# Patient Record
Sex: Male | Born: 1990 | Race: White | Hispanic: No | Marital: Single | Smoking: Current every day smoker
Health system: Southern US, Community
[De-identification: ages and names within clinical notes are randomized; demographics above are authoritative.]

## PROBLEM LIST (undated history)

## (undated) ENCOUNTER — Emergency Department (HOSPITAL_COMMUNITY): Discharge status: Against Medical Advice | Payer: No Typology Code available for payment source

## (undated) DIAGNOSIS — Z789 Other specified health status: Secondary | ICD-10-CM

## (undated) DIAGNOSIS — F1120 Opioid dependence, uncomplicated: Secondary | ICD-10-CM

## (undated) HISTORY — PX: WISDOM TOOTH EXTRACTION: SHX21

---
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
## 2009-03-07 ENCOUNTER — Emergency Department (HOSPITAL_COMMUNITY): Admission: EM | Admit: 2009-03-07 | Discharge: 2009-03-07 | Payer: Self-pay | Admitting: Emergency Medicine

## 2009-07-08 ENCOUNTER — Emergency Department (HOSPITAL_COMMUNITY): Admission: EM | Admit: 2009-07-08 | Discharge: 2009-07-08 | Payer: Self-pay | Admitting: Emergency Medicine

## 2009-07-13 ENCOUNTER — Emergency Department (HOSPITAL_COMMUNITY): Admission: EM | Admit: 2009-07-13 | Discharge: 2009-07-13 | Payer: Self-pay | Admitting: Emergency Medicine

## 2009-12-24 ENCOUNTER — Emergency Department (HOSPITAL_COMMUNITY): Admission: EM | Admit: 2009-12-24 | Discharge: 2009-12-24 | Payer: Self-pay | Admitting: Emergency Medicine

## 2009-12-30 ENCOUNTER — Emergency Department (HOSPITAL_COMMUNITY): Admission: EM | Admit: 2009-12-30 | Discharge: 2009-12-30 | Payer: Self-pay | Admitting: Emergency Medicine

## 2010-01-12 ENCOUNTER — Ambulatory Visit (HOSPITAL_BASED_OUTPATIENT_CLINIC_OR_DEPARTMENT_OTHER): Admission: RE | Admit: 2010-01-12 | Discharge: 2010-01-12 | Payer: Self-pay | Admitting: Orthopaedic Surgery

## 2010-02-28 ENCOUNTER — Emergency Department (HOSPITAL_COMMUNITY): Admission: EM | Admit: 2010-02-28 | Discharge: 2010-02-28 | Payer: Self-pay | Admitting: Emergency Medicine

## 2010-03-04 ENCOUNTER — Emergency Department (HOSPITAL_COMMUNITY): Admission: EM | Admit: 2010-03-04 | Discharge: 2010-03-04 | Payer: Self-pay | Admitting: Emergency Medicine

## 2010-06-06 ENCOUNTER — Emergency Department (HOSPITAL_COMMUNITY): Admission: EM | Admit: 2010-06-06 | Discharge: 2010-06-06 | Payer: Self-pay | Admitting: Emergency Medicine

## 2010-06-11 ENCOUNTER — Emergency Department (HOSPITAL_COMMUNITY): Admission: EM | Admit: 2010-06-11 | Discharge: 2010-06-11 | Payer: Self-pay | Admitting: Family Medicine

## 2010-10-21 ENCOUNTER — Emergency Department (HOSPITAL_COMMUNITY)
Admission: EM | Admit: 2010-10-21 | Discharge: 2010-10-21 | Payer: Self-pay | Source: Home / Self Care | Admitting: Emergency Medicine

## 2010-10-28 ENCOUNTER — Emergency Department (HOSPITAL_COMMUNITY)
Admission: EM | Admit: 2010-10-28 | Discharge: 2010-10-28 | Payer: Self-pay | Source: Home / Self Care | Admitting: Emergency Medicine

## 2010-11-01 LAB — GC/CHLAMYDIA PROBE AMP, GENITAL
Chlamydia, DNA Probe: NEGATIVE
GC Probe Amp, Genital: POSITIVE — AB

## 2010-11-01 LAB — RPR: RPR Ser Ql: NONREACTIVE

## 2011-01-09 LAB — POCT HEMOGLOBIN-HEMACUE: Hemoglobin: 16.2 g/dL (ref 13.0–17.0)

## 2011-03-04 NOTE — Op Note (Signed)
NAMEBLANDON, OFFERDAHL              ACCOUNT NO.:  [id number]   MEDICAL RECORD NO.:  [id number]          PATIENT TYPE:  EMS   LOCATION:  ED                           FACILITY:  [facility]   PHYSICIAN:  Dionne Ano. Gramig III, M.D.DATE OF BIRTH:  June 20, 1991   DATE OF PROCEDURE:  DATE OF DISCHARGE:                               OPERATIVE REPORT   __________  I had the pleasure to see Gerald Oliver upon the referal  from Dr. Doug Sou, emergency room staff [facility]. This  patient is a 20 year old male who sustained a laceration to his left  hand today, October 13, 2006.  The patient sustained a significant  laceration to the dorsal aspect of his left index finger sustaining an  extensor tendon injury and periosteal injury down into the joint.  He  complained of pain and discomfort.  Following this, he was evaluated,  given a tetanus shot as well as Ancef 1 gram IV and further care.  He  has an obvious extensor tendon disruption.   PAST MEDICAL HISTORY:  None.   PAST SURGICAL HISTORY:  Facial surgery.   CURRENT MEDICATIONS:  None.   ALLERGIES:  None.   SOCIAL HISTORY:  The patient is 20 years of age, he goes to school at  [employer].   PHYSICAL EXAMINATION:  He is alert and oriented in no acute distress.  Vital signs are stable.  Chest and abdomen are nontender and normal. The patient has no  tenderness over his C-spine, T-spine or L-spine and he walks with a  nonantalgic gait. The left hand is nontender at the elbow, humerus and  shoulder. His left index finger has an open laceration down to the  joint, the joint is exposed but there is no broken bones noted.  Extensor tendon is disrupted and there is a large flap tear about the  dorsal aspect of his hand.  I viewed this at length.   He has had no obvious bony fracture I should note.   IMPRESSION:  Open MCP joint with extensor tendon disruption and large  dorsal flap tearing injury to the skin.   PLAN:  I verbally  consented him for I&D and repair of structure as  necessary.   He understands the risk of bleeding, infection, anesthesia, damage to  normal structures and failure of the surgery to accomplish its intended  goals of relieving symptoms and restoring function.  With this in mind,  we will proceed accordingly.   PREOPERATIVE DIAGNOSIS:  Extensive laceration left index finger with  dorsal jagged flap, open metacarpophalangeal joint and extensor tendon  disruption.   POSTOPERATIVE DIAGNOSIS:  Extensive laceration left index finger with  dorsal jagged flap, open metacarpophalangeal joint and extensor tendon  disruption.   PROCEDURE:  1. I&D skin, subcutaneous tissue, tendon, periosteal tissue and MCP      joint as well as tendon tissue.  This was an excisional      debridement.  2. Open arthrotomy and debridement of the MCP joint with synovectomy      as necessary.  3. Repair extensor tendon laceration at the MCP  joint with 4-0      FiberWire, left index finger.  4. Complex flap repair/mobilization with rotation flap maneuver left      index finger.   SURGEON:  Dionne Ano. Amanda Pea, M.D.   ASSISTANT:  None.   COMPLICATIONS:  None.   ANESTHESIA:  Intermetacarpal block.   TOURNIQUET TIME:  Zero.   INDICATIONS FOR PROCEDURE:  The patient has been thoroughly evaluated,  the risks and benefits of surgery discussed and he desires to proceed.  He has an open laceration with tendon disruption and an open MCP joint.  He understands the risks and benefits of surgery and desires to proceed.   OPERATIVE PROCEDURE:  The patient was seen by myself and taken to the  procedure suite.  He was consented. He underwent a sterile prep and  drape with Betadine scrub and paint x3 separate scrubs, 3 liters of  irrigation was placed in the wound.  I should note that lidocaine 1%  without epinephrine was used __________  intermetacarpal block for  anesthesia.  Once I&D and prep were performed, I then  performed  excisional debridement of skin, subcutaneous tissue, periosteal tissue,  tendon and MCP joint tissue.  This was an incisional debridement and was  irrigated copiously.   Following this, the MCP joint was opened, limited synovectomy and  arthrotomy was accomplished which revealed no injury to the  cartilaginous tissue.  He was thoroughly irrigated, debrided and wound  was cleansed about the MCP joint to prevent infection.  This was copious  I&D.   Following this, the patient underwent repair of the extensor apparatus  with 4-0 FiberWire. He had an 8-10 strand repair without difficulty.  Following the repair with 4-0 FiberWire about the MCP joint region, I  then placed him through a full range of motion and demonstrated full  competent tendon reconstruction.  There was no subluxation or problems.   Following this, I obtained hemostasis with chromic suture tying off a  very large dorsal vein.  I then performed rotation flap with advancement  of the very large dorsal flap into the defect. The skin edges were  trimmed, mobilized and the rotation flap was inset due to the very large  and significant injury to the hand.   Following repair of the flap, he was then splinted with gauze and  sterile dressing with Xeroform and gauze were placed.  He tolerated this  well.   Next the patient had a fiberglass cast placed with the MCPs at 10  degrees of flexion, the wrist in extension and the PIPs mobilized in  extension.   He will be discharged home on Keflex as well as Vicodin p.r.n. pain with  precautions.  I have asked him not to drink alcohol, drive a car or make  important decisions on pain medicine. I have discussed with him Peri-  Colace use and will see him back in the office in 10-14 days for follow-  up for suture removal.  We will plan for 4 weeks of mobilization an gradual interval range of motion and therapy through [facility]  system.  I have discussed with the  patient the do's and don't's, etc.  and proper follow-up plans etc. All questions have been encouraged and  answered.           ______________________________  Dionne Ano. Everlene Other, M.D.     Nash Mantis  D:  10/13/2006  T:  10/14/2006  Job:  [id number]

## 2011-07-28 LAB — I-STAT 8, (EC8 V) (CONVERTED LAB)
Acid-base deficit: 2
BUN: 4 — ABNORMAL LOW
Bicarbonate: 24.8 — ABNORMAL HIGH
Chloride: 106
Glucose, Bld: 90
HCT: 51 — ABNORMAL HIGH
Hemoglobin: 17.3 — ABNORMAL HIGH
Operator id: 146091
Potassium: 4.1
Sodium: 139
TCO2: 26
pCO2, Ven: 50.1 — ABNORMAL HIGH
pH, Ven: 7.302 — ABNORMAL HIGH

## 2011-07-28 LAB — RAPID URINE DRUG SCREEN, HOSP PERFORMED
Amphetamines: NOT DETECTED
Barbiturates: NOT DETECTED
Benzodiazepines: POSITIVE — AB
Cocaine: NOT DETECTED
Opiates: NOT DETECTED
Tetrahydrocannabinol: POSITIVE — AB

## 2011-07-28 LAB — DRUG SCREEN PANEL (SERUM)
Amphetamine Scrn: NEGATIVE
Barbiturate Scrn: NEGATIVE
Benzodiazepine Scrn: NEGATIVE
Cannabinoid, Blood: POSITIVE
Cocaine (Metabolite): NEGATIVE
Methadone (Dolophine), Serum: NEGATIVE
Opiates, Blood: NEGATIVE
Phencyclidine, Serum: NEGATIVE
Propoxyphene,Serum: NEGATIVE

## 2011-07-28 LAB — CBC
HCT: 47.1
Hemoglobin: 16.2 — ABNORMAL HIGH
MCHC: 34.4
MCV: 84
Platelets: 255
RBC: 5.61
RDW: 13
WBC: 11 — ABNORMAL HIGH

## 2011-07-28 LAB — DIFFERENTIAL
Basophils Absolute: 0.1
Basophils Relative: 1
Eosinophils Absolute: 0.1
Eosinophils Relative: 1
Lymphocytes Relative: 13 — ABNORMAL LOW
Lymphs Abs: 1.4
Monocytes Absolute: 0.5
Monocytes Relative: 5
Neutro Abs: 8.9 — ABNORMAL HIGH
Neutrophils Relative %: 81 — ABNORMAL HIGH

## 2011-07-28 LAB — ETHANOL: Alcohol, Ethyl (B): <5

## 2011-07-28 LAB — ACETAMINOPHEN LEVEL: Acetaminophen (Tylenol), Serum: <10 — ABNORMAL LOW

## 2011-07-28 LAB — HEPATIC FUNCTION PANEL
ALT: 25
AST: 27
Albumin: 4.6
Alkaline Phosphatase: 99
Bilirubin, Direct: 0.2
Indirect Bilirubin: 1.4 — ABNORMAL HIGH
Total Bilirubin: 1.6 — ABNORMAL HIGH
Total Protein: 8

## 2011-07-28 LAB — SALICYLATE LEVEL: Salicylate Lvl: <4

## 2011-07-28 LAB — POCT I-STAT CREATININE
Creatinine, Ser: 1
Operator id: 146091

## 2011-12-14 ENCOUNTER — Emergency Department (HOSPITAL_COMMUNITY)
Admission: EM | Admit: 2011-12-14 | Discharge: 2011-12-14 | Disposition: A | Discharge status: Home / Self Care | Payer: Self-pay | Attending: Emergency Medicine | Admitting: Emergency Medicine

## 2011-12-14 ENCOUNTER — Encounter (HOSPITAL_COMMUNITY): Payer: Self-pay | Admitting: *Deleted

## 2011-12-14 ENCOUNTER — Emergency Department (HOSPITAL_COMMUNITY): Payer: Self-pay

## 2011-12-14 DIAGNOSIS — S61409A Unspecified open wound of unspecified hand, initial encounter: Secondary | ICD-10-CM | POA: Insufficient documentation

## 2011-12-14 DIAGNOSIS — S60221A Contusion of right hand, initial encounter: Secondary | ICD-10-CM

## 2011-12-14 DIAGNOSIS — W268XXA Contact with other sharp object(s), not elsewhere classified, initial encounter: Secondary | ICD-10-CM | POA: Insufficient documentation

## 2011-12-14 DIAGNOSIS — F17200 Nicotine dependence, unspecified, uncomplicated: Secondary | ICD-10-CM | POA: Insufficient documentation

## 2011-12-14 DIAGNOSIS — S60229A Contusion of unspecified hand, initial encounter: Secondary | ICD-10-CM | POA: Insufficient documentation

## 2011-12-14 DIAGNOSIS — S61411A Laceration without foreign body of right hand, initial encounter: Secondary | ICD-10-CM

## 2011-12-14 MED ORDER — IBUPROFEN 800 MG PO TABS: 800.0000 mg | ORAL_TABLET | Freq: Three times a day (TID) | ORAL | Status: AC | PRN

## 2011-12-14 NOTE — ED Notes (Signed)
Pt's right hand placed in sterile saline to soak for further exploration.

## 2011-12-14 NOTE — ED Notes (Signed)
Pt denies any questions upon discharge. 

## 2011-12-14 NOTE — Discharge Instructions (Signed)
Return to the ER immediately if you develop redness, swelling, pus draining from the wound, or fevers greater than 100.4.  You should be seen by your doctor, the urgent care, or the ER in 10-14 days for a wound recheck and suture removal.  You may return to the ER at any time for worsening condition or any new symptoms that concern you.    Contusion (Bruise) of Hand An injury to the hand may cause bruises (contusions). Contusions are caused by bleeding from small blood vessels (capillaries) that allow blood to leak out into the muscles, tendons, and surrounding soft tissue. This is followed by swelling and pain (inflammation). Contusions of the hand are common because of the use of hands in daily and recreational activities. Signs of a hand injury include pain, swelling, and a color change. Initially the skin may turn blue to purple in color. As the bruise ages, the color turns yellow and orange. Swelling may decrease the movement of the fingers. Contusions are seen more commonly with:  Contact sports (especially in football, wrestling, and basketball).   Use of medications that thin the blood (anticoagulants).   Use of aspirin and nonsteroidal anti-inflammatory agents that decrease the ability of the blood to clot.   Vitamin deficiencies.   Aging.  DIAGNOSIS  Diagnosis of hand injuries can be made by your own observation. If problems continue, a caregiver may be required for further evaluation and treatment. X-rays may be required to make sure there are no broken bones (fractures). Continued problems may require physical therapy for treatment. RISKS AND COMPLICATIONS  Extensive bleeding and tissue inflammation. This can lead to disability and arthritis-type problems later on if the hand does not heal properly.   Infection of the hand if there are breaks in the skin. This is especially true if the hand injury came from someone's teeth, such as would occur with punching someone in the mouth.  This can lead to an infection of the tendons and the membranes surrounding the tendons (sheaths). This infection can have severe complications including a loss of function (a "frozen" hand).   Rupture of the tendons requiring a surgical repair. Failure to repair the tendons can result in loss of function of the hand or fingers.  HOME CARE INSTRUCTIONS   Apply ice to the injury for 15 to 20 minutes, 3 to 4 times per day. Put the ice in a plastic bag and place a towel between the bag of ice and your skin.   An elastic bandage may be used initially for support and to minimize swelling. Do not wrap the hand too tightly. Do not sleep with the elastic bandage on.   Gentle massage from the fingertips towards the elbow will help keep the swelling down. Gently open and close your fist while doing this to maintain range of motion. Do this only after the first few days, when there is no or minimal pain.   Keep your hand above the level of the heart when swelling and pain are present. This will allow the fluid to drain out of the hand, decreasing the amount of swelling. This will improve healing time.   Try to avoid use of the injured hand (except for gentle range of motion) while the hand is hurting. Do not resume use until instructed by your caregiver. Then begin use gradually, do not increase use to the point of pain. If pain does develop, decrease use and continue the above measures, gradually increasing activities that do not cause  discomfort until you achieve normal use.   Only take over-the-counter or prescription medicines for pain, discomfort, or fever as directed by your caregiver.   Follow up with your caregiver as directed. Follow-up care may include orthopedic referrals, physical therapy, and rehabilitation. Any delay in obtaining necessary care could result in delayed healing, or temporary or permanent disability.  REHABILITATION  Begin daily rehabilitation exercises when an elastic bandage is  no longer needed and you are either pain free or only have minimal pain.   Use ice massage for 10 minutes before and after workouts. Put ice in a plastic bag and place a towel between the bag of ice and your skin. Massage the injured area with the ice pack.  SEEK IMMEDIATE MEDICAL CARE IF:   Your pain and swelling increase, or pain is uncontrolled with medications.   You have loss of feeling in your hand, or your hand turns cold or blue.   An oral temperature above 102 F (38.9 C) develops, not controlled by medication.   Your hand becomes warm to the touch, or you have increased pain with even slight movement of your fingers.   Your hand does not begin to improve in 1 or 2 days.   The skin is broken and signs of infection occur (fluid draining from the contusion, increasing pain, fever, headache, muscle aches, dizziness, or a general ill feeling).   You develop new, unexplained problems, or an increase of the symptoms that brought you to your caregiver.  MAKE SURE YOU:   Understand these instructions.   Will watch your condition.   Will get help right away if you are not doing well or get worse.  Document Released: 03/25/2002 Document Revised: 06/15/2011 Document Reviewed: 03/12/2010 [facility] Patient Information 2012 [place], [place].Laceration Care, Adult A laceration is a cut or lesion that goes through all layers of the skin and into the tissue just beneath the skin. TREATMENT  Some lacerations may not require closure. Some lacerations may not be able to be closed due to an increased risk of infection. It is important to see your caregiver as soon as possible after an injury to minimize the risk of infection and maximize the opportunity for successful closure. If closure is appropriate, pain medicines may be given, if needed. The wound will be cleaned to help prevent infection. Your caregiver will use stitches (sutures), staples, wound glue (adhesive), or skin adhesive strips to  repair the laceration. These tools bring the skin edges together to allow for faster healing and a better cosmetic outcome. However, all wounds will heal with a scar. Once the wound has healed, scarring can be minimized by covering the wound with sunscreen during the day for 1 full year. HOME CARE INSTRUCTIONS  For sutures or staples:  Keep the wound clean and dry.   If you were given a bandage (dressing), you should change it at least once a day. Also, change the dressing if it becomes wet or dirty, or as directed by your caregiver.   Wash the wound with soap and water 2 times a day. Rinse the wound off with water to remove all soap. Pat the wound dry with a clean towel.   After cleaning, apply a thin layer of the antibiotic ointment as recommended by your caregiver. This will help prevent infection and keep the dressing from sticking.   You may shower as usual after the first 24 hours. Do not soak the wound in water until the sutures are removed.  Only take over-the-counter or prescription medicines for pain, discomfort, or fever as directed by your caregiver.   Get your sutures or staples removed as directed by your caregiver.  For skin adhesive strips:  Keep the wound clean and dry.   Do not get the skin adhesive strips wet. You may bathe carefully, using caution to keep the wound dry.   If the wound gets wet, pat it dry with a clean towel.   Skin adhesive strips will fall off on their own. You may trim the strips as the wound heals. Do not remove skin adhesive strips that are still stuck to the wound. They will fall off in time.  For wound adhesive:  You may briefly wet your wound in the shower or bath. Do not soak or scrub the wound. Do not swim. Avoid periods of heavy perspiration until the skin adhesive has fallen off on its own. After showering or bathing, gently pat the wound dry with a clean towel.   Do not apply liquid medicine, cream medicine, or ointment medicine to your  wound while the skin adhesive is in place. This may loosen the film before your wound is healed.   If a dressing is placed over the wound, be careful not to apply tape directly over the skin adhesive. This may cause the adhesive to be pulled off before the wound is healed.   Avoid prolonged exposure to sunlight or tanning lamps while the skin adhesive is in place. Exposure to ultraviolet light in the first year will darken the scar.   The skin adhesive will usually remain in place for 5 to 10 days, then naturally fall off the skin. Do not pick at the adhesive film.  You may need a tetanus shot if:  You cannot remember when you had your last tetanus shot.   You have never had a tetanus shot.  If you get a tetanus shot, your arm may swell, get red, and feel warm to the touch. This is common and not a problem. If you need a tetanus shot and you choose not to have one, there is a rare chance of getting tetanus. Sickness from tetanus can be serious. SEEK MEDICAL CARE IF:   You have redness, swelling, or increasing pain in the wound.   You see a red line that goes away from the wound.   You have yellowish-white fluid (pus) coming from the wound.   You have a fever.   You notice a bad smell coming from the wound or dressing.   Your wound breaks open before or after sutures have been removed.   You notice something coming out of the wound such as wood or glass.   Your wound is on your hand or foot and you cannot move a finger or toe.  SEEK IMMEDIATE MEDICAL CARE IF:   Your pain is not controlled with prescribed medicine.   You have severe swelling around the wound causing pain and numbness or a change in color in your arm, hand, leg, or foot.   Your wound splits open and starts bleeding.   You have worsening numbness, weakness, or loss of function of any joint around or beyond the wound.   You develop painful lumps near the wound or on the skin anywhere on your body.  MAKE SURE YOU:     Understand these instructions.   Will watch your condition.   Will get help right away if you are not doing well or get worse.  Document Released: 10/03/2005 Document Revised: 06/15/2011 Document Reviewed: 03/29/2011 [facility] Patient Information 2012 [place], [place].

## 2011-12-14 NOTE — ED Notes (Signed)
Pt reports hitting right hand on object last night and has abrasion and swelling to right middle knuckle.

## 2011-12-14 NOTE — ED Provider Notes (Signed)
History     CSN: [id number]  Arrival date & time 12/14/11  1621   First MD Initiated Contact with Patient 12/14/11 1709      Chief Complaint  Patient presents with  . Hand Injury    (Consider location/radiation/quality/duration/timing/severity/associated sxs/prior treatment) HPI Comments: Patient reports that last night he was "swinging (his) hands around" and hit a light fixture.  States the lightbulb broke and cut his hand.  Patient has since had increased pain and swelling in his right third knuckle.  Denies weakness, numbness, tingling of the fingers. Denies other injury.    The history is provided by the patient.    History reviewed. No pertinent past medical history.  History reviewed. No pertinent past surgical history.  History reviewed. No pertinent family history.  History  Substance Use Topics  . Smoking status: Current Everyday Smoker    Types: Cigarettes  . Smokeless tobacco: Not on file  . Alcohol Use: Yes      Review of Systems  Musculoskeletal: Negative for back pain.  Neurological: Negative for syncope, weakness, numbness and headaches.  All other systems reviewed and are negative.    Allergies  Review of patient's allergies indicates no known allergies.  Home Medications  No current outpatient prescriptions on file.  BP 133/80  Pulse 86  Temp(Src) 97.5 F (36.4 C) (Oral)  Resp 16  SpO2 97%  Physical Exam  Nursing note and vitals reviewed. Constitutional: He is oriented to person, place, and time. He appears well-developed and well-nourished.  HENT:  Head: Normocephalic and atraumatic.  Neck: Neck supple.  Cardiovascular: Normal rate, regular rhythm and normal heart sounds.   Pulmonary/Chest: Breath sounds normal. No respiratory distress. He has no wheezes. He has no rales. He exhibits no tenderness.  Abdominal: Soft. Bowel sounds are normal. There is no tenderness.  Musculoskeletal:       Hands:      Capilllary refill < 2 seconds,  pt with full extension and flexion of fingers, strength 5/5, sensation intact.   Neurological: He is alert and oriented to person, place, and time.    ED Course  Procedures (including critical care time)  Labs Reviewed - No data to display Dg Hand Complete Right  12/14/2011  *RADIOLOGY REPORT*  Clinical Data: Laceration of the hand with glass.  Pain and swelling.  RIGHT HAND - COMPLETE 3+ VIEW  Comparison: 02/28/2010  Findings: Healed fourth and fifth metacarpal fractures noted.  Dorsal soft tissue swelling noted at the level of the metacarpal phalangeal joints. A thin foreign body measuring 1 mm in length is present along the superficial soft tissues in the vicinity of this swelling, and could represent a tiny superficial glass shard superficial to the third MCP joint dorsally.  IMPRESSION:  1.  Faint 1 mm in length density along the superficial soft tissues dorsal to the third MCP joint may represent a tiny sliver of glass.  Original Report Authenticated By: Dellia Cloud, M.D.   Filed Vitals:   12/14/11 1750  BP: 133/80  Pulse: 86  Temp:   Resp:     LACERATION REPAIR Performed by: Rise Patience supervising Emilia Beck, PA-S Consent: Verbal consent obtained. Risks and benefits: risks, benefits and alternatives were discussed Patient identity confirmed: provided demographic data Time out performed prior to procedure Prepped and Draped in normal sterile fashion Wound explored  Laceration Location: right third MCP, dorsal aspect  Laceration Length: 1.5cm  No Foreign Bodies seen or palpated, patient's hand cleaned thoroughly with lavage followed  be extensive irrigation with normal saline  Anesthesia: local infiltration  Local anesthetic: lidocaine 2% no epinephrine  Anesthetic total: 4 ml  Irrigation method: syringe Amount of cleaning: standard  Skin closure: 5-0 prolene  Number of sutures or staples: 3  Technique: simple interrupted  Patient tolerance:  Patient tolerated the procedure well with no immediate complications.     1. Laceration of right hand   2. Contusion of right hand       MDM  Patient with injury to his right hand yesterday on glass object.  Xray showed 1mm glass piece.  Nurse states she saw it clearly prior to patient soaking hand, I did not see or palpate any foreign body on exam.  Patient advised that there may be a small retained foreign body that may need excision at some point in the future, though I believe we most likely got it out with lavage and irrigation. Small laceration repaired in ED.  Pt d/c home with precautions, reasons for immediate return.  Pt to return in 10-14 days for wound check and suture removal.  Patient verbalizes understanding and agrees with plan.            Dillard Cannon [place], [place] 12/14/11 2254

## 2011-12-15 NOTE — ED Provider Notes (Signed)
Medical screening examination/treatment/procedure(s) were performed by non-physician practitioner and as supervising physician I was immediately available for consultation/collaboration.  Arantza Darrington, MD 12/15/11 0705 

## 2012-04-11 ENCOUNTER — Emergency Department (HOSPITAL_COMMUNITY): Payer: Self-pay

## 2012-04-11 ENCOUNTER — Emergency Department (HOSPITAL_COMMUNITY)
Admission: EM | Admit: 2012-04-11 | Discharge: 2012-04-11 | Disposition: A | Discharge status: Home / Self Care | Payer: Self-pay | Attending: Emergency Medicine | Admitting: Emergency Medicine

## 2012-04-11 DIAGNOSIS — F17200 Nicotine dependence, unspecified, uncomplicated: Secondary | ICD-10-CM | POA: Insufficient documentation

## 2012-04-11 DIAGNOSIS — Y92410 Unspecified street and highway as the place of occurrence of the external cause: Secondary | ICD-10-CM | POA: Insufficient documentation

## 2012-04-11 DIAGNOSIS — IMO0002 Reserved for concepts with insufficient information to code with codable children: Secondary | ICD-10-CM | POA: Insufficient documentation

## 2012-04-11 DIAGNOSIS — F1010 Alcohol abuse, uncomplicated: Secondary | ICD-10-CM | POA: Insufficient documentation

## 2012-04-11 DIAGNOSIS — T148XXA Other injury of unspecified body region, initial encounter: Secondary | ICD-10-CM

## 2012-04-11 DIAGNOSIS — F10929 Alcohol use, unspecified with intoxication, unspecified: Secondary | ICD-10-CM

## 2012-04-11 NOTE — Discharge Instructions (Signed)
Alcohol Intoxication You have alcohol intoxication when the amount of alcohol that you have consumed has impaired your ability to mentally and physically function. There are a variety of factors that contribute to the level at which alcohol intoxication can occur, such as age, gender, weight, frequency of alcohol consumption, medication use, and the presence of other medical conditions, such as diabetes, seizures, or heart conditions. The blood alcohol level test measures the concentration of alcohol in your blood. In most states, your blood alcohol level must be lower than 80 mg/dL (2.95%) to legally drive. However, many dangerous effects of alcohol can occur at much lower levels. Alcohol directly impairs the normal chemical activity of the brain and is said to be a chemical depressant. Alcohol can cause drowsiness, stupor, respiratory failure, and coma. Other physical effects can include headache, vomiting, vomiting of blood, abdominal pain, a fast heartbeat, difficulty breathing, anxiety, and amnesia. Alcohol intoxication can also lead to dangerous and life-threatening activities, such as fighting, dangerous operation of vehicles or heavy machinery, and risky sexual behavior. Alcohol can be especially dangerous when taken with other drugs. Some of these drugs are:  Sedatives.   Painkillers.   Marijuana.   Tranquilizers.   Antihistamines.   Muscle relaxants.   Seizure medicine.  Many of the effects of acute alcohol intoxication are temporary. However, repeated alcohol intoxication can lead to severe medical illnesses. If you have alcohol intoxication, you should:  Stay hydrated. Drink enough water and fluids to keep your urine clear or pale yellow. Avoid excessive caffeine because this can further lead to dehydration.   Eat a healthy diet. You may have residual nausea, headache, and loss of appetite, but it is still important that you maintain good nutrition. You can start with clear  liquids.   Take nonsteroidal anti-inflammatory medications as needed for headaches, but make sure to do so with small meals. You should avoid acetaminophen for several days after having alcohol intoxication because the combination of alcohol and acetaminophen can be toxic to your liver.  If you have frequent alcohol intoxication, ask your friends and family if they think you have a drinking problem. For further help, contact:  Your caregiver.   Alcoholics Anonymous (AA).   A drug or alcohol rehabilitation program.  SEEK MEDICAL CARE IF:   You have persistent vomiting.   You have persistent pain in any part of your body.   You do not feel better after a few days.  SEEK IMMEDIATE MEDICAL CARE IF:   You become shaky or tremble when you try to stop drinking.   You shake uncontrollably (seizure).   You throw up (vomit) blood. This may be bright red or it may look like black coffee grounds.   You have blood in the stool. This may be bright red or appear as a black, tarry, bad smelling stool.   You become lightheaded or faint.  ANY OF THESE SYMPTOMS MAY REPRESENT A SERIOUS PROBLEM THAT IS AN EMERGENCY. Do not wait to see if the symptoms will go away. Get medical help right away. Call your local emergency services (911 in [place] DO NOT drive yourself to the hospital. MAKE SURE YOU:   Understand these instructions.   Will watch your condition.   Will get help right away if you are not doing well or get worse.  Document Released: 07/13/2005 Document Revised: 09/22/2011 Document Reviewed: 03/22/2010 [facility] Patient Information 2012 [place], [place].Abrasions An abrasion is a scraped area on the skin. Abrasions do not go through all  layers of the skin.  HOME CARE  Change any bandages (dressings) as told by your doctor. If the bandage sticks, soak it off with warm, soapy water. Change the bandage if it gets wet, dirty, or starts to smell.   Wash the area with soap and water twice a day.  Rinse off the soap. Pat the area dry with a clean towel.   Look at the injured area for signs of infection. Infection signs include redness, puffiness (swelling), tenderness, or yellowish white fluid (pus) coming from the wound.   Apply medicated cream as told by your doctor.   Only take medicine as told by your doctor.   Follow up with your doctor as told.  GET HELP RIGHT AWAY IF:   You have more pain in your wound.   You have redness, puffiness (swelling), or tenderness around your wound.   You have yellowish white fluid (pus) coming from your wound.   You have a fever.   A bad smell is coming from the wound or bandage.  MAKE SURE YOU:   Understand these instructions.   Will watch your condition.   Will get help right away if you are not doing well or get worse.  Document Released: 03/21/2008 Document Revised: 09/22/2011 Document Reviewed: 09/06/2011 [facility] Patient Information 2012 [place], [place].
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
## 2012-04-11 NOTE — ED Provider Notes (Signed)
I saw and evaluated the patient, reviewed the resident's note and I agree with the findings and plan.   Kazuma Elena, MD 04/11/12 2352 

## 2012-06-14 ENCOUNTER — Encounter (HOSPITAL_COMMUNITY): Payer: Self-pay | Admitting: *Deleted

## 2012-06-14 ENCOUNTER — Emergency Department (HOSPITAL_COMMUNITY): Payer: No Typology Code available for payment source

## 2012-06-14 ENCOUNTER — Emergency Department (HOSPITAL_COMMUNITY)
Admission: EM | Admit: 2012-06-14 | Discharge: 2012-06-15 | Disposition: A | Discharge status: Home / Self Care | Payer: No Typology Code available for payment source | Attending: Emergency Medicine | Admitting: Emergency Medicine

## 2012-06-14 DIAGNOSIS — S161XXA Strain of muscle, fascia and tendon at neck level, initial encounter: Secondary | ICD-10-CM

## 2012-06-14 DIAGNOSIS — S139XXA Sprain of joints and ligaments of unspecified parts of neck, initial encounter: Secondary | ICD-10-CM | POA: Insufficient documentation

## 2012-06-14 DIAGNOSIS — M542 Cervicalgia: Secondary | ICD-10-CM | POA: Insufficient documentation

## 2012-06-14 DIAGNOSIS — M545 Low back pain, unspecified: Secondary | ICD-10-CM | POA: Insufficient documentation

## 2012-06-14 DIAGNOSIS — Y92410 Unspecified street and highway as the place of occurrence of the external cause: Secondary | ICD-10-CM | POA: Insufficient documentation

## 2012-06-14 DIAGNOSIS — F17200 Nicotine dependence, unspecified, uncomplicated: Secondary | ICD-10-CM | POA: Insufficient documentation
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
## 2012-06-15 ENCOUNTER — Emergency Department (HOSPITAL_COMMUNITY): Payer: No Typology Code available for payment source

## 2012-06-15 MED ORDER — DIAZEPAM 5 MG PO TABS
5.0000 mg | ORAL_TABLET | Freq: Once | ORAL | Status: AC
  Administered 2012-06-15: 5 mg via ORAL
  Filled 2012-06-15: qty 1

## 2012-06-15 MED ORDER — KETOROLAC TROMETHAMINE 60 MG/2ML IM SOLN
60.0000 mg | Freq: Once | INTRAMUSCULAR | Status: AC
  Administered 2012-06-15: 60 mg via INTRAMUSCULAR
  Filled 2012-06-15: qty 2

## 2012-06-15 MED ORDER — OXYCODONE-ACETAMINOPHEN 5-325 MG PO TABS
1.0000 | ORAL_TABLET | Freq: Once | ORAL | Status: AC
  Administered 2012-06-15: 1 via ORAL
  Filled 2012-06-15: qty 1

## 2012-06-15 MED ORDER — NAPROXEN 500 MG PO TABS: 500.0000 mg | ORAL_TABLET | Freq: Two times a day (BID) | ORAL | Status: DC

## 2012-06-15 MED ORDER — CYCLOBENZAPRINE HCL 10 MG PO TABS: 10.0000 mg | ORAL_TABLET | Freq: Two times a day (BID) | ORAL | Status: AC | PRN
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
## 2012-06-15 NOTE — ED Notes (Signed)
Patient transported to X-ray 

## 2012-07-10 ENCOUNTER — Encounter (HOSPITAL_COMMUNITY): Payer: Self-pay | Admitting: Emergency Medicine

## 2012-07-10 ENCOUNTER — Emergency Department (HOSPITAL_COMMUNITY): Payer: Self-pay

## 2012-07-10 ENCOUNTER — Emergency Department (HOSPITAL_COMMUNITY)
Admission: EM | Admit: 2012-07-10 | Discharge: 2012-07-10 | Disposition: A | Discharge status: Home / Self Care | Payer: Self-pay | Attending: Emergency Medicine | Admitting: Emergency Medicine

## 2012-07-10 DIAGNOSIS — S61019A Laceration without foreign body of unspecified thumb without damage to nail, initial encounter: Secondary | ICD-10-CM

## 2012-07-10 DIAGNOSIS — Z23 Encounter for immunization: Secondary | ICD-10-CM | POA: Insufficient documentation

## 2012-07-10 DIAGNOSIS — F17200 Nicotine dependence, unspecified, uncomplicated: Secondary | ICD-10-CM | POA: Insufficient documentation

## 2012-07-10 DIAGNOSIS — S61209A Unspecified open wound of unspecified finger without damage to nail, initial encounter: Secondary | ICD-10-CM | POA: Insufficient documentation

## 2012-07-10 DIAGNOSIS — W268XXA Contact with other sharp object(s), not elsewhere classified, initial encounter: Secondary | ICD-10-CM | POA: Insufficient documentation

## 2012-07-10 MED ORDER — IBUPROFEN 200 MG PO TABS
600.0000 mg | ORAL_TABLET | Freq: Once | ORAL | Status: AC
  Administered 2012-07-10: 600 mg via ORAL
  Filled 2012-07-10: qty 1

## 2012-07-10 MED ORDER — LIDOCAINE-EPINEPHRINE-TETRACAINE (LET) SOLUTION
3.0000 mL | Freq: Once | NASAL | Status: AC
  Administered 2012-07-10: 3 mL via TOPICAL
  Filled 2012-07-10 (×2): qty 3

## 2012-07-10 MED ORDER — TETANUS-DIPHTH-ACELL PERTUSSIS 5-2.5-18.5 LF-MCG/0.5 IM SUSP
0.5000 mL | Freq: Once | INTRAMUSCULAR | Status: AC
  Administered 2012-07-10: 0.5 mL via INTRAMUSCULAR
  Filled 2012-07-10: qty 0.5

## 2012-07-10 MED ORDER — ACETAMINOPHEN-CODEINE #3 300-30 MG PO TABS
1.0000 | ORAL_TABLET | Freq: Four times a day (QID) | ORAL | Status: DC | PRN
Start: 2012-07-10 — End: 2013-02-19
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
## 2012-07-10 NOTE — ED Notes (Signed)
Patient washed hands with water and soap, cleaned right thumb with wound cleanser, and placed L.e.t with cotton ball and q-tip. Patient tolerated procedure with out incident.

## 2012-07-10 NOTE — Discharge Instructions (Signed)
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
## 2013-01-05 ENCOUNTER — Emergency Department (HOSPITAL_COMMUNITY)
Admission: EM | Admit: 2013-01-05 | Discharge: 2013-01-05 | Disposition: A | Discharge status: Home / Self Care | Payer: Self-pay | Attending: Emergency Medicine | Admitting: Emergency Medicine

## 2013-01-05 ENCOUNTER — Encounter (HOSPITAL_COMMUNITY): Payer: Self-pay | Admitting: *Deleted

## 2013-01-05 DIAGNOSIS — F1010 Alcohol abuse, uncomplicated: Secondary | ICD-10-CM | POA: Insufficient documentation

## 2013-01-05 DIAGNOSIS — IMO0002 Reserved for concepts with insufficient information to code with codable children: Secondary | ICD-10-CM | POA: Insufficient documentation

## 2013-01-05 DIAGNOSIS — X58XXXA Exposure to other specified factors, initial encounter: Secondary | ICD-10-CM | POA: Insufficient documentation

## 2013-01-05 DIAGNOSIS — R1110 Vomiting, unspecified: Secondary | ICD-10-CM | POA: Insufficient documentation

## 2013-01-05 DIAGNOSIS — Y939 Activity, unspecified: Secondary | ICD-10-CM | POA: Insufficient documentation

## 2013-01-05 DIAGNOSIS — Y929 Unspecified place or not applicable: Secondary | ICD-10-CM | POA: Insufficient documentation

## 2013-01-05 DIAGNOSIS — F10929 Alcohol use, unspecified with intoxication, unspecified: Secondary | ICD-10-CM

## 2013-01-05 DIAGNOSIS — F17200 Nicotine dependence, unspecified, uncomplicated: Secondary | ICD-10-CM | POA: Insufficient documentation

## 2013-01-05 MED ORDER — ONDANSETRON 8 MG PO TBDP: 8.0000 mg | ORAL_TABLET | Freq: Once | ORAL | Status: DC
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
## 2013-01-05 NOTE — ED Notes (Signed)
[id number]<IO> Expected date:<BR> Expected time:<BR> Means of arrival:<BR> Comments:<BR> Blood draw
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
## 2013-01-05 NOTE — ED Notes (Signed)
Pt refusing ODT

## 2013-01-06 ENCOUNTER — Telehealth (HOSPITAL_COMMUNITY): Payer: Self-pay | Admitting: Emergency Medicine

## 2013-01-07 NOTE — ED Provider Notes (Signed)
Medical screening examination/treatment/procedure(s) were performed by non-physician practitioner and as supervising physician I was immediately available for consultation/collaboration.   Yavuz Kirby L Anneth Brunell, MD 01/07/13 0553 

## 2013-02-19 ENCOUNTER — Emergency Department (HOSPITAL_COMMUNITY)
Admission: EM | Admit: 2013-02-19 | Discharge: 2013-02-19 | Disposition: A | Discharge status: Home / Self Care | Payer: Self-pay | Attending: Emergency Medicine | Admitting: Emergency Medicine

## 2013-02-19 ENCOUNTER — Encounter (HOSPITAL_COMMUNITY): Payer: Self-pay | Admitting: *Deleted

## 2013-02-19 DIAGNOSIS — R5383 Other fatigue: Secondary | ICD-10-CM | POA: Insufficient documentation

## 2013-02-19 DIAGNOSIS — W57XXXA Bitten or stung by nonvenomous insect and other nonvenomous arthropods, initial encounter: Secondary | ICD-10-CM

## 2013-02-19 DIAGNOSIS — Y939 Activity, unspecified: Secondary | ICD-10-CM | POA: Insufficient documentation

## 2013-02-19 DIAGNOSIS — S90569A Insect bite (nonvenomous), unspecified ankle, initial encounter: Secondary | ICD-10-CM | POA: Insufficient documentation

## 2013-02-19 DIAGNOSIS — F17200 Nicotine dependence, unspecified, uncomplicated: Secondary | ICD-10-CM | POA: Insufficient documentation

## 2013-02-19 DIAGNOSIS — R5381 Other malaise: Secondary | ICD-10-CM | POA: Insufficient documentation

## 2013-02-19 DIAGNOSIS — Y929 Unspecified place or not applicable: Secondary | ICD-10-CM | POA: Insufficient documentation

## 2013-02-19 DIAGNOSIS — R110 Nausea: Secondary | ICD-10-CM | POA: Insufficient documentation

## 2013-02-19 LAB — GLUCOSE, CAPILLARY

## 2013-02-19 LAB — CBC
HCT: 39.3 % (ref 39.0–52.0)
Hemoglobin: 14.4 g/dL (ref 13.0–17.0)
MCH: 29 pg (ref 26.0–34.0)
RBC: 4.97 MIL/uL (ref 4.22–5.81)

## 2013-02-19 LAB — BASIC METABOLIC PANEL
BUN: 11 mg/dL (ref 6–23)
CO2: 30 mEq/L (ref 19–32)
Glucose, Bld: 91 mg/dL (ref 70–99)
Potassium: 4.1 mEq/L (ref 3.5–5.1)
Sodium: 138 mEq/L (ref 135–145)

## 2013-02-19 MED ORDER — ONDANSETRON 4 MG PO TBDP
8.0000 mg | ORAL_TABLET | Freq: Once | ORAL | Status: AC
  Administered 2013-02-19: 8 mg via ORAL
  Filled 2013-02-19: qty 2

## 2013-02-19 MED ORDER — IBUPROFEN 800 MG PO TABS
800.0000 mg | ORAL_TABLET | Freq: Once | ORAL | Status: AC
  Administered 2013-02-19: 800 mg via ORAL
  Filled 2013-02-19: qty 1

## 2013-02-19 MED ORDER — DOXYCYCLINE HYCLATE 100 MG PO CAPS: 100.0000 mg | ORAL_CAPSULE | Freq: Two times a day (BID) | ORAL | Status: DC

## 2013-02-19 MED ORDER — IBUPROFEN 800 MG PO TABS: 800.0000 mg | ORAL_TABLET | Freq: Three times a day (TID) | ORAL | Status: DC

## 2013-02-19 MED ORDER — PROMETHAZINE HCL 25 MG PO TABS: 25.0000 mg | ORAL_TABLET | Freq: Four times a day (QID) | ORAL | Status: DC | PRN

## 2013-02-19 MED ORDER — DOXYCYCLINE HYCLATE 100 MG PO TABS
100.0000 mg | ORAL_TABLET | Freq: Once | ORAL | Status: AC
  Administered 2013-02-19: 100 mg via ORAL
  Filled 2013-02-19: qty 1

## 2013-02-19 NOTE — ED Notes (Signed)
Pt states he has 2 tick bites one week ago.  One was under his butt cheek and near ankle and thinks it was deer ticks.  Pt states weakness, hot flashes, and body aches.

## 2013-02-19 NOTE — ED Notes (Signed)
POCT CBG performed; RN notified 

## 2013-02-19 NOTE — ED Notes (Signed)
Explained to pt. That we will call if his lab work comes back positive.  Reviewed discharge instructions with pt. Pt. Verbalized understanding.  Pt. Would not sign discharge instuctions.

## 2013-02-19 NOTE — ED Provider Notes (Signed)
History     CSN: [id number]  Arrival date & time 02/19/13  1231   First MD Initiated Contact with Patient 02/19/13 1354      Chief Complaint  Patient presents with  . Weakness  . Nausea  . Tick Removal    (Consider location/radiation/quality/duration/timing/severity/associated sxs/prior treatment) HPI Comments: Patient is a 22 year old male with no significant past medical history who presents after a tick bite that occurred about 1 week ago. Patient reports have gradual onset of weakness and nausea since the tick bite. Patient reports removing the ticks. No aggravating/alleviating factors. No other associated symptoms. Patient denies rash and fever.   Patient is a 22 y.o. male presenting with weakness.  Weakness Associated symptoms include nausea and weakness.    History reviewed. No pertinent past medical history.  History reviewed. No pertinent past surgical history.  No family history on file.  History  Substance Use Topics  . Smoking status: Current Every Day Smoker    Types: Cigarettes  . Smokeless tobacco: Not on file  . Alcohol Use: No      Review of Systems  Gastrointestinal: Positive for nausea.  Skin: Positive for wound.  Neurological: Positive for weakness.  All other systems reviewed and are negative.    Allergies  Review of patient's allergies indicates no known allergies.  Home Medications  No current outpatient prescriptions on file.  BP 126/81  Pulse 109  Temp(Src) 98.6 F (37 C) (Oral)  Resp 18  SpO2 99%  Physical Exam  Nursing note and vitals reviewed. Constitutional: He is oriented to person, place, and time. He appears well-developed and well-nourished. No distress.  HENT:  Head: Normocephalic and atraumatic.  Eyes: Conjunctivae are normal.  Neck: Normal range of motion.  Cardiovascular: Normal rate and regular rhythm.  Exam reveals no gallop and no friction rub.   No murmur heard. Pulmonary/Chest: Effort normal and breath  sounds normal. He has no wheezes. He has no rales. He exhibits no tenderness.  Abdominal: Soft. He exhibits no distension. There is no tenderness. There is no rebound.  Musculoskeletal: Normal range of motion.  Neurological: He is alert and oriented to person, place, and time. Coordination normal.  Speech is goal-oriented. Moves limbs without ataxia.   Skin: Skin is warm and dry.  Tick bite over posterior right ankle and right buttock. Mild surrounding erythema. No petechial or maculopapular rash.   Psychiatric: He has a normal mood and affect. His behavior is normal.    ED Course  Procedures (including critical care time)  Labs Reviewed  CBC - Abnormal; Notable for the following:    MCHC 36.6 (*)    All other components within normal limits  BASIC METABOLIC PANEL - Abnormal; Notable for the following:    GFR calc non Af Amer 82 (*)    All other components within normal limits  GLUCOSE, CAPILLARY  ROCKY MTN SPOTTED FVR AB, IGG-BLOOD  ROCKY MTN SPOTTED FVR AB, IGM-BLOOD   No results found.   1. Tick bite       MDM  2:09 PM Labs unremarkable. Patient will have ibuprofen and zofran here. Patient will be started on doxycycline for possible RMSF. Vitals stable and patient afebrile. Patient instructed to follow up with a medical provider from the resource guide.        Emilia Beck, PA-C 02/19/13 1503

## 2013-02-19 NOTE — ED Provider Notes (Signed)
Medical screening examination/treatment/procedure(s) were performed by non-physician practitioner and as supervising physician I was immediately available for consultation/collaboration.   Bernadette Gores, MD 02/19/13 1519 

## 2013-02-19 NOTE — ED Notes (Signed)
Pt reports nausea and no appetite

## 2013-02-22 ENCOUNTER — Encounter (HOSPITAL_COMMUNITY): Payer: Self-pay

## 2013-02-22 ENCOUNTER — Emergency Department (HOSPITAL_COMMUNITY)
Admission: EM | Admit: 2013-02-22 | Discharge: 2013-02-22 | Disposition: A | Discharge status: Home / Self Care | Payer: Self-pay | Attending: Emergency Medicine | Admitting: Emergency Medicine

## 2013-02-22 DIAGNOSIS — S61219A Laceration without foreign body of unspecified finger without damage to nail, initial encounter: Secondary | ICD-10-CM

## 2013-02-22 DIAGNOSIS — W260XXA Contact with knife, initial encounter: Secondary | ICD-10-CM | POA: Insufficient documentation

## 2013-02-22 DIAGNOSIS — Y990 Civilian activity done for income or pay: Secondary | ICD-10-CM | POA: Insufficient documentation

## 2013-02-22 DIAGNOSIS — Z791 Long term (current) use of non-steroidal anti-inflammatories (NSAID): Secondary | ICD-10-CM | POA: Insufficient documentation

## 2013-02-22 DIAGNOSIS — Y9389 Activity, other specified: Secondary | ICD-10-CM | POA: Insufficient documentation

## 2013-02-22 DIAGNOSIS — Y9289 Other specified places as the place of occurrence of the external cause: Secondary | ICD-10-CM | POA: Insufficient documentation

## 2013-02-22 DIAGNOSIS — S61209A Unspecified open wound of unspecified finger without damage to nail, initial encounter: Secondary | ICD-10-CM | POA: Insufficient documentation

## 2013-02-22 DIAGNOSIS — F17200 Nicotine dependence, unspecified, uncomplicated: Secondary | ICD-10-CM | POA: Insufficient documentation

## 2013-02-22 MED ORDER — TRAMADOL HCL 50 MG PO TABS: 50.0000 mg | ORAL_TABLET | Freq: Four times a day (QID) | ORAL | Status: DC | PRN

## 2013-02-22 NOTE — ED Notes (Addendum)
Pt presents with 2 lacerations, one on the tip of his right index finger, one on the tip of his middle finger. Pt was using a tree knife and the knife slipped and cut his fingers. Pt was also seen recently at [facility] for multiple tic bites. Worried about rocky mountain spotted fever. Says he is feeling weak

## 2013-02-22 NOTE — ED Notes (Signed)
Pt verbalizes understanding.  Pt denies numbness or tingling to right fingers

## 2013-02-22 NOTE — ED Provider Notes (Signed)
History    This chart was scribed for Magnus Sinning, PA working with Gerhard Munch, MD by ED Scribe, Burman Nieves. This patient was seen in room WTR6/WTR6 and the patient's care was started at 3:52 PM.   CSN: [id number]  Arrival date & time 02/22/13  1307   First MD Initiated Contact with Patient 02/22/13 1552      Chief Complaint  Patient presents with  . Extremity Laceration    (Consider location/radiation/quality/duration/timing/severity/associated sxs/prior treatment) The history is provided by the patient. No language interpreter was used.   HPI Comments: Gerald Oliver is a 22 y.o. male who presents to the Emergency Department complaining of 2 lacerations, one on the tip of his right index finger and one on the tip of his right middle finger, which occurred yesterday approximately 26 hours ago.  Bleeding is currently under control in the ED. Pt states that he was working with a tree knife when the blade slipped resulting in cutting his fingers. Pt has been seen previously for tic bites at [facility] and is worried he may have rocky mountain spotted fever due to feeling weak. Pt denies headache, rash, fever, chills, cough, nausea, vomiting, diarrhea, SOB, weakness, and any other associated symptoms.  Denies rash at this time.  Pt is a current everyday smoker   Pt is not sure when his last tetanus shot was.  History reviewed. No pertinent past medical history.  History reviewed. No pertinent past surgical history.  No family history on file.  History  Substance Use Topics  . Smoking status: Current Every Day Smoker    Types: Cigarettes  . Smokeless tobacco: Not on file  . Alcohol Use: No      Review of Systems  Skin: Positive for wound.  Neurological: Negative for numbness.    Allergies  Review of patient's allergies indicates no known allergies.  Home Medications   Current Outpatient Rx  Name  Route  Sig  Dispense  Refill  . doxycycline (VIBRAMYCIN) 100 MG  capsule   Oral   Take 1 capsule (100 mg total) by mouth 2 (two) times daily.   20 capsule   0   . ibuprofen (ADVIL,MOTRIN) 800 MG tablet   Oral   Take 1 tablet (800 mg total) by mouth 3 (three) times daily.   21 tablet   0   . promethazine (PHENERGAN) 25 MG tablet   Oral   Take 1 tablet (25 mg total) by mouth every 6 (six) hours as needed for nausea.   12 tablet   0     BP 140/84  Pulse 92  Temp(Src) 99 F (37.2 C) (Oral)  Resp 16  SpO2 98%  Physical Exam  Nursing note and vitals reviewed. Constitutional: He appears well-developed and well-nourished.  HENT:  Head: Normocephalic and atraumatic.  Mouth/Throat: Oropharynx is clear and moist.  Eyes: Conjunctivae and EOM are normal. Pupils are equal, round, and reactive to light.  Neck: Normal range of motion. Neck supple.  Cardiovascular: Normal rate, regular rhythm and normal heart sounds.   Pulmonary/Chest: Effort normal and breath sounds normal. He has no wheezes.  Abdominal: Soft. There is no tenderness.  Musculoskeletal: Normal range of motion. He exhibits tenderness.  Full ROM of the DIP and PIP of the second or third digit.  Neurological: He is alert.  Distal sensation of the 2nd and 3rd digit intact.   Skin: Skin is warm and dry. No rash noted.  Pt has a 2 cm linear laceration  on the finger pad of the 3rd digit. 2.5 cm linear laceration of the finger pad of the 2nd digit, no involvement of the finger nail. Both lacerations not gaping.   Psychiatric: He has a normal mood and affect. His behavior is normal.    ED Course  Procedures (including critical care time) DIAGNOSTIC STUDIES: Oxygen Saturation is 98% on room air, normal by my interpretation.    COORDINATION OF CARE:  4:18 PM Discussed with pt of cleaning lacerations and advised to get prescriptions filled for sx's. 4:20 PM Cleaned pt's lacerations.    Labs Reviewed - No data to display No results found.   No diagnosis found.    MDM  Patient  presenting with lacerations to his 2nd and 3rd digit.  Lacerations occurred approximately 26 hours prior to arrival in the ED.  Therefore, patient beyond the time frame for suture repair.  Bleeding controlled at this time.  Lacerations irrigated well.  No signs of infection.  Patient recently prescribed Doxycycline after he had a tic bite.  Therefore, no additional antibiotic prescribed.  No apparent tendon damage.  Neurovascularly intact.  Patient stable for discharge.    I personally performed the services described in this documentation, which was scribed in my presence. The recorded information has been reviewed and is accurate.    Pascal Lux [place], PA-C 02/23/13 1933

## 2013-02-23 NOTE — ED Provider Notes (Signed)
  Medical screening examination/treatment/procedure(s) were performed by non-physician practitioner and as supervising physician I was immediately available for consultation/collaboration.    Shelena Castelluccio, MD 02/23/13 2354 

## 2013-10-23 ENCOUNTER — Encounter (HOSPITAL_COMMUNITY): Payer: Self-pay | Admitting: Emergency Medicine

## 2013-10-23 ENCOUNTER — Emergency Department (INDEPENDENT_AMBULATORY_CARE_PROVIDER_SITE_OTHER): Admission: EM | Admit: 2013-10-23 | Payer: Self-pay | Source: Home / Self Care

## 2013-10-23 DIAGNOSIS — Z711 Person with feared health complaint in whom no diagnosis is made: Secondary | ICD-10-CM

## 2013-10-23 NOTE — Discharge Instructions (Signed)
Call health dept for appt for std screening.

## 2013-10-23 NOTE — ED Provider Notes (Signed)
CSN: [id number]     Arrival date & time 10/23/13  1300 History   First MD Initiated Contact with Patient 10/23/13 1430     Chief Complaint  Patient presents with  . Exposure to STD   (Consider location/radiation/quality/duration/timing/severity/associated sxs/prior Treatment) Patient is a 23 y.o. male presenting with STD exposure. The history is provided by the patient.  Exposure to STD This is a new problem. Episode onset: requesting complete std check, no sx, advised health dept option.Marland Kitchen.    History reviewed. No pertinent past medical history. History reviewed. No pertinent past surgical history. History reviewed. No pertinent family history. History  Substance Use Topics  . Smoking status: Current Every Day Smoker    Types: Cigarettes  . Smokeless tobacco: Not on file  . Alcohol Use: No    Review of Systems  Allergies  Review of patient's allergies indicates no known allergies.  Home Medications   Current Outpatient Rx  Name  Route  Sig  Dispense  Refill  . doxycycline (VIBRAMYCIN) 100 MG capsule   Oral   Take 1 capsule (100 mg total) by mouth 2 (two) times daily.   20 capsule   0   . ibuprofen (ADVIL,MOTRIN) 800 MG tablet   Oral   Take 1 tablet (800 mg total) by mouth 3 (three) times daily.   21 tablet   0   . promethazine (PHENERGAN) 25 MG tablet   Oral   Take 1 tablet (25 mg total) by mouth every 6 (six) hours as needed for nausea.   12 tablet   0   . traMADol (ULTRAM) 50 MG tablet   Oral   Take 1 tablet (50 mg total) by mouth every 6 (six) hours as needed for pain.   15 tablet   0    BP 125/61  Pulse 75  Temp(Src) 98 F (36.7 C) (Oral)  Resp 16  SpO2 100% Physical Exam  ED Course  Procedures (including critical care time) Labs Review Labs Reviewed - No data to display Imaging Review No results found.  EKG Interpretation    Date/Time:    Ventricular Rate:    PR Interval:    QRS Duration:   QT Interval:    QTC Calculation:   R  Axis:     Text Interpretation:              MDM      Linna HoffJames D Kindl, MD 10/23/13 1451

## 2013-10-23 NOTE — ED Notes (Signed)
Asking for STD testing; denies syx

## 2013-11-22 ENCOUNTER — Emergency Department (HOSPITAL_COMMUNITY)
Admission: EM | Admit: 2013-11-22 | Discharge: 2013-11-22 | Disposition: A | Discharge status: Home / Self Care | Payer: Self-pay | Attending: Emergency Medicine | Admitting: Emergency Medicine

## 2013-11-22 ENCOUNTER — Encounter (HOSPITAL_COMMUNITY): Payer: Self-pay | Admitting: Emergency Medicine

## 2013-11-22 DIAGNOSIS — K625 Hemorrhage of anus and rectum: Secondary | ICD-10-CM | POA: Insufficient documentation

## 2013-11-22 DIAGNOSIS — Z87891 Personal history of nicotine dependence: Secondary | ICD-10-CM | POA: Insufficient documentation

## 2013-11-22 NOTE — ED Notes (Signed)
Pt states that since last year he has had intermittent blood on the tp when he wipes.  Yesterday he had some pain when he had a bm and noticed that there was blood in the toilet. Denies abdominal pain, constipation or changes in bowel or bladder habits.

## 2013-11-22 NOTE — Discharge Instructions (Signed)

## 2013-11-22 NOTE — ED Provider Notes (Signed)
CSN: [id number]     Arrival date & time 11/22/13  1537 History   First MD Initiated Contact with Patient 11/22/13 1546     Chief Complaint  Patient presents with  . Rectal Bleeding   (Consider location/radiation/quality/duration/timing/severity/associated sxs/prior Treatment) HPI  23 year old male presents for evaluation of rectal bleeding. Patient states for the past 2-3 years he has had several episodes of rectal bleeding when he noticed on toilet papers. States he has about 12 separate occasions of minimal bleeding, only on toilet paper. Last night however he noticed bright red blood in the toilet bowl after having a bowel movement. States he did strain a little bit denies any significant rectal pain, severe constipation, melena, or abdominal pain. Denies any lightheadedness dizziness chest pain or shortness of breath. Denies any rectal itching. He does not take any blood thinner medication, denies any change in bowel habits. No recent trauma. No penile pain or scrotal swelling. Denies any abnormal weight changes, night sweats, fevers, myalgias or any other B. symptoms.  History reviewed. No pertinent past medical history. History reviewed. No pertinent past surgical history. No family history on file. History  Substance Use Topics  . Smoking status: Former Smoker    Types: Cigarettes  . Smokeless tobacco: Not on file  . Alcohol Use: No    Review of Systems  All other systems reviewed and are negative.    Allergies  Review of patient's allergies indicates no known allergies.  Home Medications  No current outpatient prescriptions on file. BP 143/80  Pulse 63  Temp(Src) 97.4 F (36.3 C) (Oral)  Resp 16  Wt 154 lb 7 oz (70.052 kg)  SpO2 99% Physical Exam  Constitutional: He appears well-developed and well-nourished. No distress.  HENT:  Head: Atraumatic.  Eyes: Conjunctivae are normal.  Neck: Normal range of motion. Neck supple.  Cardiovascular: Normal rate and regular  rhythm.   Pulmonary/Chest: Effort normal and breath sounds normal.  Abdominal: Soft. There is no tenderness. There is no rebound and no guarding.  Genitourinary: Rectum normal and prostate normal. No penile tenderness.  Normal rectal tone, no external hemorrhoids, no anal fissure, no rectal mass, normal stool color.  Neurological: He is alert.  Skin: No rash noted.  Psychiatric: He has a normal mood and affect.    ED Course  Procedures (including critical care time)  4:54 PM Pt here with c/o rectal bleeding last night.  Unable to quantify amount but sts it filled up the toilet bowl.  No active bleeding currently, no abd pain. No NSAIDs use concerning for PUD.  Plan to refer pt to GI for further care.  Pt agrees.  Care discussed with Dr. Jodi MourningZavitz   Labs Review Labs Reviewed - No data to display Imaging Review No results found.  EKG Interpretation   None       MDM   1. Bright red rectal bleeding    BP 143/80  Pulse 63  Temp(Src) 97.4 F (36.3 C) (Oral)  Resp 16  Wt 154 lb 7 oz (70.052 kg)  SpO2 99%     Fayrene HelperBowie Kejon Feild, PA-C 11/22/13 1703

## 2013-11-25 LAB — OCCULT BLOOD, POC DEVICE: FECAL OCCULT BLD: NEGATIVE

## 2013-11-26 NOTE — ED Provider Notes (Signed)
Medical screening examination/treatment/procedure(s) were conducted as a shared visit with non-physician practitioner(s) or resident and myself. I personally evaluated the patient during the encounter and agree with the findings and plan unless otherwise indicated.  I have personally reviewed any xrays and/ or EKG's with the provider and I agree with interpretation.  One episode of mild blood in stools. No hx of GI bleeding, no known ulcers/ polyps/ hemorrhoids. Normal vitals. Abdo soft/ NT, mmm. No blood thinners. No nsaids or etoh. Low risk pt. Close outpt fup discussed.  Rectal bleeding     Enid SkeensJoshua M Roshaun Pound, MD 11/26/13 [phone]

## 2014-05-14 ENCOUNTER — Emergency Department (HOSPITAL_COMMUNITY): Payer: Self-pay

## 2014-05-14 ENCOUNTER — Inpatient Hospital Stay (HOSPITAL_COMMUNITY)
Admission: EM | Admit: 2014-05-14 | Discharge: 2014-05-16 | DRG: 917 | Disposition: A | Discharge status: Home / Self Care | Payer: Self-pay | Attending: Internal Medicine | Admitting: Internal Medicine

## 2014-05-14 ENCOUNTER — Encounter (HOSPITAL_COMMUNITY): Payer: Self-pay | Admitting: Emergency Medicine

## 2014-05-14 DIAGNOSIS — T401X4A Poisoning by heroin, undetermined, initial encounter: Principal | ICD-10-CM | POA: Diagnosis present

## 2014-05-14 DIAGNOSIS — R404 Transient alteration of awareness: Secondary | ICD-10-CM

## 2014-05-14 DIAGNOSIS — T50904A Poisoning by unspecified drugs, medicaments and biological substances, undetermined, initial encounter: Secondary | ICD-10-CM

## 2014-05-14 DIAGNOSIS — J690 Pneumonitis due to inhalation of food and vomit: Secondary | ICD-10-CM | POA: Diagnosis present

## 2014-05-14 DIAGNOSIS — F1994 Other psychoactive substance use, unspecified with psychoactive substance-induced mood disorder: Secondary | ICD-10-CM | POA: Diagnosis present

## 2014-05-14 DIAGNOSIS — E860 Dehydration: Secondary | ICD-10-CM | POA: Diagnosis present

## 2014-05-14 DIAGNOSIS — N390 Urinary tract infection, site not specified: Secondary | ICD-10-CM | POA: Diagnosis present

## 2014-05-14 DIAGNOSIS — N179 Acute kidney failure, unspecified: Secondary | ICD-10-CM | POA: Diagnosis present

## 2014-05-14 DIAGNOSIS — Z87891 Personal history of nicotine dependence: Secondary | ICD-10-CM

## 2014-05-14 DIAGNOSIS — E872 Acidosis, unspecified: Secondary | ICD-10-CM | POA: Diagnosis present

## 2014-05-14 DIAGNOSIS — R0902 Hypoxemia: Secondary | ICD-10-CM | POA: Diagnosis present

## 2014-05-14 DIAGNOSIS — R4189 Other symptoms and signs involving cognitive functions and awareness: Secondary | ICD-10-CM | POA: Diagnosis present

## 2014-05-14 DIAGNOSIS — T50901A Poisoning by unspecified drugs, medicaments and biological substances, accidental (unintentional), initial encounter: Secondary | ICD-10-CM

## 2014-05-14 DIAGNOSIS — T6591XS Toxic effect of unspecified substance, accidental (unintentional), sequela: Secondary | ICD-10-CM

## 2014-05-14 DIAGNOSIS — R230 Cyanosis: Secondary | ICD-10-CM | POA: Diagnosis present

## 2014-05-14 DIAGNOSIS — R82998 Other abnormal findings in urine: Secondary | ICD-10-CM | POA: Diagnosis present

## 2014-05-14 DIAGNOSIS — T50901S Poisoning by unspecified drugs, medicaments and biological substances, accidental (unintentional), sequela: Secondary | ICD-10-CM

## 2014-05-14 DIAGNOSIS — R7309 Other abnormal glucose: Secondary | ICD-10-CM | POA: Diagnosis present

## 2014-05-14 DIAGNOSIS — F1110 Opioid abuse, uncomplicated: Secondary | ICD-10-CM | POA: Diagnosis present

## 2014-05-14 HISTORY — DX: Other specified health status: Z78.9

## 2014-05-14 LAB — BLOOD GAS, VENOUS
ACID-BASE DEFICIT: 2.8 mmol/L — AB (ref 0.0–2.0)
BICARBONATE: 24.4 meq/L — AB (ref 20.0–24.0)
FIO2: 1 %
O2 Saturation: 72.6 %
PCO2 VEN: 52.5 mmHg — AB (ref 45.0–50.0)
PH VEN: 7.288 (ref 7.250–7.300)
PO2 VEN: 41.6 mmHg (ref 30.0–45.0)
Patient temperature: 98.6
TCO2: 21.5 mmol/L (ref 0–100)

## 2014-05-14 LAB — COMPREHENSIVE METABOLIC PANEL
ALBUMIN: 4.3 g/dL (ref 3.5–5.2)
ALK PHOS: 80 U/L (ref 39–117)
ALT: 26 U/L (ref 0–53)
ANION GAP: 22 — AB (ref 5–15)
AST: 39 U/L — ABNORMAL HIGH (ref 0–37)
BILIRUBIN TOTAL: 1.6 mg/dL — AB (ref 0.3–1.2)
BUN: 16 mg/dL (ref 6–23)
CHLORIDE: 100 meq/L (ref 96–112)
CO2: 20 meq/L (ref 19–32)
CREATININE: 1.53 mg/dL — AB (ref 0.50–1.35)
Calcium: 8.9 mg/dL (ref 8.4–10.5)
GFR calc Af Amer: 72 mL/min — ABNORMAL LOW (ref >90)
GFR, EST NON AFRICAN AMERICAN: 62 mL/min — AB (ref >90)
Glucose, Bld: 186 mg/dL — ABNORMAL HIGH (ref 70–99)
POTASSIUM: 4.4 meq/L (ref 3.7–5.3)
Sodium: 142 mEq/L (ref 137–147)
Total Protein: 7.4 g/dL (ref 6.0–8.3)

## 2014-05-14 LAB — CBC
HEMATOCRIT: 48.1 % (ref 39.0–52.0)
HEMOGLOBIN: 16.9 g/dL (ref 13.0–17.0)
MCH: 29.6 pg (ref 26.0–34.0)
MCHC: 35.1 g/dL (ref 30.0–36.0)
MCV: 84.2 fL (ref 78.0–100.0)
Platelets: 212 10*3/uL (ref 150–400)
RBC: 5.71 MIL/uL (ref 4.22–5.81)
RDW: 13.2 % (ref 11.5–15.5)
WBC: 12.7 10*3/uL — AB (ref 4.0–10.5)

## 2014-05-14 LAB — RAPID URINE DRUG SCREEN, HOSP PERFORMED
AMPHETAMINES: NOT DETECTED
BARBITURATES: NOT DETECTED
BENZODIAZEPINES: NOT DETECTED
Cocaine: NOT DETECTED
Opiates: POSITIVE — AB
Tetrahydrocannabinol: NOT DETECTED

## 2014-05-14 LAB — URINALYSIS, ROUTINE W REFLEX MICROSCOPIC
Bilirubin Urine: NEGATIVE
Glucose, UA: NEGATIVE mg/dL
KETONES UR: NEGATIVE mg/dL
LEUKOCYTES UA: NEGATIVE
NITRITE: NEGATIVE
PH: 5 (ref 5.0–8.0)
PROTEIN: 100 mg/dL — AB
Specific Gravity, Urine: 1.022 (ref 1.005–1.030)
Urobilinogen, UA: 0.2 mg/dL (ref 0.0–1.0)

## 2014-05-14 LAB — ETHANOL: Alcohol, Ethyl (B): <11 mg/dL (ref 0–11)

## 2014-05-14 LAB — URINE MICROSCOPIC-ADD ON

## 2014-05-14 LAB — SALICYLATE LEVEL: Salicylate Lvl: <2 mg/dL — ABNORMAL LOW (ref 2.8–20.0)

## 2014-05-14 LAB — ACETAMINOPHEN LEVEL

## 2014-05-14 MED ORDER — ONDANSETRON HCL 4 MG/2ML IJ SOLN
4.0000 mg | Freq: Four times a day (QID) | INTRAMUSCULAR | Status: DC | PRN
Start: 2014-05-14 — End: 2014-05-16
  Administered 2014-05-15: 4 mg via INTRAVENOUS
  Filled 2014-05-14: qty 2

## 2014-05-14 MED ORDER — ENOXAPARIN SODIUM 40 MG/0.4ML ~~LOC~~ SOLN
40.0000 mg | SUBCUTANEOUS | Status: DC
  Administered 2014-05-14 – 2014-05-15 (×2): 40 mg via SUBCUTANEOUS
  Filled 2014-05-14 (×2): qty 0.4

## 2014-05-14 MED ORDER — NALOXONE HCL 1 MG/ML IJ SOLN
1.0000 mg | Freq: Once | INTRAMUSCULAR | Status: AC
  Administered 2014-05-14: 1 mg via INTRAVENOUS

## 2014-05-14 MED ORDER — DEXTROSE 5 % IV SOLN
500.0000 mg | INTRAVENOUS | Status: DC
  Administered 2014-05-14: 500 mg via INTRAVENOUS
  Filled 2014-05-14: qty 500

## 2014-05-14 MED ORDER — NALOXONE HCL 1 MG/ML IJ SOLN
INTRAMUSCULAR | Status: AC
  Filled 2014-05-14: qty 2

## 2014-05-14 MED ORDER — DEXTROSE 5 % IV SOLN
1.0000 g | INTRAVENOUS | Status: DC
  Administered 2014-05-15: 1 g via INTRAVENOUS
  Filled 2014-05-14 (×2): qty 10

## 2014-05-14 MED ORDER — FAMOTIDINE IN NACL 20-0.9 MG/50ML-% IV SOLN
20.0000 mg | Freq: Two times a day (BID) | INTRAVENOUS | Status: AC
  Administered 2014-05-14 – 2014-05-15 (×3): 20 mg via INTRAVENOUS
  Filled 2014-05-14 (×3): qty 50

## 2014-05-14 MED ORDER — SODIUM CHLORIDE 0.9 % IJ SOLN
3.0000 mL | Freq: Two times a day (BID) | INTRAMUSCULAR | Status: DC
  Administered 2014-05-15 (×2): 3 mL via INTRAVENOUS

## 2014-05-14 MED ORDER — ONDANSETRON HCL 4 MG PO TABS: 4.0000 mg | ORAL_TABLET | Freq: Four times a day (QID) | ORAL | Status: DC | PRN

## 2014-05-14 MED ORDER — SODIUM CHLORIDE 0.9 % IV SOLN
INTRAVENOUS | Status: DC
  Administered 2014-05-14: 1000 mL via INTRAVENOUS

## 2014-05-14 MED ORDER — PIPERACILLIN SOD-TAZOBACTAM SO 2.25 (2-0.25) G IV SOLR
3.3750 g | Freq: Once | INTRAVENOUS | Status: AC
  Administered 2014-05-14: 3.375 g via INTRAVENOUS
  Filled 2014-05-14: qty 3.38

## 2014-05-14 MED ORDER — NALOXONE HCL 1 MG/ML IJ SOLN
INTRAMUSCULAR | Status: AC
  Administered 2014-05-14: 1 mg via INTRAVENOUS
  Filled 2014-05-14: qty 2

## 2014-05-14 MED ORDER — NALOXONE HCL 0.4 MG/ML IJ SOLN: 0.4000 mg | INTRAMUSCULAR | Status: DC | PRN

## 2014-05-14 NOTE — ED Notes (Signed)
MD at bedside. 

## 2014-05-14 NOTE — ED Provider Notes (Signed)
CSN: [id number]     Arrival date & time 05/14/14  1853 History   First MD Initiated Contact with Patient 05/14/14 1854     Chief Complaint  Patient presents with  . Ingestion     (Consider location/radiation/quality/duration/timing/severity/associated sxs/prior Treatment) HPI Comments: 23 year old male presents after being found apneic and purple in the yard near the homeless shelter, the patient was given intranasal Narcan and an IV was placed, he had improvement after the Narcan however the patient had remained hypoxic and required a non-rebreather to maintain an oxygen saturation of 80%. The symptoms are persistent, severe and the patient was difficult to arouse and to get a history because of his altered mental status and hypoxia that persisted despite waking up. There was no witnessed aspiration however the patient did have complete apnea when the paramedics arrived. He had good pulses at that time  Patient is a 23 y.o. male presenting with Ingested Medication. The history is provided by the EMS personnel.  Ingestion    Past Medical History  Diagnosis Date  . Medical history non-contributory    Past Surgical History  Procedure Laterality Date  . Wisdom tooth extraction     History reviewed. No pertinent family history. History  Substance Use Topics  . Smoking status: Former Smoker    Types: Cigarettes    Quit date: 05/14/2008  . Smokeless tobacco: Never Used  . Alcohol Use: Yes     Comment: rarely    Review of Systems  Unable to perform ROS: Mental status change      Allergies  Review of patient's allergies indicates no known allergies.  Home Medications   Prior to Admission medications   Not on File   BP 122/66  Pulse 107  Resp 25  SpO2 100% Physical Exam  Nursing note and vitals reviewed. Constitutional: He appears well-developed and well-nourished. No distress.  HENT:  Head: Normocephalic and atraumatic.  Mouth/Throat: Oropharynx is clear and moist.  No oropharyngeal exudate.  Eyes: Conjunctivae and EOM are normal. Pupils are equal, round, and reactive to light. Right eye exhibits no discharge. Left eye exhibits no discharge. No scleral icterus.  Neck: Normal range of motion. Neck supple. No JVD present. No thyromegaly present.  Cardiovascular: Normal rate, regular rhythm, normal heart sounds and intact distal pulses.  Exam reveals no gallop and no friction rub.   No murmur heard. Pulmonary/Chest: He has no wheezes. He has no rales.  Increased work of breathing, shallow breathing, no wheezes or rales  Abdominal: Soft. Bowel sounds are normal. He exhibits no distension and no mass. There is no tenderness.  Musculoskeletal: Normal range of motion. He exhibits no edema and no tenderness.  Lymphadenopathy:    He has no cervical adenopathy.  Neurological: He is alert. Coordination normal.  Moves all 4 extremities, is able to speak but becomes confused quite easily  Skin: Skin is warm and dry. No rash noted. No erythema.  Psychiatric: He has a normal mood and affect. His behavior is normal.    ED Course  Procedures (including critical care time) Labs Review Labs Reviewed  CBC - Abnormal; Notable for the following:    WBC 12.7 (*)    All other components within normal limits  COMPREHENSIVE METABOLIC PANEL - Abnormal; Notable for the following:    Glucose, Bld 186 (*)    Creatinine, Ser 1.53 (*)    AST 39 (*)    Total Bilirubin 1.6 (*)    GFR calc non Af Amer 62 (*)  GFR calc Af Amer 72 (*)    Anion gap 22 (*)    All other components within normal limits  URINE RAPID DRUG SCREEN (HOSP PERFORMED) - Abnormal; Notable for the following:    Opiates POSITIVE (*)    All other components within normal limits  SALICYLATE LEVEL - Abnormal; Notable for the following:    Salicylate Lvl <2.0 (*)    All other components within normal limits  ETHANOL  ACETAMINOPHEN LEVEL  URINALYSIS, ROUTINE W REFLEX MICROSCOPIC  BLOOD GAS, VENOUS     Imaging Review Dg Chest Port 1 View  05/14/2014   CLINICAL DATA:  Overdose with shortness of breath. Question aspiration.  EXAM: PORTABLE CHEST - 1 VIEW  COMPARISON:  Radiographs 04/11/2012 and 06/06/2010.  FINDINGS: 1919 hr. There is new suprahilar airspace disease on the left with mild volume loss. No other focal airspace opacities are identified. There is no pleural effusion or pneumothorax. The heart size and mediastinal contours are otherwise stable.  IMPRESSION: New left upper lobe suprahilar infiltrate worrisome for aspiration and possible developing pneumonia.   Electronically Signed   By: Roxy Horseman M.D.   On: 05/14/2014 19:31     EKG Interpretation   Date/Time:  Wednesday May 14 2014 18:56:22 EDT Ventricular Rate:  106 PR Interval:  173 QRS Duration: 86 QT Interval:  325 QTC Calculation: 431 R Axis:   76 Text Interpretation:  Sinus tachycardia Probable left atrial enlargement  No old tracing to compare Confirmed by Prout  MD, Chananya Canizalez ([id number]) on  05/14/2014 7:06:33 PM      MDM   Final diagnoses:  Aspiration pneumonia, unspecified aspiration pneumonia type  Overdose, undetermined intent, initial encounter    The patient has a slight cyanosis around his lips and a persistent hypoxia. He is tachycardic, I suspect that he had an aspiration event with a prolonged respiratory arrest and at this time the patient will need oxygen supplementation during his workup and admission to the hospital for likely aspiration, ABG pending, may need further airway management if he continues to be hypoxic or altered.  Laboratory workup shows the patient is positive for opiates, has a leukocytosis, renal insufficiencybut no signs of acetaminophen or salicylate toxicity. His x-ray is consistent with a aspiration pneumonia which fits the clinical picture as the patient has had persistent coughing of bloody purulent sputum. He has had persistent decreased mental status which increases for 20 or  30 minutes after Narcan administration, his hypoxia has temporarily resolves when he received Narcan and high flow oxygen.  The patient required multiple re\re evaluations and close monitoring including cardiac monitoring, continuous pulse ox monitoring and discussion with the hospitalist service will be to admit the patient to the step down unit for close monitoring. The patient is critically ill, critical care provided.  CRITICAL CARE Performed by: Vida Roller Total critical care time: 35 Critical care time was exclusive of separately billable procedures and treating other patients. Critical care was necessary to treat or prevent imminent or life-threatening deterioration. Critical care was time spent personally by me on the following activities: development of treatment plan with patient and/or surrogate as well as nursing, discussions with consultants, evaluation of patient's response to treatment, examination of patient, obtaining history from patient or surrogate, ordering and performing treatments and interventions, ordering and review of laboratory studies, ordering and review of radiographic studies, pulse oximetry and re-evaluation of patient's condition.   Vida Roller, MD 05/14/14 2108

## 2014-05-14 NOTE — ED Notes (Signed)
Pt is made aware that urine is needed for sample, pt sts that he cannot urinate at this time due to he feels weak and does not want an In and Out cath, RN is notified and writer informed pt that we can give 30 minutes to try to urinate on his own, after 30 mins an In and Out cath will be done.

## 2014-05-14 NOTE — ED Notes (Signed)
Bed: RESA Expected date:  Expected time:  Means of arrival:  Comments: EMS- unresponsive, respiratory arrest

## 2014-05-14 NOTE — ED Notes (Signed)
Pt was incontinent of urine on arrival

## 2014-05-14 NOTE — ED Notes (Signed)
Patient placed on [place] 6L per admitting Dr. Request. Patient 02 sats 99% on 6L.

## 2014-05-14 NOTE — Progress Notes (Signed)
ANTIBIOTIC CONSULT NOTE - INITIAL  Pharmacy Consult for ceftriaxone  Indication: pneumonia  No Known Allergies  Patient Measurements:   Adjusted Body Weight:   Vital Signs: BP: 122/66 mmHg (07/29 2000) Pulse Rate: 107 (07/29 2000) Intake/Output from previous day:   Intake/Output from this shift:    Labs:  Recent Labs  05/14/14 1902  WBC 12.7*  HGB 16.9  PLT 212  CREATININE 1.53*   The CrCl is unknown because both a height and weight (above a minimum accepted value) are required for this calculation. No results found for this basename: VANCOTROUGH, VANCOPEAK, VANCORANDOM, GENTTROUGH, GENTPEAK, GENTRANDOM, TOBRATROUGH, TOBRAPEAK, TOBRARND, AMIKACINPEAK, AMIKACINTROU, AMIKACIN,  in the last 72 hours   Microbiology: No results found for this or any previous visit (from the past 720 hour(s)).  Medical History: Past Medical History  Diagnosis Date  . Medical history non-contributory     Assessment: [age] YOM brought via EMS after being found apeic and purple in yard near homeless shelter. CXR c/w aspiration pneumonia. EDP suspects prolonged respiratory arrest but currently not intubated. Zosyn x 1 to ED, orders to start ceftriaxone/aztihromycin  7/29 >> ceftriaxone  >> 7/29 >>azithromycin (MD)  >>    Tmax: WBCs: slightly elevated Renal: SCr slightly elevated  No cultures  Goal of Therapy:  Dose for indication  Plan:   Ceftriaxone 1gm IV q24h for pneumonia  No further dose adjust needed, pharmacy to sign off from note writing  Juliette Alcideustin Denasia Venn, PharmD, BCPS.   Pager: [phone]  05/14/2014,9:55 PM

## 2014-05-14 NOTE — ED Notes (Signed)
Per EMS pt was was found on side of road with his friend purple, in respiratory distress, pin point pupils and Glascow scale of 3. Pt was given Narcan 2mg  nasal in route per EMS. Pt now admitting to using IV heroin today. Pt has 18g in Left AC.

## 2014-05-14 NOTE — ED Notes (Addendum)
Pt has in belonging bag:  [employer] MaterialsBrown cargo shorts, blue belt, blue basketball shorts, white Art gallery managercharger and ear phones, money pocket holder -3 (2 dollar bills), one twenty dollar bill) , black key rings, condom, gray silver pocket knife, brown wallet ([employer] debit card, [place] ID card) 2 black cell phone, red cell phone holder at the nurses station by Res A

## 2014-05-14 NOTE — ED Notes (Signed)
RT at bedside to obtain ABG.

## 2014-05-14 NOTE — H&P (Signed)
[facility] History and Physical  Patient: Gerald Oliver  ZOX:[id number]  DOB: Jan 03, 1991  DOS: the patient was seen and examined on 05/14/2014 PCP: Default, Provider, MD  Chief Complaint: unresponsiveness  HPI: Gerald Oliver is a 23 y.o. male with no significant Past medical history The patient presented continuance as he was unresponsive. He was found near homeless shelter apneic and cyanotic by the EMS. He had pinpoint pupils and therefore the EMS gave him Narcan with that she was awake. He was significantly hypoxic and therefore was placed on nonrebreather initially was having 80% saturation on 100% nonrebreather. There was gradual improvement in his mental status with her present dose of Narcan and the patient was recommended for admission. At the time of my evaluation the patient was awake and alert denies abusing any other drugs. Denies any prior medical history of coronary prescription medication use. Denies any alcohol abuse. He also denies any suicidal or homicidal ideation. He complaints of nausea, some headache, dizziness without vertigo, fatigue.  The patient is coming from home. And at his baseline independent for most of his ADL.  Review of Systems: as mentioned in the history of present illness.  A Comprehensive review of the other systems is negative.  Past Medical History  Diagnosis Date  . Medical history non-contributory    Past Surgical History  Procedure Laterality Date  . Wisdom tooth extraction     Social History:  reports that he quit smoking about 6 years ago. His smoking use included Cigarettes. He smoked 0.00 packs per day. He has never used smokeless tobacco. He reports that he drinks alcohol. He reports that he uses illicit drugs.  No Known Allergies  History reviewed. No pertinent family history.  Prior to Admission medications   Not on File    Physical Exam: Filed Vitals:   05/14/14 1915 05/14/14 1930 05/14/14 1945 05/14/14 2000   BP: 135/69 125/65 119/63 122/66  Pulse: 108 110 102 107  Resp: 37 22 35 25  SpO2: 96% 100% 100% 100%    General: Alert, Awake and Oriented to Time, Place and Person. Appear in mild distress Eyes: PERRL ENT: Oral Mucosa clear dry. Neck: no JVD Cardiovascular: S1 and S2 Present, no Murmur, Peripheral Pulses Present Respiratory: Bilateral Air entry equal and Decreased, Clear to Auscultation, noCrackles, no wheezes Abdomen: Bowel Sound Present, Soft and Non tender Skin: no Rash Extremities: no Pedal edema, no calf tenderness Neurologic: Grossly no focal neuro deficit.  Labs on Admission:  CBC:  Recent Labs Lab 05/14/14 1902  WBC 12.7*  HGB 16.9  HCT 48.1  MCV 84.2  PLT 212    CMP     Component Value Date/Time   NA 142 05/14/2014 1902   K 4.4 05/14/2014 1902   CL 100 05/14/2014 1902   CO2 20 05/14/2014 1902   GLUCOSE 186* 05/14/2014 1902   BUN 16 05/14/2014 1902   CREATININE 1.53* 05/14/2014 1902   CALCIUM 8.9 05/14/2014 1902   PROT 7.4 05/14/2014 1902   ALBUMIN 4.3 05/14/2014 1902   AST 39* 05/14/2014 1902   ALT 26 05/14/2014 1902   ALKPHOS 80 05/14/2014 1902   BILITOT 1.6* 05/14/2014 1902   GFRNONAA 62* 05/14/2014 1902   GFRAA 72* 05/14/2014 1902    No results found for this basename: LIPASE, AMYLASE,  in the last 168 hours No results found for this basename: AMMONIA,  in the last 168 hours  No results found for this basename: CKTOTAL, CKMB, CKMBINDEX, TROPONINI,  in the  last 168 hours BNP (last 3 results) No results found for this basename: PROBNP,  in the last 8760 hours  Radiological Exams on Admission: Dg Chest Port 1 View  05/14/2014   CLINICAL DATA:  Overdose with shortness of breath. Question aspiration.  EXAM: PORTABLE CHEST - 1 VIEW  COMPARISON:  Radiographs 04/11/2012 and 06/06/2010.  FINDINGS: 1919 hr. There is new suprahilar airspace disease on the left with mild volume loss. No other focal airspace opacities are identified. There is no pleural effusion or  pneumothorax. The heart size and mediastinal contours are otherwise stable.  IMPRESSION: New left upper lobe suprahilar infiltrate worrisome for aspiration and possible developing pneumonia.   Electronically Signed   By: Roxy Horseman M.D.   On: 05/14/2014 19:31    EKG: Independently reviewed. normal sinus rhythm, sinus tachycardia. Assessment/Plan Principal Problem:   Drug overdose Active Problems:   Heroin abuse   Aspiration pneumonia   AKI (acute kidney injury)   Unresponsive   1. Drug overdose Patient presenting with unresponsiveness with cyanosis and hypoxia. His found to have respiratory acidosis with elevated PCO2. At the time of my evaluation he is significantly much better than his early presentation. He denies any chronic use of opioids and mentions this was his first attempt. His chest x-ray is positive for possible aspiration pneumonia. With this the patient will be admitted in the hospital. Monitoring step down unit. BiPAP as needed. Wean his oxygen from nonrebreather. IV hydration. Aspiration precaution. Monitor for withdrawal.  2. Mild UTI. Likely due to dehydration. Next I would continue IV hydration.  3. Possible aspiration pneumonia. At present continue with ceftriaxone and azithromycin.  DVT Prophylaxis: subcutaneous Heparin Nutrition:  Regular diet  Code Status: full  Disposition: Admitted to inpatient in step-down unit.  Author: Lynden Oxford, MD [facility] Pager: [phone] 05/14/2014, 10:01 PM    If 7PM-7AM, please contact night-coverage [email or web address] Password [email or web address]  **Disclaimer: This note may have been dictated with voice recognition software. Similar sounding words can inadvertently be transcribed and this note may contain transcription errors which may not have been corrected upon publication of note.**

## 2014-05-15 LAB — CBC
HCT: 42.7 % (ref 39.0–52.0)
Hemoglobin: 15.1 g/dL (ref 13.0–17.0)
MCH: 29.5 pg (ref 26.0–34.0)
MCHC: 35.4 g/dL (ref 30.0–36.0)
MCV: 83.4 fL (ref 78.0–100.0)
PLATELETS: 170 10*3/uL (ref 150–400)
RBC: 5.12 MIL/uL (ref 4.22–5.81)
RDW: 12.9 % (ref 11.5–15.5)
WBC: 17.1 10*3/uL — AB (ref 4.0–10.5)

## 2014-05-15 LAB — COMPREHENSIVE METABOLIC PANEL
ALT: 26 U/L (ref 0–53)
AST: 24 U/L (ref 0–37)
Albumin: 4 g/dL (ref 3.5–5.2)
Alkaline Phosphatase: 61 U/L (ref 39–117)
Anion gap: 12 (ref 5–15)
BUN: 18 mg/dL (ref 6–23)
CALCIUM: 9 mg/dL (ref 8.4–10.5)
CO2: 25 meq/L (ref 19–32)
Chloride: 102 mEq/L (ref 96–112)
Creatinine, Ser: 1.25 mg/dL (ref 0.50–1.35)
GFR, EST NON AFRICAN AMERICAN: 79 mL/min — AB (ref >90)
Glucose, Bld: 126 mg/dL — ABNORMAL HIGH (ref 70–99)
Potassium: 4.7 mEq/L (ref 3.7–5.3)
SODIUM: 139 meq/L (ref 137–147)
Total Bilirubin: 1.8 mg/dL — ABNORMAL HIGH (ref 0.3–1.2)
Total Protein: 6.8 g/dL (ref 6.0–8.3)

## 2014-05-15 LAB — HIV ANTIBODY (ROUTINE TESTING W REFLEX): HIV 1&2 Ab, 4th Generation: NONREACTIVE

## 2014-05-15 LAB — PROTIME-INR
INR: 1.15 (ref 0.00–1.49)
PROTHROMBIN TIME: 14.7 s (ref 11.6–15.2)

## 2014-05-15 LAB — MRSA PCR SCREENING: MRSA by PCR: INVALID — AB

## 2014-05-15 MED ORDER — ACETAMINOPHEN 325 MG PO TABS
650.0000 mg | ORAL_TABLET | Freq: Four times a day (QID) | ORAL | Status: DC | PRN
  Administered 2014-05-15 – 2014-05-16 (×2): 650 mg via ORAL
  Filled 2014-05-15 (×2): qty 2

## 2014-05-15 MED ORDER — PIPERACILLIN-TAZOBACTAM 3.375 G IVPB
3.3750 g | Freq: Three times a day (TID) | INTRAVENOUS | Status: DC
  Administered 2014-05-15 – 2014-05-16 (×4): 3.375 g via INTRAVENOUS
  Filled 2014-05-15 (×4): qty 50

## 2014-05-15 NOTE — Progress Notes (Signed)
CARE MANAGEMENT NOTE 05/15/2014  Patient:  Gerald Oliver,Gerald Oliver   Account Number:  [id number]  Date Initiated:  05/15/2014  Documentation initiated by:  DAVIS,RHONDA  Subjective/Objective Assessment:   found near homeless shelter apneic and cyanotic by the EMS.  pinpoint pupils and therefore the EMS gave him Narcan with that he was awake. He was significantly hypoxic and therefore was placed on nonrebreather initially was having 8     Action/Plan:   home when stable   Anticipated DC Date:  05/18/2014   Anticipated DC Plan:  HOME/SELF CARE  In-house referral  Clinical Social Worker  Artistinancial Counselor      DC Planning Services  CM consult      [facility]   Choice offered to / List presented to:  NA   DME arranged  NA      DME agency  NA     HH arranged  NA      HH agency  NA   Status of service:  In process, will continue to follow Medicare Important Message given?  NA - LOS <3 / Initial given by admissions (If response is "NO", the following Medicare IM given date fields will be blank) Date Medicare IM given:   Medicare IM given by:   Date Additional Medicare IM given:   Additional Medicare IM given by:    Discharge Disposition:    Per UR Regulation:  Reviewed for med. necessity/level of care/duration of stay  If discussed at Long Length of Stay Meetings, dates discussed:    Comments:  [id number]/Rhonda Stark JockDavis, RN, BSN, [place] [phone] Chart Reviewed for discharge and hospital needs. Discharge needs at time of review: None present will follow for needs. Review of patient progress due on [id number]

## 2014-05-15 NOTE — Consult Note (Signed)
[employer] Psychiatry Consult   Reason for Consult:  Heroin overdose Referring Physician: Sheila Oats, MD   Gerald Oliver is an 23 y.o. male. Total Time spent with patient: 45 minutes  Assessment: AXIS I:  Substance Abuse and Substance Induced Mood Disorder AXIS II:  Deferred AXIS III:   Past Medical History  Diagnosis Date  . Medical history non-contributory    AXIS IV:  other psychosocial or environmental problems, problems related to social environment and problems with primary support group AXIS V:  51-60 moderate symptoms  Plan: Case discussed with Sheila Oats, MD No evidence of imminent risk to self or others at present.   Patient does not meet criteria for psychiatric inpatient admission. Supportive therapy provided about ongoing stressors. Discussed crisis plan, support from social network, calling 911, coming to the Emergency Department, and calling Suicide Hotline. Appreciate psychiatric consultation and will sign off at this time Please contact [phone] if needs further assistance  Subjective:   Gerald Oliver is a 22 y.o. male patient admitted with heroin overdose.  HPI:  Gerald Oliver is a 23 y.o. Male seen for psychiatric consultation for substance abuse and possible treatment needs. Patient has successfully completed [facility] - substance abuse rehabilitation treatment and than relapsed yesterday by drinking some alcohol and IV heroin about 20 grams along with another friend. He feels regrets for his slip and says he is gratful for bing alive and feels he is in control and wants to stay away from drugs and look for temp job. He has mother, her  BF and brother but not supportive to him. Reportedly family history fo alcohol and substance abuse is present. Patient does not consent to contact his probation officer. He was charged for assault and has 30 days jail time which will be activated on 05/19/14. Patient denied current symptoms of depression, anxiety  and psychosis. He has denied current suicidal or homicidal ideation, intention or plans. He has no evidence of psychosis. He has contract for safety and willing to follow out patient program for substance abuse. He complaints of nausea, headache, dizziness without vertigo, fatigue but no vomitings, UDS is positive for opioids.   Review of Systems: as mentioned in the history of present illness.  A Comprehensive review of the other systems is negative.  HPI Elements:   Location:  substance abuse. Quality:  poor. Severity:  acute. Timing:  unknown.  Past Psychiatric History: Past Medical History  Diagnosis Date  . Medical history non-contributory     reports that he quit smoking about 6 years ago. His smoking use included Cigarettes. He smoked 0.00 packs per day. He has never used smokeless tobacco. He reports that he drinks alcohol. He reports that he uses illicit drugs. History reviewed. No pertinent family history.   Living Arrangements: Parent (mother)   Abuse/Neglect [id number]) Physical Abuse: Denies Verbal Abuse: Denies Sexual Abuse: Denies Allergies:  No Known Allergies  ACT Assessment Complete:  NO Objective: Blood pressure 116/53, pulse 87, temperature 99 F (37.2 C), temperature source Oral, resp. rate 27, height '5\' 9"'  (1.753 m), weight 69.2 kg (152 lb 8.9 oz), SpO2 95.00%.Body mass index is 22.52 kg/(m^2). Results for orders placed during the hospital encounter of 05/14/14 (from the past 72 hour(s))  CBC     Status: Abnormal   Collection Time    05/14/14  7:02 PM      Result Value Ref Range   WBC 12.7 (*) 4.0 - 10.5 K/uL   RBC  5.71  4.22 - 5.81 MIL/uL   Hemoglobin 16.9  13.0 - 17.0 g/dL   HCT 48.1  39.0 - 52.0 %   MCV 84.2  78.0 - 100.0 fL   MCH 29.6  26.0 - 34.0 pg   MCHC 35.1  30.0 - 36.0 g/dL   RDW 13.2  11.5 - 15.5 %   Platelets 212  150 - 400 K/uL  COMPREHENSIVE METABOLIC PANEL     Status: Abnormal   Collection Time    05/14/14  7:02 PM      Result Value Ref  Range   Sodium 142  137 - 147 mEq/L   Potassium 4.4  3.7 - 5.3 mEq/L   Chloride 100  96 - 112 mEq/L   CO2 20  19 - 32 mEq/L   Glucose, Bld 186 (*) 70 - 99 mg/dL   BUN 16  6 - 23 mg/dL   Creatinine, Ser 1.53 (*) 0.50 - 1.35 mg/dL   Calcium 8.9  8.4 - 10.5 mg/dL   Total Protein 7.4  6.0 - 8.3 g/dL   Albumin 4.3  3.5 - 5.2 g/dL   AST 39 (*) 0 - 37 U/L   ALT 26  0 - 53 U/L   Alkaline Phosphatase 80  39 - 117 U/L   Total Bilirubin 1.6 (*) 0.3 - 1.2 mg/dL   GFR calc non Af Amer 62 (*) >90 mL/min   GFR calc Af Amer 72 (*) >90 mL/min   Comment: (NOTE)     The eGFR has been calculated using the CKD EPI equation.     This calculation has not been validated in all clinical situations.     eGFR's persistently <90 mL/min signify possible Chronic Kidney     Disease.   Anion gap 22 (*) 5 - 15  ETHANOL     Status: None   Collection Time    05/14/14  7:02 PM      Result Value Ref Range   Alcohol, Ethyl (B) <11  0 - 11 mg/dL   Comment:            LOWEST DETECTABLE LIMIT FOR     SERUM ALCOHOL IS 11 mg/dL     FOR MEDICAL PURPOSES ONLY  ACETAMINOPHEN LEVEL     Status: None   Collection Time    05/14/14  7:02 PM      Result Value Ref Range   Acetaminophen (Tylenol), Serum <15.0  10 - 30 ug/mL   Comment:            THERAPEUTIC CONCENTRATIONS VARY     SIGNIFICANTLY. A RANGE OF 10-30     ug/mL MAY BE AN EFFECTIVE     CONCENTRATION FOR MANY PATIENTS.     HOWEVER, SOME ARE BEST TREATED     AT CONCENTRATIONS OUTSIDE THIS     RANGE.     ACETAMINOPHEN CONCENTRATIONS     >150 ug/mL AT 4 HOURS AFTER     INGESTION AND >50 ug/mL AT 12     HOURS AFTER INGESTION ARE     OFTEN ASSOCIATED WITH TOXIC     REACTIONS.  SALICYLATE LEVEL     Status: Abnormal   Collection Time    05/14/14  7:02 PM      Result Value Ref Range   Salicylate Lvl <6.9 (*) 2.8 - 20.0 mg/dL  URINE RAPID DRUG SCREEN (HOSP PERFORMED)     Status: Abnormal   Collection Time    05/14/14  8:35 PM  Result Value Ref Range    Opiates POSITIVE (*) NONE DETECTED   Cocaine NONE DETECTED  NONE DETECTED   Benzodiazepines NONE DETECTED  NONE DETECTED   Amphetamines NONE DETECTED  NONE DETECTED   Tetrahydrocannabinol NONE DETECTED  NONE DETECTED   Barbiturates NONE DETECTED  NONE DETECTED   Comment:            DRUG SCREEN FOR MEDICAL PURPOSES     ONLY.  IF CONFIRMATION IS NEEDED     FOR ANY PURPOSE, NOTIFY LAB     WITHIN 5 DAYS.                LOWEST DETECTABLE LIMITS     FOR URINE DRUG SCREEN     Drug Class       Cutoff (ng/mL)     Amphetamine      1000     Barbiturate      200     Benzodiazepine   606     Tricyclics       301     Opiates          300     Cocaine          300     THC              50  URINALYSIS, ROUTINE W REFLEX MICROSCOPIC     Status: Abnormal   Collection Time    05/14/14  8:35 PM      Result Value Ref Range   Color, Urine YELLOW  YELLOW   APPearance CLOUDY (*) CLEAR   Specific Gravity, Urine 1.022  1.005 - 1.030   pH 5.0  5.0 - 8.0   Glucose, UA NEGATIVE  NEGATIVE mg/dL   Hgb urine dipstick TRACE (*) NEGATIVE   Bilirubin Urine NEGATIVE  NEGATIVE   Ketones, ur NEGATIVE  NEGATIVE mg/dL   Protein, ur 100 (*) NEGATIVE mg/dL   Urobilinogen, UA 0.2  0.0 - 1.0 mg/dL   Nitrite NEGATIVE  NEGATIVE   Leukocytes, UA NEGATIVE  NEGATIVE  URINE MICROSCOPIC-ADD ON     Status: Abnormal   Collection Time    05/14/14  8:35 PM      Result Value Ref Range   Squamous Epithelial / LPF RARE  RARE   WBC, UA 3-6  <3 WBC/hpf   RBC / HPF 0-2  <3 RBC/hpf   Bacteria, UA FEW (*) RARE   Urine-Other SPERM PRESENT    BLOOD GAS, VENOUS     Status: Abnormal   Collection Time    05/14/14  9:33 PM      Result Value Ref Range   FIO2 1.00     Delivery systems NON-REBREATHER OXYGEN MASK     pH, Ven 7.288  7.250 - 7.300   pCO2, Ven 52.5 (*) 45.0 - 50.0 mmHg   pO2, Ven 41.6  30.0 - 45.0 mmHg   Bicarbonate 24.4 (*) 20.0 - 24.0 mEq/L   TCO2 21.5  0 - 100 mmol/L   Acid-base deficit 2.8 (*) 0.0 - 2.0 mmol/L   O2  Saturation 72.6     Patient temperature 98.6     Collection site VEIN     Drawn by COLLECTED BY LABORATORY     Sample type VENOUS    MRSA PCR SCREENING     Status: Abnormal   Collection Time    05/14/14 11:09 PM      Result Value Ref Range   MRSA by PCR INVALID RESULTS, SPECIMEN  SENT FOR CULTURE (*) NEGATIVE   Comment: SPOKE WITH COBB,D RN [phone] COVINGTON,N                The GeneXpert MRSA Assay (FDA     approved for NASAL specimens     only), is one component of a     comprehensive MRSA colonization     surveillance program. It is not     intended to diagnose MRSA     infection nor to guide or     monitor treatment for     MRSA infections.  COMPREHENSIVE METABOLIC PANEL     Status: Abnormal   Collection Time    05/15/14  3:45 AM      Result Value Ref Range   Sodium 139  137 - 147 mEq/L   Potassium 4.7  3.7 - 5.3 mEq/L   Chloride 102  96 - 112 mEq/L   CO2 25  19 - 32 mEq/L   Glucose, Bld 126 (*) 70 - 99 mg/dL   BUN 18  6 - 23 mg/dL   Creatinine, Ser 1.25  0.50 - 1.35 mg/dL   Calcium 9.0  8.4 - 10.5 mg/dL   Total Protein 6.8  6.0 - 8.3 g/dL   Albumin 4.0  3.5 - 5.2 g/dL   AST 24  0 - 37 U/L   ALT 26  0 - 53 U/L   Alkaline Phosphatase 61  39 - 117 U/L   Total Bilirubin 1.8 (*) 0.3 - 1.2 mg/dL   GFR calc non Af Amer 79 (*) >90 mL/min   GFR calc Af Amer >90  >90 mL/min   Comment: (NOTE)     The eGFR has been calculated using the CKD EPI equation.     This calculation has not been validated in all clinical situations.     eGFR's persistently <90 mL/min signify possible Chronic Kidney     Disease.   Anion gap 12  5 - 15  CBC     Status: Abnormal   Collection Time    05/15/14  3:45 AM      Result Value Ref Range   WBC 17.1 (*) 4.0 - 10.5 K/uL   RBC 5.12  4.22 - 5.81 MIL/uL   Hemoglobin 15.1  13.0 - 17.0 g/dL   HCT 42.7  39.0 - 52.0 %   MCV 83.4  78.0 - 100.0 fL   MCH 29.5  26.0 - 34.0 pg   MCHC 35.4  30.0 - 36.0 g/dL   RDW 12.9  11.5 - 15.5 %   Platelets 170   150 - 400 K/uL  PROTIME-INR     Status: None   Collection Time    05/15/14  3:45 AM      Result Value Ref Range   Prothrombin Time 14.7  11.6 - 15.2 seconds   INR 1.15  0.00 - 1.49   Labs are reviewed and are pertinent for UDS is positive for opioids.  Current Facility-Administered Medications  Medication Dose Route Frequency Provider Last Rate Last Dose  . 0.9 %  sodium chloride infusion   Intravenous Continuous Berle Mull, MD 100 mL/hr at 05/15/14 0800    . acetaminophen (TYLENOL) tablet 650 mg  650 mg Oral Q6H PRN Adeline C Viyuoh, MD      . enoxaparin (LOVENOX) injection 40 mg  40 mg Subcutaneous Q24H Berle Mull, MD   40 mg at 05/14/14 2233  . famotidine (PEPCID) IVPB 20 mg  20 mg Intravenous Q12H  Berle Mull, MD 100 mL/hr at 05/15/14 0935 20 mg at 05/15/14 0935  . naloxone [facility]) injection 0.4 mg  0.4 mg Intravenous Q4H PRN Berle Mull, MD      . ondansetron (ZOFRAN) tablet 4 mg  4 mg Oral Q6H PRN Berle Mull, MD       Or  . ondansetron (ZOFRAN) injection 4 mg  4 mg Intravenous Q6H PRN Berle Mull, MD   4 mg at 05/15/14 0057  . piperacillin-tazobactam (ZOSYN) IVPB 3.375 g  3.375 g Intravenous Q8H Donald Prose Runyon, RPH   3.375 g at 05/15/14 1050  . sodium chloride 0.9 % injection 3 mL  3 mL Intravenous Q12H Berle Mull, MD   3 mL at 05/15/14 7209    Psychiatric Specialty Exam: Physical Exam  ROS  Blood pressure 116/53, pulse 87, temperature 99 F (37.2 C), temperature source Oral, resp. rate 27, height '5\' 9"'  (1.753 m), weight 69.2 kg (152 lb 8.9 oz), SpO2 95.00%.Body mass index is 22.52 kg/(m^2).  General Appearance: Casual  Eye Contact::  Good  Speech:  Clear and Coherent  Volume:  Normal  Mood:  Anxious  Affect:  Appropriate and Congruent  Thought Process:  Coherent and Goal Directed  Orientation:  Full (Time, Place, and Person)  Thought Content:  WDL  Suicidal Thoughts:  No  Homicidal Thoughts:  No  Memory:  Immediate;   Fair Recent;   Fair  Judgement:   Impaired  Insight:  Lacking  Psychomotor Activity:  Decreased  Concentration:  Fair  Recall:  [employer] of Knowledge:Good  Language: Good  Akathisia:  NA  Handed:  Right  AIMS (if indicated):     Assets:  Communication Skills Desire for Improvement Financial Resources/Insurance Housing Intimacy Leisure Time Physical Health Resilience Social Support Talents/Skills  Sleep:      Musculoskeletal: Strength & Muscle Tone: within normal limits Gait & Station: normal Patient leans: N/A  Treatment Plan Summary: Daily contact with patient to assess and evaluate symptoms and progress in treatment Medication management May discharge to out patient substance abuse treatment program when medically stable.  Tiarah Shisler,JANARDHAHA R. 05/15/2014 2:07 PM

## 2014-05-15 NOTE — Progress Notes (Signed)
ANTIBIOTIC CONSULT NOTE - INITIAL  Pharmacy Consult for Zosyn Indication: pneumonia  No Known Allergies  Patient Measurements: Height: 5\' 9"  (175.3 cm) Weight: 152 lb 8.9 oz (69.2 kg) IBW/kg (Calculated) : 70.7   Vital Signs: Temp: 99.1 F (37.3 C) (07/30 0800) Temp src: Oral (07/30 0800) BP: 102/33 mmHg (07/30 0800) Pulse Rate: 97 (07/30 0800) Intake/Output from previous day: 07/29 0701 - 07/30 0700 In: 1040 [I.V.:690; IV Piggyback:350] Out: -  Intake/Output from this shift: Total I/O In: 200 [I.V.:200] Out: -   Labs:  Recent Labs  05/14/14 1902 05/15/14 0345  WBC 12.7* 17.1*  HGB 16.9 15.1  PLT 212 170  CREATININE 1.53* 1.25   Estimated Creatinine Clearance: 89.2 ml/min (by C-G formula based on Cr of 1.25). No results found for this basename: VANCOTROUGH, Leodis BinetVANCOPEAK, VANCORANDOM, GENTTROUGH, GENTPEAK, GENTRANDOM, TOBRATROUGH, TOBRAPEAK, TOBRARND, AMIKACINPEAK, AMIKACINTROU, AMIKACIN,  in the last 72 hours   Microbiology: Recent Results (from the past 720 hour(s))  MRSA PCR SCREENING     Status: Abnormal   Collection Time    05/14/14 11:09 PM      Result Value Ref Range Status   MRSA by PCR INVALID RESULTS, SPECIMEN SENT FOR CULTURE (*) NEGATIVE Final   Comment: SPOKE WITH COBB,D RN [phone] COVINGTON,N                The GeneXpert MRSA Assay (FDA     approved for NASAL specimens     only), is one component of a     comprehensive MRSA colonization     surveillance program. It is not     intended to diagnose MRSA     infection nor to guide or     monitor treatment for     MRSA infections.    Medical History: Past Medical History  Diagnosis Date  . Medical history non-contributory     Assessment: [age] YOM brought via EMS after being found apeic and purple in yard near homeless shelter. Had pinpoint pupils and pt improved following narcan, possibly heroin OD. CXR c/w aspiration pneumonia. EDP suspects prolonged respiratory arrest but currently not  intubated. Zosyn x 1 to ED, then started ceftriaxone/aztihromycin.  MD switching antibiotics to Zosyn for better anaerobic coverage for aspiration pneumonia.  7/30 >> ceftriaxone >> 7/30 7/29 >> azithromycin >> 7/30 7/30 >> Zosyn >>  Tmax: AF WBCs: elevated Renal: SCr slightly elevated, improving, CrCl~88 ml/min No culture data.  Goal of Therapy:  Eradication of infection, doses adjusted per renal clearance  Plan: 1.  Zosyn 3.375g IV q8h (4 hour infusion time). 2.  F/u SCr, clinical course.  Clance Bollunyon, Emmanuel Gruenhagen 05/15/2014,8:46 AM

## 2014-05-15 NOTE — Progress Notes (Signed)
[facility] PROGRESS NOTE  Gerald Oliver ZOX:[id number] DOB: 11-09-1990 DOA: 05/14/2014 PCP: Default, Provider, MD  Assessment/Plan: 1. Drug overdose/heroine overdose  Patient presenting with unresponsiveness with cyanosis and hypoxia. His found to have respiratory acidosis with elevated PCO2. At the time of my evaluation he is significantly much better than his early presentation.  He denies any chronic use of opioids and mentions this was his first heroine use.  His chest x-ray is positive for possible aspiration pneumonia.  -Will consult psych given that he also has history of alcohol abuse and states he just recently completed the [facility] program  -Continue Monitor for withdrawal today for withdrawal in step down unit.  -He did not require BiPAP and was weaned off the nonrebreather. 2.? UTI.  -Await urine cultures on antibiotics for #3 3. Possible aspiration pneumonia.  -WBC trending up will change antibiotics to Zosyn and follow DVT Prophylaxis: subcutaneous Heparin  Nutrition: Regular diet   Code Status: full Family Communication: none Disposition Plan: Monitor in step down   Consultants:  None  Procedures:  None  Antibiotics:  Rocephin and Zithromax 7/29>> 7/30  Zosyn 7/30>>  HPI/Subjective: Complaining of some upper chest pain, denies shortness of breath.  Objective: Filed Vitals:   05/15/14 0800  BP: 102/33  Pulse: 97  Temp: 99.1 F (37.3 Oliver)  Resp: 22    Intake/Output Summary (Last 24 hours) at 05/15/14 0917 Last data filed at 05/15/14 0800  Gross per 24 hour  Intake   1240 ml  Output      0 ml  Net   1240 ml   Filed Weights   05/14/14 2300  Weight: 69.2 kg (152 lb 8.9 oz)    Exam:  General: alert & oriented x 3In NAD Cardiovascular: RRR, nl S1 s2 Respiratory: CTAB Abdomen: soft +BS NT/ND, no masses palpable Extremities: No cyanosis and no edema he    Data Reviewed: Basic Metabolic Panel:  Recent Labs Lab 05/14/14 1902  05/15/14 0345  NA 142 139  K 4.4 4.7  CL 100 102  CO2 20 25  GLUCOSE 186* 126*  BUN 16 18  CREATININE 1.53* 1.25  CALCIUM 8.9 9.0   Liver Function Tests:  Recent Labs Lab 05/14/14 1902 05/15/14 0345  AST 39* 24  ALT 26 26  ALKPHOS 80 61  BILITOT 1.6* 1.8*  PROT 7.4 6.8  ALBUMIN 4.3 4.0   No results found for this basename: LIPASE, AMYLASE,  in the last 168 hours No results found for this basename: AMMONIA,  in the last 168 hours CBC:  Recent Labs Lab 05/14/14 1902 05/15/14 0345  WBC 12.7* 17.1*  HGB 16.9 15.1  HCT 48.1 42.7  MCV 84.2 83.4  PLT 212 170   Cardiac Enzymes: No results found for this basename: CKTOTAL, CKMB, CKMBINDEX, TROPONINI,  in the last 168 hours BNP (last 3 results) No results found for this basename: PROBNP,  in the last 8760 hours CBG: No results found for this basename: GLUCAP,  in the last 168 hours  Recent Results (from the past 240 hour(s))  MRSA PCR SCREENING     Status: Abnormal   Collection Time    05/14/14 11:09 PM      Result Value Ref Range Status   MRSA by PCR INVALID RESULTS, SPECIMEN SENT FOR CULTURE (*) NEGATIVE Final   Comment: SPOKE WITH COBB,D RN [phone] COVINGTON,N                The GeneXpert MRSA Assay (FDA  approved for NASAL specimens     only), is one component of a     comprehensive MRSA colonization     surveillance program. It is not     intended to diagnose MRSA     infection nor to guide or     monitor treatment for     MRSA infections.     Studies: Dg Chest Port 1 View  05/14/2014   CLINICAL DATA:  Overdose with shortness of breath. Question aspiration.  EXAM: PORTABLE CHEST - 1 VIEW  COMPARISON:  Radiographs 04/11/2012 and 06/06/2010.  FINDINGS: 1919 hr. There is new suprahilar airspace disease on the left with mild volume loss. No other focal airspace opacities are identified. There is no pleural effusion or pneumothorax. The heart size and mediastinal contours are otherwise stable.   IMPRESSION: New left upper lobe suprahilar infiltrate worrisome for aspiration and possible developing pneumonia.   Electronically Signed   By: Roxy HorsemanBill  Veazey M.D.   On: 05/14/2014 19:31    Scheduled Meds: . enoxaparin (LOVENOX) injection  40 mg Subcutaneous Q24H  . famotidine (PEPCID) IV  20 mg Intravenous Q12H  . piperacillin-tazobactam (ZOSYN)  IV  3.375 g Intravenous Q8H  . sodium chloride  3 mL Intravenous Q12H   Continuous Infusions: . sodium chloride 100 mL/hr at 05/15/14 0800    Principal Problem:   Drug overdose Active Problems:   Heroin abuse   Aspiration pneumonia   AKI (acute kidney injury)   Unresponsive    Time spent: 35    Memorial HospitalVIYUOH,Gerald Oliver  [facility] Pager [phone]. If 7PM-7AM, please contact night-coverage at [email or web address], password [email or web address] 05/15/2014, 9:17 AM  LOS: 1 day

## 2014-05-15 NOTE — Progress Notes (Signed)
Pt states "Last site of injection here, (pointing to left anterior forearm)". Stated uses "clean needles". No track marks noted on arms or between toes during assessment.   Pt has 6 tattoos some appear professional others do not.   Sclera is clear and white.

## 2014-05-15 NOTE — Plan of Care (Signed)
Problem: Phase I Progression Outcomes Goal: Pain controlled with appropriate interventions Outcome: Adequate for Discharge Pt has made no complaints regarding pain. Vomiting has been his only complaint and it has been resolved.  Goal: Hemodynamically stable Outcome: Adequate for Discharge Pt has had no issues regarding B/P, HR or RR. O2 on [place] at assessment was 6 liters now on 2, anticipating room air shortly.

## 2014-05-16 DIAGNOSIS — F1910 Other psychoactive substance abuse, uncomplicated: Secondary | ICD-10-CM

## 2014-05-16 DIAGNOSIS — F1994 Other psychoactive substance use, unspecified with psychoactive substance-induced mood disorder: Secondary | ICD-10-CM

## 2014-05-16 LAB — CBC
HEMATOCRIT: 39.9 % (ref 39.0–52.0)
Hemoglobin: 13.9 g/dL (ref 13.0–17.0)
MCH: 29.1 pg (ref 26.0–34.0)
MCHC: 34.8 g/dL (ref 30.0–36.0)
MCV: 83.6 fL (ref 78.0–100.0)
Platelets: 144 10*3/uL — ABNORMAL LOW (ref 150–400)
RBC: 4.77 MIL/uL (ref 4.22–5.81)
RDW: 12.9 % (ref 11.5–15.5)
WBC: 9 10*3/uL (ref 4.0–10.5)

## 2014-05-16 LAB — BASIC METABOLIC PANEL
ANION GAP: 11 (ref 5–15)
BUN: 11 mg/dL (ref 6–23)
CHLORIDE: 105 meq/L (ref 96–112)
CO2: 25 meq/L (ref 19–32)
CREATININE: 1.18 mg/dL (ref 0.50–1.35)
Calcium: 9 mg/dL (ref 8.4–10.5)
GFR calc Af Amer: >90 mL/min (ref >90)
GFR calc non Af Amer: 85 mL/min — ABNORMAL LOW (ref >90)
Glucose, Bld: 105 mg/dL — ABNORMAL HIGH (ref 70–99)
POTASSIUM: 4.2 meq/L (ref 3.7–5.3)
SODIUM: 141 meq/L (ref 137–147)

## 2014-05-16 LAB — HEMOGLOBIN A1C
Hgb A1c MFr Bld: 5.2 % (ref <5.7)
MEAN PLASMA GLUCOSE: 103 mg/dL (ref <117)

## 2014-05-16 MED ORDER — AMOXICILLIN-POT CLAVULANATE 875-125 MG PO TABS: 1.0000 | ORAL_TABLET | Freq: Two times a day (BID) | ORAL | Status: DC

## 2014-05-16 NOTE — Consult Note (Signed)
[facility] Consult   Reason for Consult:  Heroin overdose Referring Physician: Sheila Oats, MD   Gerald Oliver is an 23 y.o. male. Total Time spent with patient: 45 minutes  Assessment: AXIS I:  Substance Abuse and Substance Induced Mood Disorder AXIS II:  Deferred AXIS III:   Past Medical History  Diagnosis Date  . Medical history non-contributory    AXIS IV:  other psychosocial or environmental problems, problems related to social environment and problems with primary support group AXIS V:  51-60 moderate symptoms  Plan: Case discussed with Sheila Oats, MD No evidence of imminent risk to self or others at present.   Patient does not meet criteria for psychiatric inpatient admission. Supportive therapy provided about ongoing stressors. Discussed crisis plan, support from social network, calling 911, coming to the Emergency Department, and calling Suicide Hotline. Appreciate psychiatric consultation and will sign off at this time Please contact [phone] if needs further assistance  Subjective:   Gerald Oliver is a 23 y.o. male patient admitted with heroin overdose.  HPI:  Gerald Oliver is a 23 y.o. Male seen for psychiatric consultation for substance abuse and possible treatment needs. Patient has successfully completed [facility] - substance abuse rehabilitation treatment and than relapsed yesterday by drinking some alcohol and IV heroin about 20 grams along with another friend. He feels regrets for his slip and says he is gratful for bing alive and feels he is in control and wants to stay away from drugs and look for temp job. He has mother, her  BF and brother but not supportive to him. Reportedly family history fo alcohol and substance abuse is present. Patient does not consent to contact his probation officer. He was charged for assault and has 30 days jail time which will be activated on 05/19/14. Patient denied current symptoms of depression, anxiety  and psychosis. He has denied current suicidal or homicidal ideation, intention or plans. He has no evidence of psychosis. He has contract for safety and willing to follow out patient program for substance abuse. He complaints of nausea, headache, dizziness without vertigo, fatigue but no vomitings, UDS is positive for opioids.   Review of Systems: as mentioned in the history of present illness.  A Comprehensive review of the other systems is negative.  Interval history: Patient is seen today for psychiatric consultation followup regarding appropriate disposition. Patient continued to endorse no cravings for drugs and no known withdrawal symptoms. Patient has stated he is feeling better and wanted to go home and relax this weekend before going to jail for serving 30 days granted for him for the assault charges in the past. Patient refuses intensive outpatient services and also inpatient rehabilitation services. Patient does not meet criteria for detox treatment as he has relapsed only one day.  HPI Elements:   Location:  substance abuse. Quality:  poor. Severity:  acute. Timing:  unknown.  Past Psychiatric History: Past Medical History  Diagnosis Date  . Medical history non-contributory     reports that he quit smoking about 6 years ago. His smoking use included Cigarettes. He smoked 0.00 packs per day. He has never used smokeless tobacco. He reports that he drinks alcohol. He reports that he uses illicit drugs. History reviewed. No pertinent family history.   Living Arrangements: Parent (mother)   Abuse/Neglect [facility]) Physical Abuse: Denies Verbal Abuse: Denies Sexual Abuse: Denies Allergies:  No Known Allergies  ACT Assessment Complete:  NO Objective: Blood pressure 109/47, pulse  54, temperature 97.9 F (36.6 C), temperature source Oral, resp. rate 20, height '5\' 9"'  (1.753 m), weight 69.2 kg (152 lb 8.9 oz), SpO2 100.00%.Body mass index is 22.52 kg/(m^2). Results for orders placed  during the hospital encounter of 05/14/14 (from the past 72 hour(s))  CBC     Status: Abnormal   Collection Time    05/14/14  7:02 PM      Result Value Ref Range   WBC 12.7 (*) 4.0 - 10.5 K/uL   RBC 5.71  4.22 - 5.81 MIL/uL   Hemoglobin 16.9  13.0 - 17.0 g/dL   HCT 48.1  39.0 - 52.0 %   MCV 84.2  78.0 - 100.0 fL   MCH 29.6  26.0 - 34.0 pg   MCHC 35.1  30.0 - 36.0 g/dL   RDW 13.2  11.5 - 15.5 %   Platelets 212  150 - 400 K/uL  COMPREHENSIVE METABOLIC PANEL     Status: Abnormal   Collection Time    05/14/14  7:02 PM      Result Value Ref Range   Sodium 142  137 - 147 mEq/L   Potassium 4.4  3.7 - 5.3 mEq/L   Chloride 100  96 - 112 mEq/L   CO2 20  19 - 32 mEq/L   Glucose, Bld 186 (*) 70 - 99 mg/dL   BUN 16  6 - 23 mg/dL   Creatinine, Ser 1.53 (*) 0.50 - 1.35 mg/dL   Calcium 8.9  8.4 - 10.5 mg/dL   Total Protein 7.4  6.0 - 8.3 g/dL   Albumin 4.3  3.5 - 5.2 g/dL   AST 39 (*) 0 - 37 U/L   ALT 26  0 - 53 U/L   Alkaline Phosphatase 80  39 - 117 U/L   Total Bilirubin 1.6 (*) 0.3 - 1.2 mg/dL   GFR calc non Af Amer 62 (*) >90 mL/min   GFR calc Af Amer 72 (*) >90 mL/min   Comment: (NOTE)     The eGFR has been calculated using the CKD EPI equation.     This calculation has not been validated in all clinical situations.     eGFR's persistently <90 mL/min signify possible Chronic Kidney     Disease.   Anion gap 22 (*) 5 - 15  ETHANOL     Status: None   Collection Time    05/14/14  7:02 PM      Result Value Ref Range   Alcohol, Ethyl (B) <11  0 - 11 mg/dL   Comment:            LOWEST DETECTABLE LIMIT FOR     SERUM ALCOHOL IS 11 mg/dL     FOR MEDICAL PURPOSES ONLY  ACETAMINOPHEN LEVEL     Status: None   Collection Time    05/14/14  7:02 PM      Result Value Ref Range   Acetaminophen (Tylenol), Serum <15.0  10 - 30 ug/mL   Comment:            THERAPEUTIC CONCENTRATIONS VARY     SIGNIFICANTLY. A RANGE OF 10-30     ug/mL MAY BE AN EFFECTIVE     CONCENTRATION FOR MANY PATIENTS.      HOWEVER, SOME ARE BEST TREATED     AT CONCENTRATIONS OUTSIDE THIS     RANGE.     ACETAMINOPHEN CONCENTRATIONS     >150 ug/mL AT 4 HOURS AFTER     INGESTION AND >50 ug/mL  AT 12     HOURS AFTER INGESTION ARE     OFTEN ASSOCIATED WITH TOXIC     REACTIONS.  SALICYLATE LEVEL     Status: Abnormal   Collection Time    05/14/14  7:02 PM      Result Value Ref Range   Salicylate Lvl <1.6 (*) 2.8 - 20.0 mg/dL  URINE RAPID DRUG SCREEN (HOSP PERFORMED)     Status: Abnormal   Collection Time    05/14/14  8:35 PM      Result Value Ref Range   Opiates POSITIVE (*) NONE DETECTED   Cocaine NONE DETECTED  NONE DETECTED   Benzodiazepines NONE DETECTED  NONE DETECTED   Amphetamines NONE DETECTED  NONE DETECTED   Tetrahydrocannabinol NONE DETECTED  NONE DETECTED   Barbiturates NONE DETECTED  NONE DETECTED   Comment:            DRUG SCREEN FOR MEDICAL PURPOSES     ONLY.  IF CONFIRMATION IS NEEDED     FOR ANY PURPOSE, NOTIFY LAB     WITHIN 5 DAYS.                LOWEST DETECTABLE LIMITS     FOR URINE DRUG SCREEN     Drug Class       Cutoff (ng/mL)     Amphetamine      1000     Barbiturate      200     Benzodiazepine   109     Tricyclics       604     Opiates          300     Cocaine          300     THC              50  URINALYSIS, ROUTINE W REFLEX MICROSCOPIC     Status: Abnormal   Collection Time    05/14/14  8:35 PM      Result Value Ref Range   Color, Urine YELLOW  YELLOW   APPearance CLOUDY (*) CLEAR   Specific Gravity, Urine 1.022  1.005 - 1.030   pH 5.0  5.0 - 8.0   Glucose, UA NEGATIVE  NEGATIVE mg/dL   Hgb urine dipstick TRACE (*) NEGATIVE   Bilirubin Urine NEGATIVE  NEGATIVE   Ketones, ur NEGATIVE  NEGATIVE mg/dL   Protein, ur 100 (*) NEGATIVE mg/dL   Urobilinogen, UA 0.2  0.0 - 1.0 mg/dL   Nitrite NEGATIVE  NEGATIVE   Leukocytes, UA NEGATIVE  NEGATIVE  URINE MICROSCOPIC-ADD ON     Status: Abnormal   Collection Time    05/14/14  8:35 PM      Result Value Ref Range    Squamous Epithelial / LPF RARE  RARE   WBC, UA 3-6  <3 WBC/hpf   RBC / HPF 0-2  <3 RBC/hpf   Bacteria, UA FEW (*) RARE   Urine-Other SPERM PRESENT    BLOOD GAS, VENOUS     Status: Abnormal   Collection Time    05/14/14  9:33 PM      Result Value Ref Range   FIO2 1.00     Delivery systems NON-REBREATHER OXYGEN MASK     pH, Ven 7.288  7.250 - 7.300   pCO2, Ven 52.5 (*) 45.0 - 50.0 mmHg   pO2, Ven 41.6  30.0 - 45.0 mmHg   Bicarbonate 24.4 (*) 20.0 - 24.0 mEq/L   TCO2  21.5  0 - 100 mmol/L   Acid-base deficit 2.8 (*) 0.0 - 2.0 mmol/L   O2 Saturation 72.6     Patient temperature 98.6     Collection site VEIN     Drawn by COLLECTED BY LABORATORY     Sample type VENOUS    MRSA PCR SCREENING     Status: Abnormal   Collection Time    05/14/14 11:09 PM      Result Value Ref Range   MRSA by PCR INVALID RESULTS, SPECIMEN SENT FOR CULTURE (*) NEGATIVE   Comment: SPOKE WITH COBB,D RN [phone] [id number] COVINGTON,N                The GeneXpert MRSA Assay (FDA     approved for NASAL specimens     only), is one component of a     comprehensive MRSA colonization     surveillance program. It is not     intended to diagnose MRSA     infection nor to guide or     monitor treatment for     MRSA infections.  MRSA CULTURE     Status: None   Collection Time    05/14/14 11:09 PM      Result Value Ref Range   Specimen Description NASOPHARYNGEAL     Special Requests NONE     Culture       Value: NO SUSPICIOUS COLONIES, CONTINUING TO HOLD     Performed at [facility]   Report Status PENDING    COMPREHENSIVE METABOLIC PANEL     Status: Abnormal   Collection Time    05/15/14  3:45 AM      Result Value Ref Range   Sodium 139  137 - 147 mEq/L   Potassium 4.7  3.7 - 5.3 mEq/L   Chloride 102  96 - 112 mEq/L   CO2 25  19 - 32 mEq/L   Glucose, Bld 126 (*) 70 - 99 mg/dL   BUN 18  6 - 23 mg/dL   Creatinine, Ser 1.25  0.50 - 1.35 mg/dL   Calcium 9.0  8.4 - 10.5 mg/dL   Total Protein 6.8  6.0 -  8.3 g/dL   Albumin 4.0  3.5 - 5.2 g/dL   AST 24  0 - 37 U/L   ALT 26  0 - 53 U/L   Alkaline Phosphatase 61  39 - 117 U/L   Total Bilirubin 1.8 (*) 0.3 - 1.2 mg/dL   GFR calc non Af Amer 79 (*) >90 mL/min   GFR calc Af Amer >90  >90 mL/min   Comment: (NOTE)     The eGFR has been calculated using the CKD EPI equation.     This calculation has not been validated in all clinical situations.     eGFR's persistently <90 mL/min signify possible Chronic Kidney     Disease.   Anion gap 12  5 - 15  CBC     Status: Abnormal   Collection Time    05/15/14  3:45 AM      Result Value Ref Range   WBC 17.1 (*) 4.0 - 10.5 K/uL   RBC 5.12  4.22 - 5.81 MIL/uL   Hemoglobin 15.1  13.0 - 17.0 g/dL   HCT 42.7  39.0 - 52.0 %   MCV 83.4  78.0 - 100.0 fL   MCH 29.5  26.0 - 34.0 pg   MCHC 35.4  30.0 - 36.0 g/dL   RDW 12.9  11.5 - 15.5 %   Platelets 170  150 - 400 K/uL  PROTIME-INR     Status: None   Collection Time    05/15/14  3:45 AM      Result Value Ref Range   Prothrombin Time 14.7  11.6 - 15.2 seconds   INR 1.15  0.00 - 1.49  HIV ANTIBODY (ROUTINE TESTING)     Status: None   Collection Time    05/15/14  3:45 AM      Result Value Ref Range   HIV 1&2 Ab, 4th Generation NONREACTIVE  NONREACTIVE   Comment: (NOTE)     A NONREACTIVE HIV Ag/Ab result does not exclude HIV infection since     the time frame for seroconversion is variable. If acute HIV infection     is suspected, a HIV-1 RNA Qualitative TMA test is recommended.     HIV-1/2 Antibody Diff         Not indicated.     HIV-1 RNA, Qual TMA           Not indicated.     PLEASE NOTE: This information has been disclosed to you from records     whose confidentiality may be protected by state law. If your state     requires such protection, then the state law prohibits you from making     any further disclosure of the information without the specific written     consent of the person to whom it pertains, or as otherwise permitted     by law. A  general authorization for the release of medical or other     information is NOT sufficient for this purpose.     The performance of this assay has not been clinically validated in     patients less than 69 years old.     Performed at [facility]  CBC     Status: Abnormal   Collection Time    05/16/14  8:40 AM      Result Value Ref Range   WBC 9.0  4.0 - 10.5 K/uL   RBC 4.77  4.22 - 5.81 MIL/uL   Hemoglobin 13.9  13.0 - 17.0 g/dL   HCT 39.9  39.0 - 52.0 %   MCV 83.6  78.0 - 100.0 fL   MCH 29.1  26.0 - 34.0 pg   MCHC 34.8  30.0 - 36.0 g/dL   RDW 12.9  11.5 - 15.5 %   Platelets 144 (*) 150 - 400 K/uL  BASIC METABOLIC PANEL     Status: Abnormal   Collection Time    05/16/14  8:40 AM      Result Value Ref Range   Sodium 141  137 - 147 mEq/L   Potassium 4.2  3.7 - 5.3 mEq/L   Chloride 105  96 - 112 mEq/L   CO2 25  19 - 32 mEq/L   Glucose, Bld 105 (*) 70 - 99 mg/dL   BUN 11  6 - 23 mg/dL   Creatinine, Ser 1.18  0.50 - 1.35 mg/dL   Calcium 9.0  8.4 - 10.5 mg/dL   GFR calc non Af Amer 85 (*) >90 mL/min   GFR calc Af Amer >90  >90 mL/min   Comment: (NOTE)     The eGFR has been calculated using the CKD EPI equation.     This calculation has not been validated in all clinical situations.     eGFR's persistently <90 mL/min signify possible Chronic Kidney  Disease.   Anion gap 11  5 - 15   Labs are reviewed and are pertinent for UDS is positive for opioids.  Current Facility-Administered Medications  Medication Dose Route Frequency Provider Last Rate Last Dose  . 0.9 %  sodium chloride infusion   Intravenous Continuous Berle Mull, MD 100 mL/hr at 05/15/14 1700    . acetaminophen (TYLENOL) tablet 650 mg  650 mg Oral Q6H PRN Sheila Oats, MD   650 mg at 05/16/14 0558  . enoxaparin (LOVENOX) injection 40 mg  40 mg Subcutaneous Q24H Berle Mull, MD   40 mg at 05/15/14 2127  . naloxone [facility]) injection 0.4 mg  0.4 mg Intravenous Q4H PRN Berle Mull, MD      .  ondansetron (ZOFRAN) tablet 4 mg  4 mg Oral Q6H PRN Berle Mull, MD       Or  . ondansetron (ZOFRAN) injection 4 mg  4 mg Intravenous Q6H PRN Berle Mull, MD   4 mg at 05/15/14 0057  . piperacillin-tazobactam (ZOSYN) IVPB 3.375 g  3.375 g Intravenous Q8H Donald Prose Runyon, RPH   3.375 g at 05/16/14 1006  . sodium chloride 0.9 % injection 3 mL  3 mL Intravenous Q12H Berle Mull, MD   3 mL at 05/15/14 2131    Psychiatric Specialty Exam: Physical Exam  ROS  Blood pressure 109/47, pulse 54, temperature 97.9 F (36.6 C), temperature source Oral, resp. rate 20, height '5\' 9"'  (1.753 m), weight 69.2 kg (152 lb 8.9 oz), SpO2 100.00%.Body mass index is 22.52 kg/(m^2).  General Appearance: Casual  Eye Contact::  Good  Speech:  Clear and Coherent  Volume:  Normal  Mood:  Anxious  Affect:  Appropriate and Congruent  Thought Process:  Coherent and Goal Directed  Orientation:  Full (Time, Place, and Person)  Thought Content:  WDL  Suicidal Thoughts:  No  Homicidal Thoughts:  No  Memory:  Immediate;   Fair Recent;   Fair  Judgement:  Impaired  Insight:  Lacking  Psychomotor Activity:  Decreased  Concentration:  Fair  Recall:  [employer] of Knowledge:Good  Language: Good  Akathisia:  NA  Handed:  Right  AIMS (if indicated):     Assets:  Communication Skills Desire for Improvement Financial Resources/Insurance Housing Intimacy Leisure Time Physical Health Resilience Social Support Talents/Skills  Sleep:      Musculoskeletal: Strength & Muscle Tone: within normal limits Gait & Station: normal Patient leans: N/A  Treatment Plan Summary: Daily contact with patient to assess and evaluate symptoms and progress in treatment Medication management Patient was not interested in patient substance abuse rehabilitation as he was recently graduated Patient is referred to out patient substance abuse treatment program when medically stable.  Cliffie Gingras,JANARDHAHA R. 05/16/2014 12:46 PM

## 2014-05-16 NOTE — Discharge Summary (Addendum)
Physician Discharge Summary  Gerald Oliver:[id number] DOB: 04/04/1991 DOA: 05/14/2014  PCP: Default, Provider, MD  Admit date: 05/14/2014 Discharge date: 05/16/2014  Time spent: <30 minutes  Recommendations for Outpatient Follow-up:  Follow-up Information   Please follow up. (Outpatient treatment program, for Heroine as directed per psychiatry-social work to give info)       Please follow up. (PCP in1-2weeks, call for appt upon discharge -case manager to assist)      pending labs -Followup on A1c results with PCP   Discharge Diagnoses:  Principal Problem:   Drug overdose Active Problems:   Heroin abuse   Aspiration pneumonia   AKI (acute kidney injury)   Unresponsive   Discharge Condition: Improved/stable  Diet recommendation: Regular  Filed Weights   05/14/14 2300  Weight: 69.2 kg (152 lb 8.9 oz)    History of present illness:  Patient is a 23 y.o. male with no significant Past medical history who presented with unresponsiveness. He was found near homeless shelter apneic and cyanotic by the EMS. He had pinpoint pupils and therefore the EMS gave him Narcan with that she was awake. He was significantly hypoxic and therefore was placed on nonrebreather initially was having 80% saturation on 100% nonrebreather. There was gradual improvement in his mental status with her present dose of Narcan and the patient was recommended for admission.  At the time of my evaluation the patient was awake and alert denies abusing any other drugs. Denies any prior medical history of coronary prescription medication use. He denied alcohol use on admission but subsequently stated that it had a history of alcohol abuse and had just completed the [facility] program for alcohol abuse He denied  any suicidal or homicidal ideation. He was admitted for further evaluation and management.    Hospital Course:  1. Drug overdose/heroine overdose  As discussed above Patient presented with  unresponsiveness with cyanosis and hypoxia. His found to have respiratory acidosis with elevated PCO2. He was initially placed on nonrebreather. At the time of evaluation per admitting M.D. he was significantly much better than his earlier presentation.  He denies any chronic use of opioids and stated this was his first heroine use.  His chest x-ray is positive for possible aspiration pneumonia.  -He was admitted to the ICU and started on empiric antibiotics for aspiration pneumonia -He was weaned off the nonrebreather following admission -Psychiatry was consulted given this heroine overdose and with a history of alcohol abuse and stating he just recently completed the [facility] program  -Patient was seen by psychiatry and he did not voluntarily want to go for an inpatient rehab program and the psychiatrist stated that he is safe at discharge to followup an outpatient treatment program>> social work to assist with giving patient in the or to discharge. 2.? UTI.  - Urinalysis showed mild pyuria, patient was empirically treated with antibiotics  3. Possible aspiration pneumonia.  - As discussed above chest x-ray on admission was consistent with aspiration pneumonia and he had a leukocytosis.leukocytosis was worsening initially on Rocephin and Zithromax, so he was changed to Zosyn .following the change in antibiotics his leukocytosis resolved on followup today, he is oxygenating well on room air, and he remains afebrile. He is medically ready for discharge at this time on Augmentin and is to followup with PCP -case manager to assist patient with setting up PCP  4. Elevated blood glucose -patient had elevated blood sugars of186 on 126(this was discussed with patient) while in the hospital and  an A1c was done the results are pending at this time -He is to followup with PCP for further monitoring of his blood glucose and A1c result, and further management as clinically  appropriate   Procedures:  none  Consultations:   psychiatrist  Discharge Exam: Filed Vitals:   05/16/14 1200  BP: 109/47  Pulse: 54  Temp:   Resp: 20   Exam:  General: alert & oriented x 3In NAD  Cardiovascular: RRR, nl S1 s2  Respiratory: CTAB, no crackles and no wheezes Abdomen: soft +BS NT/ND, no masses palpable  Extremities: No cyanosis and no edema     Discharge Instructions You were cared for by a hospitalist during your hospital stay. If you have any questions about your discharge medications or the care you received while you were in the hospital after you are discharged, you can call the unit and asked to speak with the hospitalist on call if the hospitalist that took care of you is not available. Once you are discharged, your primary care physician will handle any further medical issues. Please note that NO REFILLS for any discharge medications will be authorized once you are discharged, as it is imperative that you return to your primary care physician (or establish a relationship with a primary care physician if you do not have one) for your aftercare needs so that they can reassess your need for medications and monitor your lab values.  Discharge Instructions   Diet general    Complete by:  As directed      Increase activity slowly    Complete by:  As directed             Medication List         amoxicillin-clavulanate 875-125 MG per tablet  Commonly known as:  AUGMENTIN  Take 1 tablet by mouth 2 (two) times daily.       No Known Allergies     Follow-up Information   Please follow up. (Outpatient treatment program, for Heroine as directed per psychiatry-social work to give info)       Please follow up. (PCP in1-2weeks, call for appt upon discharge -case manager to assist)        The results of significant diagnostics from this hospitalization (including imaging, microbiology, ancillary and laboratory) are listed below for reference.     Significant Diagnostic Studies: Dg Chest Port 1 View  05/14/2014   CLINICAL DATA:  Overdose with shortness of breath. Question aspiration.  EXAM: PORTABLE CHEST - 1 VIEW  COMPARISON:  Radiographs 04/11/2012 and 06/06/2010.  FINDINGS: 1919 hr. There is new suprahilar airspace disease on the left with mild volume loss. No other focal airspace opacities are identified. There is no pleural effusion or pneumothorax. The heart size and mediastinal contours are otherwise stable.  IMPRESSION: New left upper lobe suprahilar infiltrate worrisome for aspiration and possible developing pneumonia.   Electronically Signed   By: Roxy Horseman M.D.   On: 05/14/2014 19:31    Microbiology: Recent Results (from the past 240 hour(s))  MRSA PCR SCREENING     Status: Abnormal   Collection Time    05/14/14 11:09 PM      Result Value Ref Range Status   MRSA by PCR INVALID RESULTS, SPECIMEN SENT FOR CULTURE (*) NEGATIVE Final   Comment: SPOKE WITH COBB,D RN [phone] [id number] C[place],N                The GeneXpert MRSA Assay (FDA     approved for  NASAL specimens     only), is one component of a     comprehensive MRSA colonization     surveillance program. It is not     intended to diagnose MRSA     infection nor to guide or     monitor treatment for     MRSA infections.  MRSA CULTURE     Status: None   Collection Time    05/14/14 11:09 PM      Result Value Ref Range Status   Specimen Description NASOPHARYNGEAL   Final   Special Requests NONE   Final   Culture     Final   Value: NO SUSPICIOUS COLONIES, CONTINUING TO HOLD     Performed at [facility]   Report Status PENDING   Incomplete     Labs: Basic Metabolic Panel:  Recent Labs Lab 05/14/14 1902 05/15/14 0345 05/16/14 0840  NA 142 139 141  K 4.4 4.7 4.2  CL 100 102 105  CO2 20 25 25   GLUCOSE 186* 126* 105*  BUN 16 18 11   CREATININE 1.53* 1.25 1.18  CALCIUM 8.9 9.0 9.0   Liver Function Tests:  Recent Labs Lab 05/14/14 1902  05/15/14 0345  AST 39* 24  ALT 26 26  ALKPHOS 80 61  BILITOT 1.6* 1.8*  PROT 7.4 6.8  ALBUMIN 4.3 4.0   No results found for this basename: LIPASE, AMYLASE,  in the last 168 hours No results found for this basename: AMMONIA,  in the last 168 hours CBC:  Recent Labs Lab 05/14/14 1902 05/15/14 0345 05/16/14 0840  WBC 12.7* 17.1* 9.0  HGB 16.9 15.1 13.9  HCT 48.1 42.7 39.9  MCV 84.2 83.4 83.6  PLT 212 170 144*   Cardiac Enzymes: No results found for this basename: CKTOTAL, CKMB, CKMBINDEX, TROPONINI,  in the last 168 hours BNP: BNP (last 3 results) No results found for this basename: PROBNP,  in the last 8760 hours CBG: No results found for this basename: GLUCAP,  in the last 168 hours     Signed:  Yasuo Phimmasone C  [facility] 05/16/2014, 1:18 PM

## 2014-05-16 NOTE — Progress Notes (Signed)
Clinical Social Work Department CLINICAL SOCIAL WORK PSYCHIATRY SERVICE LINE ASSESSMENT 05/16/2014  Patient:  Gerald Oliver  Account:  [id number]  [place] Date:  05/14/2014  Clinical Social Worker:  Sindy Messing, LCSW  Date/Time:  05/16/2014 02:15 PM Referred by:  Physician  Date referred:  05/16/2014 Reason for Referral  Substance Abuse   Presenting Symptoms/Problems (In the person's/family's own words):   Psych consulted due to overdose on substances.   Abuse/Neglect/Trauma History (check all that apply)  Denies history   Abuse/Neglect/Trauma Comments:   Psychiatric History (check all that apply)  Denies history   Psychiatric medications:  None   Current Mental Health Hospitalizations/Previous Mental Health History:   Patient denies any previous or current mental health concerns.   Current provider:   None   Place and Date:   N/A   Current Medications:   Scheduled Meds:      . enoxaparin (LOVENOX) injection  40 mg Subcutaneous Q24H  . piperacillin-tazobactam (ZOSYN)  IV  3.375 g Intravenous Q8H  . sodium chloride  3 mL Intravenous Q12H        Continuous Infusions:      . sodium chloride 100 mL/hr at 05/16/14 1400          PRN Meds:.acetaminophen, naLOXone (NARCAN)  injection, ondansetron (ZOFRAN) IV, ondansetron       Previous Impatient Admission/Date/Reason:   None reported   Emotional Health / Current Symptoms    Suicide/Self Harm  None reported   Suicide attempt in the past:   Patient denies that admission was a suicide attempt. Patient denies any current SI or HI.   Other harmful behavior:   None reported   Psychotic/Dissociative Symptoms  None reported   Other Psychotic/Dissociative Symptoms:    Attention/Behavioral Symptoms  Withdrawn   Other Attention / Behavioral Symptoms:   Patient minimally engaged during assessment.    Cognitive Impairment  Within Normal Limits   Other Cognitive Impairment:   Patient alert and oriented.    Mood  and Adjustment  Flat    Stress, Anxiety, Trauma, Any Recent Loss/Stressor  Current Legal Problems/Pending Court Date   Anxiety (frequency):   N/A   Phobia (specify):   N/A   Compulsive behavior (specify):   N/A   Obsessive behavior (specify):   N/A   Other:   Patient reports he was recently released from jail and has to turn himself in on Monday due to probation violation.   Substance Abuse/Use  Current substance use   SBIRT completed (please refer for detailed history):  N  Self-reported substance use:   Patient refused to complete SBIRT. Patient reports he is not an addict and does not need any resources. Patient reports he completed [facility] and does not need any further resources. 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   Urinary Drug Screen Completed:  Y Alcohol level:   <11    Environmental/Housing/Living Arrangement  Stable housing   Who is in the home:   Mom and brother   Emergency contact:  [place]   Patient's Strengths and Goals (patient's own words):   Patient reports that he has supportive friends and family.   Clinical Social Worker's Interpretive Summary:   CSW received referral in order to complete psychosocial assessment. CSW reviewed chart and staffed case with psych MD. CSW met with patient at bedside. CSW introduced myself and explained role.    Patient reports that he  lives at home with mother and brother. Patient states that he was recently in jail and has to turn himself back in on Monday to serve 30 days due to probation violation. Patient reported he did not want to talk about his charges.    Patient reports on day of admission he was hanging out with friends and decided to use heroin. Patient adamant that he is not an addict and does not need treatment. Patient reports he was forced to complete program at [facility] and does not need any further resources. Patient  reports he was using socially but does not have any emotional connections to substance use. Patient denies any current use and reports he will remain sober until Monday.    CSW and patient spoke about long term plans and goals. Patient reports he has worked in [employer] in the past and is working on his resume. Patient is hopeful that after he serves his time that he can find a job in September. Patient reports that family is supportive until that time and will assist him as needed.    Patient anxious to DC home and reports he does not need to talk with CSW. CSW is signing off but available if further needs arise.   Disposition:  Psych Clinical Social Worker signing off   [place], [place] [phone]

## 2014-05-16 NOTE — Clinical Documentation Improvement (Signed)
Possible Clinical Conditions?  Acute Respiratory Failure Acute on Chronic Respiratory Failure Chronic Respiratory Failure Acute Respiratory Insufficiency Acute Respiratory Insufficiency following surgery or trauma Other Condition Cannot Clinically Determine   Supporting Information: Signs & Symptoms:"found apneic and purple in the yard near the homeless shelter, the patient was given intranasal Narcan and an IV was placed, he had improvement after the Narcan however the patient had remained hypoxic and required a non-rebreather to maintain an oxygen saturation of 80%."   Thank You, Nevin BloodgoodJoan B Casy Brunetto, RN, BSN, CCDS,Clinical Documentation Specialist:  [phone]  [phone]=[facility] [place]- Health Information Management

## 2014-05-17 LAB — MRSA CULTURE

## 2014-05-19 LAB — HEPATITIS B E ANTIGEN: Hep B E Ag: NONREACTIVE

## 2015-05-18 ENCOUNTER — Emergency Department (HOSPITAL_COMMUNITY)
Admission: EM | Admit: 2015-05-18 | Discharge: 2015-05-18 | Disposition: A | Discharge status: Home / Self Care | Payer: Self-pay | Attending: Emergency Medicine | Admitting: Emergency Medicine

## 2015-05-18 ENCOUNTER — Encounter (HOSPITAL_COMMUNITY): Payer: Self-pay | Admitting: Nurse Practitioner

## 2015-05-18 ENCOUNTER — Emergency Department (HOSPITAL_COMMUNITY): Payer: Self-pay

## 2015-05-18 DIAGNOSIS — Z87891 Personal history of nicotine dependence: Secondary | ICD-10-CM | POA: Insufficient documentation

## 2015-05-18 DIAGNOSIS — L02512 Cutaneous abscess of left hand: Secondary | ICD-10-CM | POA: Insufficient documentation

## 2015-05-18 DIAGNOSIS — Z792 Long term (current) use of antibiotics: Secondary | ICD-10-CM | POA: Insufficient documentation

## 2015-05-18 MED ORDER — LIDOCAINE-EPINEPHRINE 1 %-1:100000 IJ SOLN
10.0000 mL | Freq: Once | INTRAMUSCULAR | Status: AC
  Administered 2015-05-18: 10 mL
  Filled 2015-05-18: qty 1

## 2015-05-18 MED ORDER — SULFAMETHOXAZOLE-TRIMETHOPRIM 800-160 MG PO TABS: 1.0000 | ORAL_TABLET | Freq: Two times a day (BID) | ORAL | Status: AC

## 2015-05-18 MED ORDER — NAPROXEN 500 MG PO TABS: 500.0000 mg | ORAL_TABLET | Freq: Two times a day (BID) | ORAL | Status: DC

## 2015-05-18 NOTE — ED Notes (Signed)
Dr Trevor at bedside. 

## 2015-05-18 NOTE — ED Provider Notes (Signed)
CSN: [id number]     Arrival date & time 05/18/15  4098 History  This chart was scribed for Eber Hong, MD by Budd Palmer, ED Scribe. This patient was seen in room TR08C/TR08C and the patient's care was started at 6:51 PM.    Chief Complaint  Patient presents with  . Wound Infection   The history is provided by the patient. No language interpreter was used.   HPI Comments: Gerald Oliver is a 24 y.o. male who presents to the Emergency Department complaining of infection of a wound sustained when he cut his left middle finger on a piece of sheet metal 4 days ago. He notes associated increase in throbbing pain and swelling. Pt denies fever and vomiting.  Past Medical History  Diagnosis Date  . Medical history non-contributory    Past Surgical History  Procedure Laterality Date  . Wisdom tooth extraction     History reviewed. No pertinent family history. History  Substance Use Topics  . Smoking status: Former Smoker    Types: Cigarettes    Quit date: 05/14/2008  . Smokeless tobacco: Never Used  . Alcohol Use: No     Comment: rarely    Review of Systems  Constitutional: Negative for fever.  Gastrointestinal: Negative for vomiting.  Skin: Positive for wound.    Allergies  Review of patient's allergies indicates no known allergies.  Home Medications   Prior to Admission medications   Medication Sig Start Date End Date Taking? Authorizing Provider  amoxicillin-clavulanate (AUGMENTIN) 875-125 MG per tablet Take 1 tablet by mouth 2 (two) times daily. 05/16/14   Kela Millin, MD  naproxen (NAPROSYN) 500 MG tablet Take 1 tablet (500 mg total) by mouth 2 (two) times daily with a meal. 05/18/15   Eber Hong, MD  sulfamethoxazole-trimethoprim (BACTRIM DS,SEPTRA DS) 800-160 MG per tablet Take 1 tablet by mouth 2 (two) times daily. 05/18/15 05/25/15  Eber Hong, MD   BP 143/85 mmHg  Pulse 68  Temp(Src) 98.1 F (36.7 C) (Oral)  Resp 18  Wt 149 lb 11.2 oz (67.903 kg)  SpO2  98% Physical Exam  Constitutional: He appears well-developed and well-nourished.  HENT:  Head: Normocephalic and atraumatic.  Eyes: Conjunctivae are normal. Right eye exhibits no discharge. Left eye exhibits no discharge.  Pulmonary/Chest: Effort normal. No respiratory distress.  Musculoskeletal: He exhibits edema.  Left middle finger is mildly swollen around the PIP with associated swelling over the dorsal joint and what appears to be a 7mm in diameter pocket of fluid with decreased ROM of the finger.  Neurological: He is alert. Coordination normal.  Skin: Skin is warm and dry. No rash noted. He is not diaphoretic. No erythema.  Psychiatric: He has a normal mood and affect.  Nursing note and vitals reviewed.   ED Course  Procedures  DIAGNOSTIC STUDIES: Oxygen Saturation is 98% on RA, normal by my interpretation.    COORDINATION OF CARE: 6:53 PM - Discussed plans to order an XR and numbing medication for an I&D. Pt advised of plan for treatment and pt agrees.  7:32 PM - Performed digital nerve block for future I&D.  INCISION AND DRAINAGE PROCEDURE NOTE: Patient identification was confirmed and verbal consent was obtained. This procedure was performed by Eber Hong, MD at 7:46 PM. Site: Left Middle Finger Sterile procedures observed Needle size: 25   Anesthetic used (type and amt): 10 mL Xylocaine w/Epi injection Blade size: 11 Drainage: bloody, purulent drainage Complexity: Simple Packing used None  Site anesthetized, incision  made over site, wound drained and explored loculations, rinsed with copious amounts of normal saline, wound packed with sterile gauze, covered with dry, sterile dressing.  Pt tolerated procedure well without complications.  Instructions for care discussed verbally and pt provided with additional written instructions for homecare and f/u.  Labs Review Labs Reviewed - No data to display  Imaging Review Dg Finger Middle Left  05/18/2015   CLINICAL  DATA:  Remote laceration to the PIP joint of the left middle finger. Persistent Pain and swelling over the past several days.  EXAM: LEFT MIDDLE FINGER 2+V  COMPARISON:  None.  FINDINGS: There is moderate dorsal soft tissue swelling over the PIP joint of the left middle finger. The joint spaces are maintained. No findings for septic arthritis, osteomyelitis or radiopaque foreign body.  IMPRESSION: Soft tissue swelling but no significant bony findings.   Electronically Signed   By: Rudie Meyer M.D.   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    Meds given in ED:  Medications  lidocaine-EPINEPHrine (XYLOCAINE W/EPI) 1 %-1:100000 (with pres) injection 10 mL (10 mLs Other Given by Other 05/18/15 1933)    New Prescriptions   NAPROXEN (NAPROSYN) 500 MG TABLET    Take 1 tablet (500 mg total) by mouth 2 (two) times daily with a meal.   SULFAMETHOXAZOLE-TRIMETHOPRIM (BACTRIM DS,SEPTRA DS) 800-160 MG PER TABLET    Take 1 tablet by mouth 2 (two) times daily.      I personally performed the services described in this documentation, which was scribed in my presence. The recorded information has been reviewed and considered.   Eber Hong, MD 05/18/15 [phone]

## 2015-05-18 NOTE — ED Notes (Signed)
He cut himself on a piece of metal about 4 days ago and hes had increasing pain, swelling and redness to site since. Wound with blood and pus noted to R posterior middle finger with redness surrounding it.

## 2015-05-18 NOTE — Discharge Instructions (Signed)
Return to the ER in the next 2-3 days for a wound check.  Please obtain all of your results from medical records or have your doctors office obtain the results - share them with your doctor - you should be seen at your doctors office in the next 2 days. Call today to arrange your follow up. Take the medications as prescribed. Please review all of the medicines and only take them if you do not have an allergy to them. Please be aware that if you are taking birth control pills, taking other prescriptions, ESPECIALLY ANTIBIOTICS may make the birth control ineffective - if this is the case, either do not engage in sexual activity or use alternative methods of birth control such as condoms until you have finished the medicine and your family doctor says it is OK to restart them. If you are on a blood thinner such as COUMADIN, be aware that any other medicine that you take may cause the coumadin to either work too much, or not enough - you should have your coumadin level rechecked in next 7 days if this is the case.  ?  It is also a possibility that you have an allergic reaction to any of the medicines that you have been prescribed - Everybody reacts differently to medications and while MOST people have no trouble with most medicines, you may have a reaction such as nausea, vomiting, rash, swelling, shortness of breath. If this is the case, please stop taking the medicine immediately and contact your physician.  ?  You should return to the ER if you develop severe or worsening symptoms.

## 2015-08-04 ENCOUNTER — Emergency Department (HOSPITAL_COMMUNITY)
Admission: EM | Admit: 2015-08-04 | Discharge: 2015-08-04 | Disposition: A | Discharge status: Home / Self Care | Payer: Self-pay | Attending: Emergency Medicine | Admitting: Emergency Medicine

## 2015-08-04 ENCOUNTER — Encounter (HOSPITAL_COMMUNITY): Payer: Self-pay | Admitting: Adult Health

## 2015-08-04 DIAGNOSIS — J029 Acute pharyngitis, unspecified: Secondary | ICD-10-CM | POA: Insufficient documentation

## 2015-08-04 DIAGNOSIS — Z87891 Personal history of nicotine dependence: Secondary | ICD-10-CM | POA: Insufficient documentation

## 2015-08-04 DIAGNOSIS — Z792 Long term (current) use of antibiotics: Secondary | ICD-10-CM | POA: Insufficient documentation

## 2015-08-04 DIAGNOSIS — Z791 Long term (current) use of non-steroidal anti-inflammatories (NSAID): Secondary | ICD-10-CM | POA: Insufficient documentation

## 2015-08-04 LAB — RAPID STREP SCREEN (MED CTR MEBANE ONLY): Streptococcus, Group A Screen (Direct): NEGATIVE

## 2015-08-04 MED ORDER — IBUPROFEN 200 MG PO TABS
600.0000 mg | ORAL_TABLET | Freq: Once | ORAL | Status: AC
  Administered 2015-08-04: 600 mg via ORAL
  Filled 2015-08-04: qty 3

## 2015-08-04 NOTE — ED Notes (Signed)
Presents with sore throat began 2 days ago pain is a 10 with swallowing, no exudase noted, throat red.

## 2015-08-04 NOTE — ED Provider Notes (Signed)
CSN: [id number]     Arrival date & time 08/04/15  2151 History  By signing my name below, I, Gerald Oliver, attest that this documentation has been prepared under the direction and in the presence of Elpidio AnisShari Jaquay Posthumus, PA-C. Electronically Signed: Angelene GiovanniEmmanuella Oliver, ED Scribe. 08/04/2015. 11:00 PM.    Chief Complaint  Patient presents with  . Sore Throat   Patient is a 24 y.o. male presenting with pharyngitis. The history is provided by the patient. No language interpreter was used.  Sore Throat This is a new problem. The current episode started 2 days ago. The problem occurs rarely. The problem has been gradually worsening. Nothing aggravates the symptoms. Nothing relieves the symptoms. He has tried nothing for the symptoms.   HPI Comments: Gerald ShockMichael J Cerone is a 24 y.o. male who presents to the Emergency Department complaining of gradually worsening sore throat onset 2 days ago. He reports associated trouble swallowing and adds that he feels like he is producing more saliva causing him to have to spit it out. He denies any fever or rhinorrhea. No alleviating factors noted. He denies smoking.   Past Medical History  Diagnosis Date  . Medical history non-contributory    Past Surgical History  Procedure Laterality Date  . Wisdom tooth extraction     History reviewed. No pertinent family history. Social History  Substance Use Topics  . Smoking status: Former Smoker    Types: Cigarettes    Quit date: 05/14/2008  . Smokeless tobacco: Never Used  . Alcohol Use: No     Comment: rarely    Review of Systems  Constitutional: Negative for fever.  HENT: Positive for sore throat. Negative for congestion and rhinorrhea.       Allergies  Review of patient's allergies indicates no known allergies.  Home Medications   Prior to Admission medications   Medication Sig Start Date End Date Taking? Authorizing Provider  amoxicillin-clavulanate (AUGMENTIN) 875-125 MG per tablet Take 1 tablet  by mouth 2 (two) times daily. 05/16/14   Kela MillinAdeline C Viyuoh, MD  naproxen (NAPROSYN) 500 MG tablet Take 1 tablet (500 mg total) by mouth 2 (two) times daily with a meal. 05/18/15   Eber HongBrian Simonian, MD   BP 92/73 mmHg  Pulse 70  Temp(Src) 98.8 F (37.1 C) (Oral)  Resp 18  SpO2 96% Physical Exam  Constitutional: He is oriented to person, place, and time. He appears well-developed and well-nourished.  HENT:  Head: Normocephalic and atraumatic.  Oropharynx slightly erythematous, no swelling, no exudates Uvula midline.  No adenopathy   Cardiovascular: Normal rate.   Pulmonary/Chest: Effort normal and breath sounds normal.  Lungs are clear  Abdominal: He exhibits no distension.  Lymphadenopathy:    He has no cervical adenopathy.  Neurological: He is alert and oriented to person, place, and time.  Skin: Skin is warm and dry.  Psychiatric: He has a normal mood and affect.  Nursing note and vitals reviewed.   ED Course  Procedures (including critical care time) DIAGNOSTIC STUDIES: Oxygen Saturation is 96% on RA, adequate by my interpretation.    COORDINATION OF CARE: 10:51 PM- Pt advised of plan for treatment and pt agrees. Pt told his rapid Strep test was negative and advised to gargle with salt or use OTC throat spray.    Labs Review Labs Reviewed  RAPID STREP SCREEN (NOT [facility])  CULTURE, GROUP A STREP   Results for orders placed or performed during the hospital encounter of 08/04/15  Rapid strep screen (not  at [facility])  Result Value Ref Range   Streptococcus, Group A Screen (Direct) NEGATIVE NEGATIVE    Elpidio Anis, PA-C personally reviewed and evaluated these lab results as part of my medical decision-making.   EKG Interpretation None      MDM   Final diagnoses:  None    1. Pharyngitis  Well appearing patient with uncomplicated viral sore throat.   I personally performed the services described in this documentation, which was scribed in my presence. The recorded  information has been reviewed and is accurate.     Elpidio Anis, PA-C 08/05/15 [id number]  Pricilla Loveless, MD 08/05/15 [phone]

## 2015-08-04 NOTE — Discharge Instructions (Signed)

## 2015-08-07 LAB — CULTURE, GROUP A STREP: Strep A Culture: NEGATIVE

## 2015-12-24 ENCOUNTER — Ambulatory Visit (HOSPITAL_COMMUNITY)
Admission: AD | Admit: 2015-12-24 | Discharge: 2015-12-24 | Disposition: A | Discharge status: Home / Self Care | Payer: Self-pay | Attending: Psychiatry | Admitting: Psychiatry

## 2015-12-24 DIAGNOSIS — Z87891 Personal history of nicotine dependence: Secondary | ICD-10-CM | POA: Insufficient documentation

## 2015-12-24 DIAGNOSIS — F329 Major depressive disorder, single episode, unspecified: Secondary | ICD-10-CM | POA: Insufficient documentation

## 2015-12-24 NOTE — BH Assessment (Signed)
Tele Assessment Note   Gerald Oliver is an 25 y.o. male. According to the Pt, [facility]. The Pt states "she thinks I need to talk with someone." The Pt reports passive SI and HI. Pt denies a plan. Pt states he is homeless and "fed-up with life." Pt states he is upset about being homeless, not having a job, "beign broke," and not having a proper diet. The Pt is a vegetarian. According to the Pt, he wants resources to assist him in improving his life. Pt denies SA. Pt reports previous alcohol use. Pt denies abuse. Pt is currently on probation for cocaine possession. Pt states "I have 1 more year on probation." [facility]. Pt admits to not taking his prescribed depression medication. Pt states "I don't like the way it makes me feel."  Writer consulted with Renata Capriceonrad, DNP. Per Renata Capriceonrad, DNP Pt meets Obs criteria. Pt declined Obs. Per Renata Capriceonrad [facility] with [facility].   Diagnosis:  F32.9  Depressive Disorder  Past Medical History:  Past Medical History  Diagnosis Date  . Medical history non-contributory     Past Surgical History  Procedure Laterality Date  . Wisdom tooth extraction      Family History: No family history on file.  Social History:  reports that he quit smoking about 7 years ago. His smoking use included Cigarettes. He has never used smokeless tobacco. He reports that he does not drink alcohol or use illicit drugs.  Additional Social History:  Alcohol / Drug Use Pain Medications: Pt denies  Prescriptions: Seroquel Over the Counter: Pt denies History of alcohol / drug use?: Yes Longest period of sobriety (when/how long): NA Substance #1 Name of Substance 1: alcohol 1 - Age of First Use: unknown 1 - Amount (size/oz): unknown 1 - Frequency: unknown 1 - Duration: unknown 1 - Last Use / Amount: unknown  CIWA:   COWS:    PATIENT STRENGTHS: (choose at least two) Average or above  average intelligence Communication skills  Allergies: No Known Allergies  Home Medications:  (Not in a hospital admission)  OB/GYN Status:  No LMP for male patient.  General Assessment Data [facility] Assessment: In system Is this a Tele or Face-to-Face Assessment?: Face-to-Face Is this an Initial Assessment or a Re-assessment for this encounter?: Initial Assessment Marital status: Single Maiden name: NA Is patient pregnant?: No Pregnancy Status: No Living Arrangements: Other (Comment) (homeless) Can pt return to current living arrangement?: Yes Admission Status: Voluntary Is patient capable of signing voluntary admission?: Yes Referral Source: Self/Family/Friend Insurance type: SP     Crisis Care Plan Living Arrangements: Other (Comment) (homeless) Legal Guardian: Other: (self) [facility] Name of Therapist: NA  Education Status Is patient currently in school?: No Current Grade: NA Highest grade of school patient has completed: GED Name of school: NA Contact person: NA  Risk to self with the past 6 months Suicidal Ideation: No-Not Currently/Within Last 6 Months Has patient been a risk to self within the past 6 months prior to admission? : No Suicidal Intent: No Has patient had any suicidal intent within the past 6 months prior to admission? : No Is patient at risk for suicide?: No Suicidal Plan?: No Has patient had any suicidal plan within the past 6 months prior to admission? : No Access to Means: No What has been your use of drugs/alcohol  within the last 12 months?: past alcohol use Previous Attempts/Gestures: No How many times?: 0 Other Self Harm Risks: cutting Triggers for Past Attempts: None known Intentional Self Injurious Behavior: Cutting Comment - Self Injurious Behavior: cutting Family Suicide History: No Recent stressful life event(s): Job Loss, Other (Comment) (homelessness) Persecutory  voices/beliefs?: No Depression: Yes Depression Symptoms: Feeling angry/irritable, Feeling worthless/self pity Substance abuse history and/or treatment for substance abuse?: No Suicide prevention information given to non-admitted patients: Not applicable  Risk to Others within the past 6 months Homicidal Ideation: No Does patient have any lifetime risk of violence toward others beyond the six months prior to admission? : No Thoughts of Harm to Others: No Current Homicidal Intent: No Current Homicidal Plan: No Access to Homicidal Means: No Identified Victim: NA History of harm to others?: No Assessment of Violence: None Noted Violent Behavior Description: NA Does patient have access to weapons?: No Criminal Charges Pending?: No Does patient have a court date: No Is patient on probation?: Yes  Psychosis Hallucinations: None noted Delusions: None noted  Mental Status Report Appearance/Hygiene: Disheveled Eye Contact: Fair Motor Activity: Freedom of movement Speech: Logical/coherent Level of Consciousness: Alert Mood: Euthymic Affect: Appropriate to circumstance Anxiety Level: Minimal Thought Processes: Coherent, Relevant Judgement: Unimpaired Orientation: Person, Place, Time, Situation, Appropriate for developmental age Obsessive Compulsive Thoughts/Behaviors: None  Cognitive Functioning Concentration: Normal Memory: Recent Intact, Remote Intact IQ: Average Insight: Fair Impulse Control: Fair Appetite: Poor Weight Loss: 10 Weight Gain: 0 Sleep: Decreased Total Hours of Sleep: 5 Vegetative Symptoms: None  ADLScreening [facility] Assessment Services) Patient's cognitive ability adequate to safely complete daily activities?: Yes Patient able to express need for assistance with ADLs?: Yes Independently performs ADLs?: Yes (appropriate for developmental age)  Prior Inpatient Therapy Prior Inpatient Therapy: No Prior Therapy Dates: NA Prior Therapy Facilty/Provider(s):  NA Reason for Treatment: NA  Prior Outpatient Therapy Prior Outpatient Therapy: Yes Prior Therapy Dates: 2017 Prior Therapy Facilty/Provider(s): [facility] Reason for Treatment: depression Does patient have an ACCT team?: No Does patient have Intensive In-House Services?  : No Does patient have [facility] services? : No Does patient have P4CC services?: No  ADL Screening (condition at time of admission) Patient's cognitive ability adequate to safely complete daily activities?: Yes Is the patient deaf or have difficulty hearing?: No Does the patient have difficulty seeing, even when wearing glasses/contacts?: No Does the patient have difficulty concentrating, remembering, or making decisions?: No Patient able to express need for assistance with ADLs?: Yes Does the patient have difficulty dressing or bathing?: No Independently performs ADLs?: Yes (appropriate for developmental age) Does the patient have difficulty walking or climbing stairs?: No Weakness of Legs: None Weakness of Arms/Hands: None       Abuse/Neglect Assessment (Assessment to be complete while patient is alone) Physical Abuse: Denies Verbal Abuse: Denies Sexual Abuse: Denies Exploitation of patient/patient's resources: Denies Self-Neglect: Denies Values / Beliefs Cultural Requests During Hospitalization: None Spiritual Requests During Hospitalization: None        Additional Information 1:1 In Past 12 Months?: No CIRT Risk: No Elopement Risk: No Does patient have medical clearance?: No     Disposition:  Disposition Initial Assessment Completed for this Encounter: Yes Disposition of Patient: Other dispositions (Obs) Other disposition(s): Other (Comment) (Obs)  Jaclynn Laumann D 12/24/2015 10:05 AM

## 2017-09-23 ENCOUNTER — Inpatient Hospital Stay (HOSPITAL_COMMUNITY)
Admission: EM | Admit: 2017-09-23 | Discharge: 2017-09-23 | DRG: 917 | Discharge status: Against Medical Advice | Payer: Self-pay | Attending: Internal Medicine | Admitting: Internal Medicine

## 2017-09-23 ENCOUNTER — Emergency Department (HOSPITAL_COMMUNITY): Payer: Self-pay

## 2017-09-23 DIAGNOSIS — J690 Pneumonitis due to inhalation of food and vomit: Secondary | ICD-10-CM | POA: Diagnosis present

## 2017-09-23 DIAGNOSIS — E872 Acidosis: Secondary | ICD-10-CM | POA: Diagnosis present

## 2017-09-23 DIAGNOSIS — Z791 Long term (current) use of non-steroidal anti-inflammatories (NSAID): Secondary | ICD-10-CM

## 2017-09-23 DIAGNOSIS — A419 Sepsis, unspecified organism: Secondary | ICD-10-CM

## 2017-09-23 DIAGNOSIS — R739 Hyperglycemia, unspecified: Secondary | ICD-10-CM | POA: Diagnosis present

## 2017-09-23 DIAGNOSIS — T401X1A Poisoning by heroin, accidental (unintentional), initial encounter: Principal | ICD-10-CM | POA: Diagnosis present

## 2017-09-23 DIAGNOSIS — J9601 Acute respiratory failure with hypoxia: Secondary | ICD-10-CM | POA: Diagnosis present

## 2017-09-23 DIAGNOSIS — T50901A Poisoning by unspecified drugs, medicaments and biological substances, accidental (unintentional), initial encounter: Secondary | ICD-10-CM

## 2017-09-23 DIAGNOSIS — Z87891 Personal history of nicotine dependence: Secondary | ICD-10-CM

## 2017-09-23 DIAGNOSIS — F1110 Opioid abuse, uncomplicated: Secondary | ICD-10-CM | POA: Diagnosis present

## 2017-09-23 LAB — COMPREHENSIVE METABOLIC PANEL
ALK PHOS: 82 U/L (ref 38–126)
ALT: 29 U/L (ref 17–63)
AST: 50 U/L — AB (ref 15–41)
Albumin: 4.9 g/dL (ref 3.5–5.0)
Anion gap: 18 — ABNORMAL HIGH (ref 5–15)
BUN: 11 mg/dL (ref 6–20)
CALCIUM: 9.2 mg/dL (ref 8.9–10.3)
CHLORIDE: 101 mmol/L (ref 101–111)
CO2: 21 mmol/L — AB (ref 22–32)
CREATININE: 1.61 mg/dL — AB (ref 0.61–1.24)
GFR calc Af Amer: >60 mL/min (ref >60)
GFR calc non Af Amer: 57 mL/min — ABNORMAL LOW (ref >60)
Glucose, Bld: 254 mg/dL — ABNORMAL HIGH (ref 65–99)
Potassium: 4.4 mmol/L (ref 3.5–5.1)
SODIUM: 140 mmol/L (ref 135–145)
Total Bilirubin: 1.5 mg/dL — ABNORMAL HIGH (ref 0.3–1.2)
Total Protein: 8.5 g/dL — ABNORMAL HIGH (ref 6.5–8.1)

## 2017-09-23 LAB — CBC WITH DIFFERENTIAL/PLATELET
BASOS PCT: 0 %
Basophils Absolute: 0 10*3/uL (ref 0.0–0.1)
EOS PCT: 1 %
Eosinophils Absolute: 0.1 10*3/uL (ref 0.0–0.7)
HCT: 52.7 % — ABNORMAL HIGH (ref 39.0–52.0)
Hemoglobin: 18.4 g/dL — ABNORMAL HIGH (ref 13.0–17.0)
LYMPHS ABS: 2.9 10*3/uL (ref 0.7–4.0)
Lymphocytes Relative: 23 %
MCH: 30.9 pg (ref 26.0–34.0)
MCHC: 34.9 g/dL (ref 30.0–36.0)
MCV: 88.4 fL (ref 78.0–100.0)
MONO ABS: 0.1 10*3/uL (ref 0.1–1.0)
Monocytes Relative: 1 %
NEUTROS ABS: 9.4 10*3/uL — AB (ref 1.7–7.7)
Neutrophils Relative %: 75 %
PLATELETS: 242 10*3/uL (ref 150–400)
RBC: 5.96 MIL/uL — ABNORMAL HIGH (ref 4.22–5.81)
RDW: 13.4 % (ref 11.5–15.5)
WBC: 12.5 10*3/uL — ABNORMAL HIGH (ref 4.0–10.5)

## 2017-09-23 LAB — CBG MONITORING, ED: Glucose-Capillary: 209 mg/dL — ABNORMAL HIGH (ref 65–99)

## 2017-09-23 LAB — I-STAT CG4 LACTIC ACID, ED
LACTIC ACID, VENOUS: 4.4 mmol/L — AB (ref 0.5–1.9)
Lactic Acid, Venous: 4.84 mmol/L (ref 0.5–1.9)

## 2017-09-23 LAB — BLOOD GAS, ARTERIAL
ACID-BASE DEFICIT: 4.8 mmol/L — AB (ref 0.0–2.0)
BICARBONATE: 22.3 mmol/L (ref 20.0–28.0)
Drawn by: 270211
FIO2: 0.21
O2 SAT: 65.6 %
PATIENT TEMPERATURE: 96
PH ART: 7.297 — AB (ref 7.350–7.450)
pCO2 arterial: 46.2 mmHg (ref 32.0–48.0)
pO2, Arterial: 34.3 mmHg — CL (ref 83.0–108.0)

## 2017-09-23 LAB — INFLUENZA PANEL BY PCR (TYPE A & B)
INFLAPCR: NEGATIVE
INFLBPCR: NEGATIVE

## 2017-09-23 MED ORDER — PIPERACILLIN-TAZOBACTAM 3.375 G IVPB: 3.3750 g | Freq: Three times a day (TID) | INTRAVENOUS | Status: DC

## 2017-09-23 MED ORDER — SODIUM CHLORIDE 0.9 % IV BOLUS (SEPSIS)
2500.0000 mL | Freq: Once | INTRAVENOUS | Status: AC
  Administered 2017-09-23: 2500 mL via INTRAVENOUS

## 2017-09-23 MED ORDER — DEXTROSE 5 % IV SOLN: 500.0000 mg | Freq: Once | INTRAVENOUS | Status: DC

## 2017-09-23 MED ORDER — NALOXONE HCL 0.4 MG/ML IJ SOLN
INTRAMUSCULAR | Status: AC
  Filled 2017-09-23: qty 1

## 2017-09-23 MED ORDER — KETOROLAC TROMETHAMINE 30 MG/ML IJ SOLN: 30.0000 mg | Freq: Four times a day (QID) | INTRAMUSCULAR | Status: DC | PRN

## 2017-09-23 MED ORDER — PIPERACILLIN-TAZOBACTAM 3.375 G IVPB 30 MIN
3.3750 g | INTRAVENOUS | Status: AC
  Administered 2017-09-23: 3.375 g via INTRAVENOUS
  Filled 2017-09-23: qty 50

## 2017-09-23 MED ORDER — ACETAMINOPHEN 650 MG RE SUPP: 650.0000 mg | Freq: Four times a day (QID) | RECTAL | Status: DC | PRN

## 2017-09-23 MED ORDER — ACETAMINOPHEN 325 MG PO TABS: 650.0000 mg | ORAL_TABLET | Freq: Four times a day (QID) | ORAL | Status: DC | PRN

## 2017-09-23 MED ORDER — DEXTROSE 5 % IV SOLN: 1.0000 g | Freq: Once | INTRAVENOUS | Status: DC

## 2017-09-23 MED ORDER — SODIUM CHLORIDE 0.9 % IV SOLN: INTRAVENOUS | Status: DC

## 2017-09-23 MED ORDER — HEPARIN SODIUM (PORCINE) 5000 UNIT/ML IJ SOLN: 5000.0000 [IU] | Freq: Three times a day (TID) | INTRAMUSCULAR | Status: DC
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
## 2017-09-23 NOTE — Progress Notes (Signed)
Pharmacy Antibiotic Note  Gerald Oliver is a 26 y.o. male admitted on 09/23/2017 with probable aspiration PNA. to ED 12/8 by EMS. Comatose after Heroin/Fentanyl injection, CPR performed by girlfriend who called 911, Narcan given intranasally.    Pharmacy has been consulted for Zosyn dosing.  Plan: Zosyn 3.375g IV q8h (4 hour infusion).    Temp (24hrs), Avg:97.3 F (36.3 C), Min:96 F (35.6 C), Max:98.6 F (37 C)  Recent Labs  Lab 09/23/17 1158 09/23/17 1346 09/23/17 1521  WBC 12.5*  --   --   CREATININE 1.61*  --   --   LATICACIDVEN  --  4.84* 4.40*    CrCl cannot be calculated (Unknown ideal weight.).    No Known Allergies  Antimicrobials this admission: 12/8 Zosyn >>   Microbiology results: 12/8 BCx: sent 12/8 Flu panel PCR: sent 12/8 HIV Ab: sent  Thank you for allowing pharmacy to be a part of this patient's care. Pharmacy will sign off monitoring. Will adjust dose/schedule if needed  Otho BellowsGreen, Joee Iovine L 09/23/2017 3:25 PM

## 2017-09-23 NOTE — H&P (Signed)
History and Physical    Gerald ShockMichael J Uram WUJ:[id number] DOB: 1991-04-27 DOA: 09/23/2017  PCP: Default, Provider, MD  Patient coming from: Home  Chief Complaint: Heroin over overdose  HPI: Gerald Oliver is a 26 y.o. male with medical history significant of heroin abuse presents to ED after witnessed heroin and possibly fentanyl overdose. Pt lethargic, unable to provide hisotry. No family present. From documentation which was reviewed, patient was observed injecting heroing with possible fentanyl approximately one hr prior to ED visit. Patient became unresponsive, prompting patient's girlfriend to initiate CPR. En route to ED, patient did receive narcan with some improvement in mentation.   ED Course: In the ED, patient noted to be hypoxemic on room air with O2 sats in the 80's. Patient noted to have elevated lactate of 4.4. CXR obtained with findings of bilateral brochopneumonia, most severe in RUL. Hospitalist service consulted for considderation for admission.  Review of Systems:  Review of Systems  Unable to perform ROS: Mental acuity    Past Medical History:  Diagnosis Date  . Medical history non-contributory     Past Surgical History:  Procedure Laterality Date  . WISDOM TOOTH EXTRACTION       reports that he quit smoking about 9 years ago. His smoking use included cigarettes. he has never used smokeless tobacco. He reports that he does not drink alcohol or use drugs.  No Known Allergies  No family history on file.  Unable to obtain from pt given current mental acuity  Prior to Admission medications   Medication Sig Start Date End Date Taking? Authorizing Provider  amoxicillin-clavulanate (AUGMENTIN) 875-125 MG per tablet Take 1 tablet by mouth 2 (two) times daily. 05/16/14   Viyuoh, Rolland BimlerAdeline C, MD  naproxen (NAPROSYN) 500 MG tablet Take 1 tablet (500 mg total) by mouth 2 (two) times daily with a meal. 05/18/15   Eber HongMiller, Brian, MD    Physical Exam: Vitals:   09/23/17  1330 09/23/17 1345 09/23/17 1401 09/23/17 1404  BP: (!) 131/100 (!) 127/99 129/83   Pulse:      Resp: (!) 22 (!) 23 (!) 32   Temp:    98.6 F (37 C)  TempSrc:    Oral  SpO2:        Constitutional: NAD, calm, comfortable Vitals:   09/23/17 1330 09/23/17 1345 09/23/17 1401 09/23/17 1404  BP: (!) 131/100 (!) 127/99 129/83   Pulse:      Resp: (!) 22 (!) 23 (!) 32   Temp:    98.6 F (37 C)  TempSrc:    Oral  SpO2:       Eyes: PERRL, lids and conjunctivae normal ENMT: Mucous membranes are moist. Posterior pharynx clear of any exudate or lesions.Normal dentition.  Neck: normal, supple, no masses, no thyromegaly Respiratory: clear to auscultation bilaterally, no wheezing, no crackles. Normal respiratory effort. No accessory muscle use.  Cardiovascular: Regular rate and rhythm, no murmurs / rubs / gallops. No extremity edema. 2+ pedal pulses. No carotid bruits.  Abdomen: no tenderness, no masses palpated. No hepatosplenomegaly. Bowel sounds positive.  Musculoskeletal: no clubbing / cyanosis. No joint deformity upper and lower extremities. Good ROM, no contractures. Normal muscle tone.  Skin: no rashes, lesions, ulcers. No induration Neurologic: CN 2-12 grossly intact. Sensation intact,  Difficult to fully assess given mental status Psychiatric: appears lethargic, arousable, difficult to assess    Labs on Admission: I have personally reviewed following labs and imaging studies  CBC: Recent Labs  Lab 09/23/17 1158  WBC  12.5*  NEUTROABS 9.4*  HGB 18.4*  HCT 52.7*  MCV 88.4  PLT 242   Basic Metabolic Panel: Recent Labs  Lab 09/23/17 1158  NA 140  K 4.4  CL 101  CO2 21*  GLUCOSE 254*  BUN 11  CREATININE 1.61*  CALCIUM 9.2   GFR: CrCl cannot be calculated (Unknown ideal weight.). Liver Function Tests: Recent Labs  Lab 09/23/17 1158  AST 50*  ALT 29  ALKPHOS 82  BILITOT 1.5*  PROT 8.5*  ALBUMIN 4.9   No results for input(s): LIPASE, AMYLASE in the last 168  hours. No results for input(s): AMMONIA in the last 168 hours. Coagulation Profile: No results for input(s): INR, PROTIME in the last 168 hours. Cardiac Enzymes: No results for input(s): CKTOTAL, CKMB, CKMBINDEX, TROPONINI in the last 168 hours. BNP (last 3 results) No results for input(s): PROBNP in the last 8760 hours. HbA1C: No results for input(s): HGBA1C in the last 72 hours. CBG: Recent Labs  Lab 09/23/17 1206  GLUCAP 209*   Lipid Profile: No results for input(s): CHOL, HDL, LDLCALC, TRIG, CHOLHDL, LDLDIRECT in the last 72 hours. Thyroid Function Tests: No results for input(s): TSH, T4TOTAL, FREET4, T3FREE, THYROIDAB in the last 72 hours. Anemia Panel: No results for input(s): VITAMINB12, FOLATE, FERRITIN, TIBC, IRON, RETICCTPCT in the last 72 hours. Urine analysis:    Component Value Date/Time   COLORURINE YELLOW 05/14/2014 2035   APPEARANCEUR CLOUDY (A) 05/14/2014 2035   LABSPEC 1.022 05/14/2014 2035   PHURINE 5.0 05/14/2014 2035   GLUCOSEU NEGATIVE 05/14/2014 2035   HGBUR TRACE (A) 05/14/2014 2035   BILIRUBINUR NEGATIVE 05/14/2014 2035   KETONESUR NEGATIVE 05/14/2014 2035   PROTEINUR 100 (A) 05/14/2014 2035   UROBILINOGEN 0.2 05/14/2014 2035   NITRITE NEGATIVE 05/14/2014 2035   LEUKOCYTESUR NEGATIVE 05/14/2014 2035   Sepsis Labs: !!!!!!!!!!!!!!!!!!!!!!!!!!!!!!!!!!!!!!!!!!!! @LABRCNTIP (procalcitonin:4,lacticidven:4) )No results found for this or any previous visit (from the past 240 hour(s)).   Radiological Exams on Admission: Dg Chest Port 1 View  Result Date: 09/23/2017 CLINICAL DATA:  Cough EXAM: PORTABLE CHEST 1 VIEW COMPARISON:  05/14/2014 FINDINGS: Heart size is normal. There is bilateral bronchopneumonia most extensive in the right upper lobe. No effusion. No acute bone finding. IMPRESSION: Bilateral bronchopneumonia, most severe in the right upper lobe. Electronically Signed   By: Paulina FusiMark  Shogry M.D.   On: 09/23/2017 12:47    EKG: Independently reviewed.  Sinus tach  Assessment/Plan Principal Problem:   Aspiration pneumonia (HCC) Active Problems:   Heroin abuse (HCC)   Acute hypoxemic respiratory failure (HCC)   1. Aspiration PNA 1. Findings on PNA noted on CXR 2. Concerns of aspiration while unresponsive following below overdose 3. Will continue patient on zosyn 4. Wean O2 as tolerated 2. Lactic acidosis 1. Lactate of over 4 noted 2. Suspect related to presenting hypoxemia 3. Trend lactate levels 4. Monitor closely in SDU 5. Continue IVF hydration 3. Heroin abuse/overdose 1. Narcan given in the field 2. Mental status seems somewhat improved 3. Avoid opiates/sedatives  DVT prophylaxis: Heparin subQ  Code Status: Full Family Communication: Pt in room  Disposition Plan: Uncertain at this time  Consults called:  Admission status: Inpatient as would likely require greater than 2 midnight stay for IV abx for pna   Rickey BarbaraStephen Chiu MD [facility] Pager [phone]  If 7PM-7AM, please contact night-coverage [email or web address] Password [email or web address]  09/23/2017, 2:50 PM

## 2017-09-23 NOTE — ED Notes (Signed)
He remains in no distress. He is breathing normally and maintains a consistent ETCO of ~35. His extremities are very cool, making the spo2 wave form suspect; however, it averages 92-94% on 4 l.p.m.
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
## 2017-09-23 NOTE — ED Notes (Signed)
He is coherent whenever he is aroused. He has asked for water x 2, and has drank it without problem.
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
## 2017-09-28 LAB — CULTURE, BLOOD (ROUTINE X 2)
CULTURE: NO GROWTH
Culture: NO GROWTH
SPECIAL REQUESTS: ADEQUATE
SPECIAL REQUESTS: ADEQUATE

## 2018-04-02 ENCOUNTER — Emergency Department (HOSPITAL_COMMUNITY): Payer: Self-pay

## 2018-04-02 ENCOUNTER — Emergency Department (HOSPITAL_COMMUNITY)
Admission: EM | Admit: 2018-04-02 | Discharge: 2018-04-02 | Discharge status: Against Medical Advice | Payer: Self-pay | Attending: Emergency Medicine | Admitting: Emergency Medicine

## 2018-04-02 ENCOUNTER — Other Ambulatory Visit: Payer: Self-pay

## 2018-04-02 DIAGNOSIS — T40604A Poisoning by unspecified narcotics, undetermined, initial encounter: Secondary | ICD-10-CM | POA: Insufficient documentation

## 2018-04-02 DIAGNOSIS — Z79899 Other long term (current) drug therapy: Secondary | ICD-10-CM | POA: Insufficient documentation

## 2018-04-02 DIAGNOSIS — Z87891 Personal history of nicotine dependence: Secondary | ICD-10-CM | POA: Insufficient documentation

## 2018-04-02 DIAGNOSIS — F1110 Opioid abuse, uncomplicated: Secondary | ICD-10-CM | POA: Insufficient documentation

## 2018-04-02 LAB — COMPREHENSIVE METABOLIC PANEL
ALBUMIN: 4.3 g/dL (ref 3.5–5.0)
ALK PHOS: 64 U/L (ref 38–126)
ALT: 40 U/L (ref 17–63)
ANION GAP: 8 (ref 5–15)
AST: 33 U/L (ref 15–41)
BUN: 10 mg/dL (ref 6–20)
CALCIUM: 8.9 mg/dL (ref 8.9–10.3)
CHLORIDE: 106 mmol/L (ref 101–111)
CO2: 27 mmol/L (ref 22–32)
Creatinine, Ser: 1.1 mg/dL (ref 0.61–1.24)
GFR calc non Af Amer: >60 mL/min (ref >60)
GLUCOSE: 125 mg/dL — AB (ref 65–99)
Potassium: 4.2 mmol/L (ref 3.5–5.1)
Sodium: 141 mmol/L (ref 135–145)
Total Bilirubin: 2 mg/dL — ABNORMAL HIGH (ref 0.3–1.2)
Total Protein: 7.3 g/dL (ref 6.5–8.1)

## 2018-04-02 LAB — CBC WITH DIFFERENTIAL/PLATELET
BASOS PCT: 0 %
Basophils Absolute: 0 10*3/uL (ref 0.0–0.1)
EOS ABS: 0 10*3/uL (ref 0.0–0.7)
Eosinophils Relative: 0 %
HEMATOCRIT: 43.4 % (ref 39.0–52.0)
HEMOGLOBIN: 15.1 g/dL (ref 13.0–17.0)
Lymphocytes Relative: 10 %
Lymphs Abs: 0.9 10*3/uL (ref 0.7–4.0)
MCH: 29.4 pg (ref 26.0–34.0)
MCHC: 34.8 g/dL (ref 30.0–36.0)
MCV: 84.6 fL (ref 78.0–100.0)
MONOS PCT: 11 %
Monocytes Absolute: 0.9 10*3/uL (ref 0.1–1.0)
NEUTROS ABS: 6.5 10*3/uL (ref 1.7–7.7)
NEUTROS PCT: 79 %
Platelets: 177 10*3/uL (ref 150–400)
RBC: 5.13 MIL/uL (ref 4.22–5.81)
RDW: 13.2 % (ref 11.5–15.5)
WBC: 8.3 10*3/uL (ref 4.0–10.5)

## 2018-04-02 LAB — SALICYLATE LEVEL: Salicylate Lvl: <7 mg/dL (ref 2.8–30.0)

## 2018-04-02 LAB — ACETAMINOPHEN LEVEL

## 2018-04-02 LAB — ETHANOL: Alcohol, Ethyl (B): <10 mg/dL (ref <10)

## 2018-04-02 MED ORDER — TETANUS-DIPHTH-ACELL PERTUSSIS 5-2.5-18.5 LF-MCG/0.5 IM SUSP: 0.5000 mL | Freq: Once | INTRAMUSCULAR | Status: DC

## 2018-04-02 NOTE — ED Notes (Signed)
Pt requesting to speak to Dr.Rancour prior to having radiology testing done. Dr.Rancour notified.
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
## 2018-04-02 NOTE — ED Notes (Signed)
Bed: RESB Expected date:  Expected time:  Means of arrival:  Comments: 27 yo M/OD

## 2018-04-02 NOTE — ED Provider Notes (Signed)
[facility]-EMERGENCY DEPT Provider Note   CSN: [id number] Arrival date & time: 04/02/18  0234     History   Chief Complaint Chief Complaint  Patient presents with  . Drug Overdose    HPI Gerald Oliver is a 27 y.o. male.  Patient presents to the ED after found unresponsive by bystanders.  Patient did receive bag valve mask breathing and 1.5 mill grams of Narcan by EMS.  He is now awake and alert.  He admits to snorting heroin earlier today but does not know how much.  States he does not use on a regular basis.  Denies any IV drug abuse.   denies any other ingestions but did have one Avayamargarita tonight.  He is uncertain what happened but he has abrasions to his bilateral knees and left shoulder.  He complains of headache and pain to his right knee.  He denies any chest pain or shortness of breath.  Denies any suicidal homicidal thoughts or hallucinations.  The history is provided by the patient and the EMS personnel.  Drug Overdose  Associated symptoms include headaches. Pertinent negatives include no chest pain, no abdominal pain and no shortness of breath.    Past Medical History:  Diagnosis Date  . Medical history non-contributory     Patient Active Problem List   Diagnosis Date Noted  . Acute hypoxemic respiratory failure (HCC) 09/23/2017  . Heroin overdose (HCC) 09/23/2017  . Drug overdose 05/14/2014  . Heroin abuse (HCC) 05/14/2014  . Aspiration pneumonia (HCC) 05/14/2014  . AKI (acute kidney injury) (HCC) 05/14/2014  . Unresponsive 05/14/2014    Past Surgical History:  Procedure Laterality Date  . WISDOM TOOTH EXTRACTION          Home Medications    Prior to Admission medications   Medication Sig Start Date End Date Taking? Authorizing Provider  amoxicillin-clavulanate (AUGMENTIN) 875-125 MG per tablet Take 1 tablet by mouth 2 (two) times daily. 05/16/14   Viyuoh, Rolland BimlerAdeline C, MD  naproxen (NAPROSYN) 500 MG tablet Take 1 tablet (500 mg  total) by mouth 2 (two) times daily with a meal. 05/18/15   Eber HongMiller, Brian, MD    Family History No family history on file.  Social History Social History   Tobacco Use  . Smoking status: Former Smoker    Types: Cigarettes    Last attempt to quit: 05/14/2008    Years since quitting: 9.8  . Smokeless tobacco: Never Used  Substance Use Topics  . Alcohol use: No    Comment: rarely  . Drug use: No    Comment: heroin     Allergies   Patient has no known allergies.   Review of Systems Review of Systems  Constitutional: Negative for activity change, appetite change and fever.  Respiratory: Negative for cough, chest tightness and shortness of breath.   Cardiovascular: Negative for chest pain.  Gastrointestinal: Negative for abdominal pain, nausea and vomiting.  Genitourinary: Negative for dysuria and hematuria.  Musculoskeletal: Positive for arthralgias and myalgias. Negative for back pain.  Skin: Positive for wound.  Neurological: Positive for syncope and headaches. Negative for dizziness and weakness.   all other systems are negative except as noted in the HPI and PMH.     Physical Exam Updated Vital Signs Pulse 94   Temp 98.4 F (36.9 C) (Oral)   SpO2 95%   Physical Exam  Constitutional: He is oriented to person, place, and time. He appears well-developed and well-nourished. No distress.  HENT:  Head:  Normocephalic and atraumatic.  Mouth/Throat: Oropharynx is clear and moist. No oropharyngeal exudate.  Eyes: Pupils are equal, round, and reactive to light. Conjunctivae and EOM are normal.  Neck: Normal range of motion. Neck supple.  No C-spine tenderness  Cardiovascular: Normal rate, regular rhythm, normal heart sounds and intact distal pulses.  No murmur heard. Pulmonary/Chest: Effort normal and breath sounds normal. No respiratory distress. He exhibits no tenderness.  Abdominal: Soft. There is no tenderness. There is no rebound and no guarding.  Musculoskeletal:  Normal range of motion. He exhibits no edema or tenderness.  Abrasions to bilateral knees without bony tenderness, abrasion to posterior left shoulder  Neurological: He is alert and oriented to person, place, and time. No cranial nerve deficit. He exhibits normal muscle tone. Coordination normal.  No ataxia on finger to nose bilaterally. No pronator drift. 5/5 strength throughout. CN 2-12 intact.Equal grip strength. Sensation intact.   Skin: Skin is warm. Capillary refill takes less than 2 seconds. No rash noted.  Psychiatric: He has a normal mood and affect. His behavior is normal.  Nursing note and vitals reviewed.    ED Treatments / Results  Labs (all labs ordered are listed, but only abnormal results are displayed) Labs Reviewed  COMPREHENSIVE METABOLIC PANEL - Abnormal; Notable for the following components:      Result Value   Glucose, Bld 125 (*)    Total Bilirubin 2.0 (*)    All other components within normal limits  ACETAMINOPHEN LEVEL - Abnormal; Notable for the following components:   Acetaminophen (Tylenol), Serum <10 (*)    All other components within normal limits  ETHANOL  SALICYLATE LEVEL  CBC WITH DIFFERENTIAL/PLATELET  CBC WITH DIFFERENTIAL/PLATELET    EKG None  Radiology No results found.  Procedures Procedures (including critical care time)  Medications Ordered in ED Medications  Tdap (BOOSTRIX) injection 0.5 mL (has no administration in time range)     Initial Impression / Assessment and Plan / ED Course  I have reviewed the triage vital signs and the nursing notes.  Pertinent labs & imaging results that were available during my care of the patient were reviewed by me and considered in my medical decision making (see chart for details).    Patient with suspected heroin overdose, now awake and alert after Narcan.  Injuries to shoulder and knees. Denies suicidal ideation.  Vitals stable. No hypoxia or increased work of breathing.  Will obtain  labs. Patient refuses tetanus vaccine. Refuses CXR, Knee xray , CT head.  Labs reassuring. Patient observed in ED for >3 hours without deterioration or need for repeat narcan dosing. He continues to refuse CXR and CT head. He is tolerating PO and ambulatory. He is requesting to leave.  He will leave against medical advice without further workup and appears to have capacity to make that decision. Final Clinical Impressions(s) / ED Diagnoses   Final diagnoses:  Opiate overdose, undetermined intent, initial encounter [facility])    ED Discharge Orders    None       Glynn Octave, MD 04/02/18 1925

## 2018-04-02 NOTE — Discharge Instructions (Signed)
You are leaving AGAINST MEDICAL ADVICE.  Stop using drugs.  Follow-up with your primary doctor.  Return to the ED if you develop new or worsening symptoms.

## 2018-05-06 ENCOUNTER — Other Ambulatory Visit: Payer: Self-pay

## 2018-05-06 ENCOUNTER — Emergency Department (HOSPITAL_COMMUNITY): Payer: Self-pay

## 2018-05-06 ENCOUNTER — Inpatient Hospital Stay (HOSPITAL_COMMUNITY)
Admission: EM | Admit: 2018-05-06 | Discharge: 2018-05-09 | DRG: 871 | Disposition: A | Discharge status: Home / Self Care | Payer: Self-pay | Attending: Internal Medicine | Admitting: Internal Medicine

## 2018-05-06 ENCOUNTER — Encounter (HOSPITAL_COMMUNITY): Payer: Self-pay | Admitting: *Deleted

## 2018-05-06 DIAGNOSIS — T50904A Poisoning by unspecified drugs, medicaments and biological substances, undetermined, initial encounter: Secondary | ICD-10-CM

## 2018-05-06 DIAGNOSIS — K828 Other specified diseases of gallbladder: Secondary | ICD-10-CM | POA: Diagnosis present

## 2018-05-06 DIAGNOSIS — T402X1A Poisoning by other opioids, accidental (unintentional), initial encounter: Secondary | ICD-10-CM | POA: Diagnosis present

## 2018-05-06 DIAGNOSIS — G92 Toxic encephalopathy: Secondary | ICD-10-CM | POA: Diagnosis present

## 2018-05-06 DIAGNOSIS — Z87891 Personal history of nicotine dependence: Secondary | ICD-10-CM

## 2018-05-06 DIAGNOSIS — Z791 Long term (current) use of non-steroidal anti-inflammatories (NSAID): Secondary | ICD-10-CM

## 2018-05-06 DIAGNOSIS — R6521 Severe sepsis with septic shock: Secondary | ICD-10-CM | POA: Diagnosis present

## 2018-05-06 DIAGNOSIS — E872 Acidosis: Secondary | ICD-10-CM | POA: Diagnosis present

## 2018-05-06 DIAGNOSIS — K819 Cholecystitis, unspecified: Secondary | ICD-10-CM

## 2018-05-06 DIAGNOSIS — R739 Hyperglycemia, unspecified: Secondary | ICD-10-CM | POA: Diagnosis present

## 2018-05-06 DIAGNOSIS — J690 Pneumonitis due to inhalation of food and vomit: Secondary | ICD-10-CM | POA: Diagnosis present

## 2018-05-06 DIAGNOSIS — R0902 Hypoxemia: Secondary | ICD-10-CM

## 2018-05-06 DIAGNOSIS — Y9259 Other trade areas as the place of occurrence of the external cause: Secondary | ICD-10-CM

## 2018-05-06 DIAGNOSIS — A419 Sepsis, unspecified organism: Principal | ICD-10-CM | POA: Diagnosis present

## 2018-05-06 DIAGNOSIS — K7200 Acute and subacute hepatic failure without coma: Secondary | ICD-10-CM | POA: Diagnosis present

## 2018-05-06 DIAGNOSIS — M6282 Rhabdomyolysis: Secondary | ICD-10-CM | POA: Diagnosis present

## 2018-05-06 DIAGNOSIS — J9601 Acute respiratory failure with hypoxia: Secondary | ICD-10-CM | POA: Diagnosis present

## 2018-05-06 DIAGNOSIS — E875 Hyperkalemia: Secondary | ICD-10-CM | POA: Diagnosis present

## 2018-05-06 DIAGNOSIS — R29898 Other symptoms and signs involving the musculoskeletal system: Secondary | ICD-10-CM | POA: Diagnosis present

## 2018-05-06 DIAGNOSIS — R4182 Altered mental status, unspecified: Secondary | ICD-10-CM

## 2018-05-06 DIAGNOSIS — J189 Pneumonia, unspecified organism: Secondary | ICD-10-CM

## 2018-05-06 DIAGNOSIS — I248 Other forms of acute ischemic heart disease: Secondary | ICD-10-CM | POA: Diagnosis present

## 2018-05-06 DIAGNOSIS — Z792 Long term (current) use of antibiotics: Secondary | ICD-10-CM

## 2018-05-06 DIAGNOSIS — N179 Acute kidney failure, unspecified: Secondary | ICD-10-CM | POA: Diagnosis present

## 2018-05-06 DIAGNOSIS — Z590 Homelessness: Secondary | ICD-10-CM

## 2018-05-06 LAB — BLOOD GAS, ARTERIAL
ACID-BASE DEFICIT: 3.9 mmol/L — AB (ref 0.0–2.0)
Bicarbonate: 20.4 mmol/L (ref 20.0–28.0)
Drawn by: 232811
O2 CONTENT: 15 L/min
O2 SAT: 93.9 %
PATIENT TEMPERATURE: 99.5
PCO2 ART: 37.8 mmHg (ref 32.0–48.0)
PO2 ART: 73.8 mmHg — AB (ref 83.0–108.0)
pH, Arterial: 7.354 (ref 7.350–7.450)

## 2018-05-06 LAB — HEPATIC FUNCTION PANEL
ALBUMIN: 3.9 g/dL (ref 3.5–5.0)
ALK PHOS: 112 U/L (ref 38–126)
ALT: 35 U/L (ref 0–44)
AST: 81 U/L — AB (ref 15–41)
BILIRUBIN DIRECT: 0.1 mg/dL (ref 0.0–0.2)
Indirect Bilirubin: 0.7 mg/dL (ref 0.3–0.9)
Total Bilirubin: 0.8 mg/dL (ref 0.3–1.2)
Total Protein: 7.7 g/dL (ref 6.5–8.1)

## 2018-05-06 LAB — LACTIC ACID, PLASMA
LACTIC ACID, VENOUS: 5.5 mmol/L — AB (ref 0.5–1.9)
LACTIC ACID, VENOUS: 3 mmol/L — AB (ref 0.5–1.9)

## 2018-05-06 LAB — BASIC METABOLIC PANEL
ANION GAP: 20 — AB (ref 5–15)
BUN: 17 mg/dL (ref 6–20)
CHLORIDE: 102 mmol/L (ref 98–111)
CO2: 20 mmol/L — ABNORMAL LOW (ref 22–32)
Calcium: 9.2 mg/dL (ref 8.9–10.3)
Creatinine, Ser: 2.16 mg/dL — ABNORMAL HIGH (ref 0.61–1.24)
GFR calc Af Amer: 46 mL/min — ABNORMAL LOW (ref >60)
GFR calc non Af Amer: 40 mL/min — ABNORMAL LOW (ref >60)
GLUCOSE: 226 mg/dL — AB (ref 70–99)
POTASSIUM: 5.7 mmol/L — AB (ref 3.5–5.1)
Sodium: 142 mmol/L (ref 135–145)

## 2018-05-06 LAB — CBC WITH DIFFERENTIAL/PLATELET
Basophils Absolute: 0 10*3/uL (ref 0.0–0.1)
Basophils Relative: 0 %
Eosinophils Absolute: 0.1 10*3/uL (ref 0.0–0.7)
Eosinophils Relative: 0 %
HCT: 50.9 % (ref 39.0–52.0)
HEMOGLOBIN: 17.5 g/dL — AB (ref 13.0–17.0)
LYMPHS PCT: 7 %
Lymphs Abs: 1.7 10*3/uL (ref 0.7–4.0)
MCH: 29.8 pg (ref 26.0–34.0)
MCHC: 34.4 g/dL (ref 30.0–36.0)
MCV: 86.6 fL (ref 78.0–100.0)
Monocytes Absolute: 0.4 10*3/uL (ref 0.1–1.0)
Monocytes Relative: 2 %
NEUTROS ABS: 21.4 10*3/uL — AB (ref 1.7–7.7)
NEUTROS PCT: 91 %
Platelets: 290 10*3/uL (ref 150–400)
RBC: 5.88 MIL/uL — AB (ref 4.22–5.81)
RDW: 13.1 % (ref 11.5–15.5)
WBC: 23.6 10*3/uL — AB (ref 4.0–10.5)

## 2018-05-06 LAB — RAPID URINE DRUG SCREEN, HOSP PERFORMED
AMPHETAMINES: NOT DETECTED
BENZODIAZEPINES: NOT DETECTED
Barbiturates: NOT DETECTED
Cocaine: NOT DETECTED
OPIATES: NOT DETECTED
TETRAHYDROCANNABINOL: NOT DETECTED

## 2018-05-06 LAB — TROPONIN I: Troponin I: 0.42 ng/mL (ref <0.03)

## 2018-05-06 LAB — CK: Total CK: 1534 U/L — ABNORMAL HIGH (ref 49–397)

## 2018-05-06 LAB — ETHANOL: Alcohol, Ethyl (B): <10 mg/dL (ref <10)

## 2018-05-06 MED ORDER — NALOXONE HCL 2 MG/2ML IJ SOSY
PREFILLED_SYRINGE | INTRAMUSCULAR | Status: AC
  Administered 2018-05-06: 1 mg
  Filled 2018-05-06: qty 2

## 2018-05-06 MED ORDER — VANCOMYCIN HCL IN DEXTROSE 1-5 GM/200ML-% IV SOLN
1000.0000 mg | INTRAVENOUS | Status: AC
  Administered 2018-05-06: 1000 mg via INTRAVENOUS
  Filled 2018-05-06: qty 200

## 2018-05-06 MED ORDER — SODIUM CHLORIDE 0.9 % IV BOLUS
1000.0000 mL | Freq: Once | INTRAVENOUS | Status: AC
  Administered 2018-05-06: 1000 mL via INTRAVENOUS

## 2018-05-06 MED ORDER — PIPERACILLIN-TAZOBACTAM 3.375 G IVPB 30 MIN
3.3750 g | Freq: Once | INTRAVENOUS | Status: AC
Start: 2018-05-06 — End: 2018-05-06
  Administered 2018-05-06: 3.375 g via INTRAVENOUS
  Filled 2018-05-06: qty 50

## 2018-05-06 MED ORDER — LORAZEPAM 2 MG/ML IJ SOLN
0.5000 mg | Freq: Once | INTRAMUSCULAR | Status: AC
  Administered 2018-05-06: 0.5 mg via INTRAVENOUS
  Filled 2018-05-06: qty 1

## 2018-05-06 NOTE — Progress Notes (Signed)
A consult was received from an ED physician for Vancomycin per pharmacy dosing.  The patient's profile has been reviewed for ht/wt/allergies/indication/available labs.   A one time order has been placed for Vancomycin 1gm.  Further antibiotics/pharmacy consults should be ordered by admitting physician if indicated.                       Thank you, Maryellen PilePoindexter, Obrian Bulson Trefz, PharmD 05/06/2018  8:20 PM

## 2018-05-06 NOTE — ED Notes (Signed)
ED TO INPATIENT HANDOFF REPORT  Name/Age/Gender Gerald Oliver 27 y.o. male  Code Status Code Status History    Date Active Date Inactive Code Status Order ID Comments User Context   09/23/2017 1522 09/23/2017 2055 Full Code [id number]  Donne Hazel, MD ED   05/14/2014 2151 05/16/2014 1914 Full Code [id number]  Berle Mull, MD ED      Home/SNF/Other Home  Chief Complaint OD  Level of Care/Admitting Diagnosis ED Disposition    ED Disposition Condition [place]: [facility] [[id number]]  Level of Care: Stepdown [14]  Admit to SDU based on following criteria: Respiratory Distress:  Frequent assessment and/or intervention to maintain adequate ventilation/respiration, pulmonary toilet, and respiratory treatment.  Diagnosis: Acute respiratory failure with hypoxia [facility]) [[id number]]  Admitting Physician: Rise Patience [phone]  Attending Physician: Rise Patience [phone]  Estimated length of stay: past midnight tomorrow  Certification:: I certify this patient will need inpatient services for at least 2 midnights  PT Class (Do Not Modify): Inpatient [101]  PT Acc Code (Do Not Modify): Private [1]       Medical History Past Medical History:  Diagnosis Date  . Medical history non-contributory     Allergies No Known Allergies  IV Location/Drains/Wounds Patient Lines/Drains/Airways Status   Active Line/Drains/Airways    Name:   Placement date:   Placement time:   Site:   Days:   Peripheral IV 05/06/18 Left Antecubital   05/06/18    2039    Antecubital   less than 1   Peripheral IV 05/06/18 Right Forearm   05/06/18    2207    Forearm   less than 1          Labs/Imaging Results for orders placed or performed during the hospital encounter of 05/06/18 (from the past 48 hour(s))  CBC with Differential     Status: Abnormal   Collection Time: 05/06/18  7:42 PM  Result Value Ref Range   WBC 23.6 (H) 4.0 - 10.5 K/uL   RBC 5.88 (H)  4.22 - 5.81 MIL/uL   Hemoglobin 17.5 (H) 13.0 - 17.0 g/dL   HCT 50.9 39.0 - 52.0 %   MCV 86.6 78.0 - 100.0 fL   MCH 29.8 26.0 - 34.0 pg   MCHC 34.4 30.0 - 36.0 g/dL   RDW 13.1 11.5 - 15.5 %   Platelets 290 150 - 400 K/uL   Neutrophils Relative % 91 %   Neutro Abs 21.4 (H) 1.7 - 7.7 K/uL   Lymphocytes Relative 7 %   Lymphs Abs 1.7 0.7 - 4.0 K/uL   Monocytes Relative 2 %   Monocytes Absolute 0.4 0.1 - 1.0 K/uL   Eosinophils Relative 0 %   Eosinophils Absolute 0.1 0.0 - 0.7 K/uL   Basophils Relative 0 %   Basophils Absolute 0.0 0.0 - 0.1 K/uL    Comment: Performed at [facility], [place] [address], [place], [place] [address]  Basic metabolic panel     Status: Abnormal   Collection Time: 05/06/18  7:42 PM  Result Value Ref Range   Sodium 142 135 - 145 mmol/L   Potassium 5.7 (H) 3.5 - 5.1 mmol/L    Comment: SLIGHT HEMOLYSIS   Chloride 102 98 - 111 mmol/L    Comment: Please note change in reference range.   CO2 20 (L) 22 - 32 mmol/L   Glucose, Bld 226 (H) 70 - 99 mg/dL    Comment: Please  note change in reference range.   BUN 17 6 - 20 mg/dL    Comment: Please note change in reference range.   Creatinine, Ser 2.16 (H) 0.61 - 1.24 mg/dL   Calcium 9.2 8.9 - 10.3 mg/dL   GFR calc non Af Amer 40 (L) >60 mL/min   GFR calc Af Amer 46 (L) >60 mL/min    Comment: (NOTE) The eGFR has been calculated using the CKD EPI equation. This calculation has not been validated in all clinical situations. eGFR's persistently <60 mL/min signify possible Chronic Kidney Disease.    Anion gap 20 (H) 5 - 15    Comment: Performed at [facility], [place] [address] [place], [place] [address]  Troponin I     Status: Abnormal   Collection Time: 05/06/18  8:04 PM  Result Value Ref Range   Troponin I 0.42 (HH) <0.03 ng/mL    Comment: CRITICAL RESULT CALLED TO, READ BACK BY AND VERIFIED WITH: E COGGIN,RN 05/06/18 2133 RHOLMES Performed at [facility], [place] [address] [place], [place] [address]   Hepatic function panel     Status: Abnormal   Collection Time: 05/06/18  8:04 PM  Result Value Ref Range   Total Protein 7.7 6.5 - 8.1 g/dL   Albumin 3.9 3.5 - 5.0 g/dL   AST 81 (H) 15 - 41 U/L   ALT 35 0 - 44 U/L    Comment: Please note change in reference range.   Alkaline Phosphatase 112 38 - 126 U/L   Total Bilirubin 0.8 0.3 - 1.2 mg/dL   Bilirubin, Direct 0.1 0.0 - 0.2 mg/dL    Comment: Please note change in reference range.   Indirect Bilirubin 0.7 0.3 - 0.9 mg/dL    Comment: Performed at [facility], [place] [address], [place], [place] [address]  CK     Status: Abnormal   Collection Time: 05/06/18  8:04 PM  Result Value Ref Range   Total CK 1,534 (H) 49 - 397 U/L    Comment: Performed at [facility], [place] [address], [place], [place] [address]  Lactic acid, plasma     Status: Abnormal   Collection Time: 05/06/18  8:06 PM  Result Value Ref Range   Lactic Acid, Venous 5.5 (HH) 0.5 - 1.9 mmol/L    Comment: CRITICAL RESULT CALLED TO, READ BACK BY AND VERIFIED WITH: E COGGIN,RN 05/06/18 2106 RHOLMES Performed at [facility], [place] [address] [place], [place] [address]   Ethanol     Status: None   Collection Time: 05/06/18  8:06 PM  Result Value Ref Range   Alcohol, Ethyl (B) <10 <10 mg/dL    Comment: (NOTE) Lowest detectable limit for serum alcohol is 10 mg/dL. For medical purposes only. Performed at [facility], [place] [address], [place], [place] [address]   Blood gas, arterial     Status: Abnormal   Collection Time: 05/06/18  8:15 PM  Result Value Ref Range   O2 Content 15.0 L/min   Delivery systems NON-REBREATHER OXYGEN MASK    pH, Arterial 7.354 7.350 - 7.450   pCO2 arterial 37.8 32.0 - 48.0 mmHg   pO2, Arterial 73.8 (L) 83.0 - 108.0 mmHg   Bicarbonate 20.4 20.0 - 28.0 mmol/L   Acid-base deficit 3.9 (H) 0.0 - 2.0 mmol/L   O2 Saturation 93.9 %    Patient temperature 99.5    Collection site RIGHT RADIAL    Drawn by [id number]  Sample type ARTERIAL    Allens test (pass/fail) PASS PASS    Comment: Performed at [facility], [place] [address] [place], [place] [address]  Lactic acid, plasma     Status: Abnormal   Collection Time: 05/06/18 10:06 PM  Result Value Ref Range   Lactic Acid, Venous 3.0 (HH) 0.5 - 1.9 mmol/L    Comment: CRITICAL RESULT CALLED TO, READ BACK BY AND VERIFIED WITH: A Penney Domanski,RN 05/06/18 2249 RHOLMES Performed at [facility], [place] [address], [place], [place] [address]    Dg Chest 1 View  Result Date: 05/06/2018 CLINICAL DATA:  Heroin overdose EXAM: CHEST  1 VIEW COMPARISON:  September 23, 2017 FINDINGS: There is airspace consolidation throughout most of the right lung. Left lung is clear. Heart size and pulmonary vascularity are normal. No adenopathy. No bone lesions. No pneumothorax. IMPRESSION: Widespread airspace consolidation throughout much of the right lung. Suspect multifocal pneumonia versus aspiration. Atypical distribution of pulmonary edema could be contributory. Left lung clear. No adenopathy evident. Electronically Signed   By: Lowella Grip III M.D.   On: 05/06/2018 20:09   Ct Head Wo Contrast  Result Date: 05/06/2018 CLINICAL DATA:  Heroin overdose with altered mental status EXAM: CT HEAD WITHOUT CONTRAST TECHNIQUE: Contiguous axial images were obtained from the base of the skull through the vertex without intravenous contrast. COMPARISON:  None. FINDINGS: Brain: The ventricles are normal in size and configuration. There is no evident intracranial mass, hemorrhage, extra-axial fluid collection, or midline shift. Gray-white compartments appear normal. No acute infarct evident. Vascular: No hyperdense vessel. No appreciable vascular calcification evident. Skull: Bony calvarium appears intact. Sinuses/Orbits: There is opacification in inferior right maxillary antrum. There is  mucosal thickening in several ethmoid air cells. Orbits appear symmetric bilaterally. Other: Mastoid air cells are clear. IMPRESSION: Areas of paranasal sinus disease.  Study otherwise unremarkable. Electronically Signed   By: Lowella Grip III M.D.   On: 05/06/2018 21:17    Pending Labs Unresulted Labs (From admission, onward)   Start     Ordered   05/06/18 2232  Culture, blood (routine x 2)  BLOOD CULTURE X 2,   R     05/06/18 2231   05/06/18 1959  Urine rapid drug screen (hosp performed)  STAT,   STAT    Comments:  In and out cath please    05/06/18 1959      Vitals/Pain Today's Vitals   05/06/18 2105 05/06/18 2201 05/06/18 2247 05/06/18 2300  BP: 99/62 (!) 81/49 (!) 80/52 (!) 92/58  Pulse: (!) 118 (!) 107 90 (!) 102  Resp: (!) '22 18 15 ' (!) 22  Temp:      TempSrc:      SpO2: 93% 92% 96% 94%    Isolation Precautions No active isolations  Medications Medications  naloxone (NARCAN) 2 MG/2ML injection (1 mg  Given 05/06/18 1939)  piperacillin-tazobactam (ZOSYN) IVPB 3.375 g (0 g Intravenous Stopped 05/06/18 2112)  sodium chloride 0.9 % bolus 1,000 mL (1,000 mLs Intravenous New Bag/Given 05/06/18 2042)  vancomycin (VANCOCIN) IVPB 1000 mg/200 mL premix (0 mg Intravenous Stopped 05/06/18 2232)  LORazepam (ATIVAN) injection 0.5 mg (0.5 mg Intravenous Given 05/06/18 2154)  sodium chloride 0.9 % bolus 1,000 mL (0 mLs Intravenous Stopped 05/06/18 2316)  sodium chloride 0.9 % bolus 1,000 mL (0 mLs Intravenous Stopped 05/06/18 2315)    Mobility walks

## 2018-05-06 NOTE — ED Notes (Signed)
Made EDP aware of low trending blood pressures.

## 2018-05-06 NOTE — ED Notes (Signed)
Spoke with Gerald Oliver, troponin and hepatic panel to be added on to existing blood work.

## 2018-05-06 NOTE — ED Notes (Signed)
Respiratory contacted for ABG

## 2018-05-06 NOTE — ED Notes (Signed)
Bed: RESB Expected date:  Expected time:  Means of arrival:  Comments: 27 yo M/heroin OD

## 2018-05-06 NOTE — ED Triage Notes (Signed)
Pt bib EMS and presents with an overdose.  Pt was found by brother unresponsive and blue. Pt's brother gave 4mg  intranasal Narcan shortly before 18:50 and called EMS.  On arrival, pt was alert but non verbal.  Pt was blue in the face and given oxygen via non-rebreather.  EMS started an 18g IV in pt's left AC.

## 2018-05-06 NOTE — ED Notes (Addendum)
Per verbal order from hospitalist, gingerale given to pt for stroke swallow screen. Pt was successful.   Per hospitalist, verbal order for NS at 11950ml/hr.

## 2018-05-06 NOTE — ED Provider Notes (Signed)
[facility]-EMERGENCY DEPT Provider Note   CSN: [id number] Arrival date & time: 05/06/18  1929     History   Chief Complaint Chief Complaint  Patient presents with  . Drug Overdose    HPI Gerald Oliver is a 27 y.o. male.  27 year old male with prior history as detailed below presents as a presumed OD.  Patient was apparently found by his brother unresponsive and cyanotic.  Per EMS, the patient was given 4 mg of intranasal Narcan prior to their arrival (from the brother).  EMS reports that the patient was cyanotic initially but this improved with 100% FM O2.  EMS did not provide BVM ventilation.  Patient is awake upon arrival.  Patient is not answering questions.  Patient is mildly tachypneic and tachycardic.  It is unclear exactly how long the patient was down prior to initial administration of Narcan. Patient is a poor historian.   The history is provided by the patient and the EMS personnel.  Drug Overdose  This is a recurrent problem. Episode onset: unclear  The problem occurs constantly. The problem has not changed since onset.Pertinent negatives include no chest pain, no abdominal pain, no headaches and no shortness of breath.    Past Medical History:  Diagnosis Date  . Medical history non-contributory     Patient Active Problem List   Diagnosis Date Noted  . Acute hypoxemic respiratory failure (HCC) 09/23/2017  . Heroin overdose (HCC) 09/23/2017  . Drug overdose 05/14/2014  . Heroin abuse (HCC) 05/14/2014  . Aspiration pneumonia (HCC) 05/14/2014  . AKI (acute kidney injury) (HCC) 05/14/2014  . Unresponsive 05/14/2014    Past Surgical History:  Procedure Laterality Date  . WISDOM TOOTH EXTRACTION          Home Medications    Prior to Admission medications   Medication Sig Start Date End Date Taking? Authorizing Provider  amoxicillin-clavulanate (AUGMENTIN) 875-125 MG per tablet Take 1 tablet by mouth 2 (two) times daily. 05/16/14    Viyuoh, Rolland Bimler, MD  naproxen (NAPROSYN) 500 MG tablet Take 1 tablet (500 mg total) by mouth 2 (two) times daily with a meal. 05/18/15   Eber Hong, MD    Family History No family history on file.  Social History Social History   Tobacco Use  . Smoking status: Former Smoker    Types: Cigarettes    Last attempt to quit: 05/14/2008    Years since quitting: 9.9  . Smokeless tobacco: Never Used  Substance Use Topics  . Alcohol use: No    Comment: rarely  . Drug use: Yes    Comment: heroin     Allergies   Patient has no known allergies.   Review of Systems Review of Systems  Unable to perform ROS: Acuity of condition  Respiratory: Negative for shortness of breath.   Cardiovascular: Negative for chest pain.  Gastrointestinal: Negative for abdominal pain.  Neurological: Negative for headaches.     Physical Exam Updated Vital Signs BP 115/80 (BP Location: Right Arm)   Pulse (!) 134   Temp 99.5 F (37.5 C) (Axillary)   Resp (!) 32   SpO2 93%   Physical Exam  Constitutional: He appears well-developed and well-nourished. No distress.  HENT:  Head: Normocephalic and atraumatic.  Mouth/Throat: Oropharynx is clear and moist.  Eyes: Pupils are equal, round, and reactive to light. Conjunctivae and EOM are normal.  Neck: Normal range of motion. Neck supple.  Cardiovascular: Normal rate, regular rhythm and normal heart sounds.  Pulmonary/Chest: Effort normal and breath sounds normal. No respiratory distress.  Abdominal: Soft. He exhibits no distension. There is no tenderness.  Musculoskeletal: He exhibits no edema or deformity.  Slightly decreased movement of RUE/RLE vs left - suspect possible early rhabdomyolysis vs CNS process  No palpable hard compartments on all 4 extremities.   Neurological:  Awake   Non verbal   Minimal movement of RUE and RLE   Skin: Skin is warm and dry.  Psychiatric: He has a normal mood and affect.  Nursing note and vitals  reviewed.    ED Treatments / Results  Labs (all labs ordered are listed, but only abnormal results are displayed) Labs Reviewed  CBC WITH DIFFERENTIAL/PLATELET - Abnormal; Notable for the following components:      Result Value   WBC 23.6 (*)    RBC 5.88 (*)    Hemoglobin 17.5 (*)    Neutro Abs 21.4 (*)    All other components within normal limits  BASIC METABOLIC PANEL - Abnormal; Notable for the following components:   Potassium 5.7 (*)    CO2 20 (*)    Glucose, Bld 226 (*)    Creatinine, Ser 2.16 (*)    GFR calc non Af Amer 40 (*)    GFR calc Af Amer 46 (*)    Anion gap 20 (*)    All other components within normal limits  BLOOD GAS, ARTERIAL - Abnormal; Notable for the following components:   pO2, Arterial 73.8 (*)    Acid-base deficit 3.9 (*)    All other components within normal limits  TROPONIN I - Abnormal; Notable for the following components:   Troponin I 0.42 (*)    All other components within normal limits  LACTIC ACID, PLASMA - Abnormal; Notable for the following components:   Lactic Acid, Venous 5.5 (*)    All other components within normal limits  LACTIC ACID, PLASMA - Abnormal; Notable for the following components:   Lactic Acid, Venous 3.0 (*)    All other components within normal limits  HEPATIC FUNCTION PANEL - Abnormal; Notable for the following components:   AST 81 (*)    All other components within normal limits  CK - Abnormal; Notable for the following components:   Total CK 1,534 (*)    All other components within normal limits  CULTURE, BLOOD (ROUTINE X 2)  CULTURE, BLOOD (ROUTINE X 2)  ETHANOL  RAPID URINE DRUG SCREEN, HOSP PERFORMED    EKG EKG Interpretation  Date/Time:  Sunday May 06 2018 19:35:22 EDT Ventricular Rate:  121 PR Interval:    QRS Duration: 80 QT Interval:  315 QTC Calculation: 447 R Axis:   76 Text Interpretation:  Sinus tachycardia LAE, consider biatrial enlargement Confirmed by Kristine Royal [id number]) on 05/06/2018  7:46:41 PM   Radiology Dg Chest 1 View  Result Date: 05/06/2018 CLINICAL DATA:  Heroin overdose EXAM: CHEST  1 VIEW COMPARISON:  September 23, 2017 FINDINGS: There is airspace consolidation throughout most of the right lung. Left lung is clear. Heart size and pulmonary vascularity are normal. No adenopathy. No bone lesions. No pneumothorax. IMPRESSION: Widespread airspace consolidation throughout much of the right lung. Suspect multifocal pneumonia versus aspiration. Atypical distribution of pulmonary edema could be contributory. Left lung clear. No adenopathy evident. Electronically Signed   By: Bretta Bang III M.D.   On: 05/06/2018 20:09    Procedures Procedures (including critical care time) CRITICAL CARE Performed by: Wynetta Fines   Total critical care time:  45 minutes  Critical care time was exclusive of separately billable procedures and treating other patients.  Critical care was necessary to treat or prevent imminent or life-threatening deterioration.  Critical care was time spent personally by me on the following activities: development of treatment plan with patient and/or surrogate as well as nursing, discussions with consultants, evaluation of patient's response to treatment, examination of patient, obtaining history from patient or surrogate, ordering and performing treatments and interventions, ordering and review of laboratory studies, ordering and review of radiographic studies, pulse oximetry and re-evaluation of patient's condition.   Medications Ordered in ED Medications  piperacillin-tazobactam (ZOSYN) IVPB 3.375 g (3.375 g Intravenous New Bag/Given 05/06/18 2039)  sodium chloride 0.9 % bolus 1,000 mL (1,000 mLs Intravenous New Bag/Given 05/06/18 2042)  vancomycin (VANCOCIN) IVPB 1000 mg/200 mL premix (has no administration in time range)  naloxone [facility](NARCAN) 2 MG/2ML injection (1 mg  Given 05/06/18 1939)     Initial Impression / Assessment and Plan / ED Course   I have reviewed the triage vital signs and the nursing notes.  Pertinent labs & imaging results that were available during my care of the patient were reviewed by me and considered in my medical decision making (see chart for details).     2200 Neuro ([place]) aware of case. Suggests MRI if symptoms fail to improve/persist. He done   MDM  Screen complete  Patient is presenting for evaluation following suspected OD.  Patient appears to be status post narcotic OD. Patient's downtime prior to administration of narcan is unclear. Patient's mental status did not return to normal immediately following Narcan - suspect possible element of anoxic injury. CT head without acute abnormality. Additionally, patient with hypoxia and likely aspiration on CXR.  Patient with degree of AKI and possible early rhabdomyolysis. No clear compartment syndrome detected on initial workup - but patient remains at risk for same.   Patient will be admitted for further workup and treatment.   Hospitalist service aware of case and will evaluate for admission.    Final Clinical Impressions(s) / ED Diagnoses   Final diagnoses:  Drug overdose, undetermined intent, initial encounter  Hypoxia  AKI (acute kidney injury) (HCC)  Altered mental status, unspecified altered mental status type    ED Discharge Orders    None       Wynetta FinesMessick, Nataliyah Packham C, MD 05/06/18 2309

## 2018-05-07 ENCOUNTER — Inpatient Hospital Stay (HOSPITAL_COMMUNITY): Payer: Self-pay

## 2018-05-07 ENCOUNTER — Other Ambulatory Visit: Payer: Self-pay

## 2018-05-07 ENCOUNTER — Encounter (HOSPITAL_COMMUNITY): Payer: Self-pay | Admitting: Internal Medicine

## 2018-05-07 DIAGNOSIS — R739 Hyperglycemia, unspecified: Secondary | ICD-10-CM

## 2018-05-07 DIAGNOSIS — R29898 Other symptoms and signs involving the musculoskeletal system: Secondary | ICD-10-CM | POA: Diagnosis present

## 2018-05-07 DIAGNOSIS — M6282 Rhabdomyolysis: Secondary | ICD-10-CM | POA: Diagnosis present

## 2018-05-07 DIAGNOSIS — J9601 Acute respiratory failure with hypoxia: Secondary | ICD-10-CM

## 2018-05-07 DIAGNOSIS — T50904A Poisoning by unspecified drugs, medicaments and biological substances, undetermined, initial encounter: Secondary | ICD-10-CM

## 2018-05-07 DIAGNOSIS — A419 Sepsis, unspecified organism: Principal | ICD-10-CM

## 2018-05-07 DIAGNOSIS — N179 Acute kidney failure, unspecified: Secondary | ICD-10-CM | POA: Diagnosis present

## 2018-05-07 DIAGNOSIS — J690 Pneumonitis due to inhalation of food and vomit: Secondary | ICD-10-CM

## 2018-05-07 LAB — COMPREHENSIVE METABOLIC PANEL
ALK PHOS: 62 U/L (ref 38–126)
ALT: 73 U/L — ABNORMAL HIGH (ref 0–44)
AST: 233 U/L — ABNORMAL HIGH (ref 15–41)
Albumin: 3.1 g/dL — ABNORMAL LOW (ref 3.5–5.0)
Anion gap: 8 (ref 5–15)
BILIRUBIN TOTAL: 0.8 mg/dL (ref 0.3–1.2)
BUN: 17 mg/dL (ref 6–20)
CALCIUM: 7.5 mg/dL — AB (ref 8.9–10.3)
CO2: 20 mmol/L — ABNORMAL LOW (ref 22–32)
Chloride: 108 mmol/L (ref 98–111)
Creatinine, Ser: 1.41 mg/dL — ABNORMAL HIGH (ref 0.61–1.24)
GFR calc Af Amer: >60 mL/min (ref >60)
Glucose, Bld: 103 mg/dL — ABNORMAL HIGH (ref 70–99)
Potassium: 6.3 mmol/L (ref 3.5–5.1)
Sodium: 136 mmol/L (ref 135–145)
TOTAL PROTEIN: 6 g/dL — AB (ref 6.5–8.1)

## 2018-05-07 LAB — CBC WITH DIFFERENTIAL/PLATELET
Band Neutrophils: 8 %
Basophils Absolute: 0 10*3/uL (ref 0.0–0.1)
Basophils Relative: 0 %
Eosinophils Absolute: 0 10*3/uL (ref 0.0–0.7)
Eosinophils Relative: 0 %
HEMATOCRIT: 43.2 % (ref 39.0–52.0)
Hemoglobin: 14.9 g/dL (ref 13.0–17.0)
Lymphocytes Relative: 5 %
Lymphs Abs: 1.4 10*3/uL (ref 0.7–4.0)
MCH: 29.4 pg (ref 26.0–34.0)
MCHC: 34.5 g/dL (ref 30.0–36.0)
MCV: 85.2 fL (ref 78.0–100.0)
Monocytes Absolute: 0.8 10*3/uL (ref 0.1–1.0)
Monocytes Relative: 3 %
Neutro Abs: 25 10*3/uL — ABNORMAL HIGH (ref 1.7–7.7)
Neutrophils Relative %: 84 %
Platelets: 245 10*3/uL (ref 150–400)
RBC: 5.07 MIL/uL (ref 4.22–5.81)
RDW: 13.2 % (ref 11.5–15.5)
WBC: 27.2 10*3/uL — AB (ref 4.0–10.5)

## 2018-05-07 LAB — URINALYSIS, ROUTINE W REFLEX MICROSCOPIC
Bilirubin Urine: NEGATIVE
GLUCOSE, UA: 50 mg/dL — AB
Ketones, ur: NEGATIVE mg/dL
LEUKOCYTES UA: NEGATIVE
Nitrite: NEGATIVE
PH: 5 (ref 5.0–8.0)
Protein, ur: NEGATIVE mg/dL
SPECIFIC GRAVITY, URINE: 1.011 (ref 1.005–1.030)

## 2018-05-07 LAB — LACTIC ACID, PLASMA: LACTIC ACID, VENOUS: 2.5 mmol/L — AB (ref 0.5–1.9)

## 2018-05-07 LAB — PROCALCITONIN: Procalcitonin: 22.15 ng/mL

## 2018-05-07 LAB — RENAL FUNCTION PANEL
ALBUMIN: 2.9 g/dL — AB (ref 3.5–5.0)
ANION GAP: 6 (ref 5–15)
Albumin: 2.8 g/dL — ABNORMAL LOW (ref 3.5–5.0)
Anion gap: 6 (ref 5–15)
BUN: 13 mg/dL (ref 6–20)
BUN: 9 mg/dL (ref 6–20)
CALCIUM: 8.2 mg/dL — AB (ref 8.9–10.3)
CHLORIDE: 106 mmol/L (ref 98–111)
CO2: 28 mmol/L (ref 22–32)
CO2: 30 mmol/L (ref 22–32)
CREATININE: 0.95 mg/dL (ref 0.61–1.24)
Calcium: 8.5 mg/dL — ABNORMAL LOW (ref 8.9–10.3)
Chloride: 110 mmol/L (ref 98–111)
Creatinine, Ser: 1 mg/dL (ref 0.61–1.24)
GFR calc Af Amer: >60 mL/min (ref >60)
GFR calc Af Amer: >60 mL/min (ref >60)
GFR calc non Af Amer: >60 mL/min (ref >60)
GFR calc non Af Amer: >60 mL/min (ref >60)
GLUCOSE: 114 mg/dL — AB (ref 70–99)
Glucose, Bld: 139 mg/dL — ABNORMAL HIGH (ref 70–99)
PHOSPHORUS: 2.7 mg/dL (ref 2.5–4.6)
Phosphorus: 1.7 mg/dL — ABNORMAL LOW (ref 2.5–4.6)
Potassium: 4 mmol/L (ref 3.5–5.1)
Potassium: 3.8 mmol/L (ref 3.5–5.1)
SODIUM: 144 mmol/L (ref 135–145)
Sodium: 142 mmol/L (ref 135–145)

## 2018-05-07 LAB — GLUCOSE, CAPILLARY
GLUCOSE-CAPILLARY: 135 mg/dL — AB (ref 70–99)
GLUCOSE-CAPILLARY: 96 mg/dL (ref 70–99)
GLUCOSE-CAPILLARY: 117 mg/dL — AB (ref 70–99)
Glucose-Capillary: 110 mg/dL — ABNORMAL HIGH (ref 70–99)
Glucose-Capillary: 74 mg/dL (ref 70–99)

## 2018-05-07 LAB — BASIC METABOLIC PANEL
ANION GAP: 10 (ref 5–15)
Anion gap: 7 (ref 5–15)
BUN: 18 mg/dL (ref 6–20)
BUN: 14 mg/dL (ref 6–20)
CALCIUM: 7.6 mg/dL — AB (ref 8.9–10.3)
CHLORIDE: 113 mmol/L — AB (ref 98–111)
CO2: 22 mmol/L (ref 22–32)
CO2: 20 mmol/L — ABNORMAL LOW (ref 22–32)
CREATININE: 1.44 mg/dL — AB (ref 0.61–1.24)
Calcium: 8.2 mg/dL — ABNORMAL LOW (ref 8.9–10.3)
Chloride: 112 mmol/L — ABNORMAL HIGH (ref 98–111)
Creatinine, Ser: 1.21 mg/dL (ref 0.61–1.24)
GFR calc Af Amer: >60 mL/min (ref >60)
GFR calc non Af Amer: >60 mL/min (ref >60)
GLUCOSE: 112 mg/dL — AB (ref 70–99)
Glucose, Bld: 148 mg/dL — ABNORMAL HIGH (ref 70–99)
Potassium: 5.7 mmol/L — ABNORMAL HIGH (ref 3.5–5.1)
Potassium: 4.3 mmol/L (ref 3.5–5.1)
SODIUM: 141 mmol/L (ref 135–145)
SODIUM: 143 mmol/L (ref 135–145)

## 2018-05-07 LAB — ECHOCARDIOGRAM COMPLETE
Height: 68 in
Weight: 2359.8 oz

## 2018-05-07 LAB — CK
Total CK: 2760 U/L — ABNORMAL HIGH (ref 49–397)
Total CK: 12125 U/L — ABNORMAL HIGH (ref 49–397)
Total CK: 9098 U/L — ABNORMAL HIGH (ref 49–397)

## 2018-05-07 LAB — CREATININE, URINE, RANDOM: Creatinine, Urine: 72.99 mg/dL

## 2018-05-07 LAB — HIV ANTIBODY (ROUTINE TESTING W REFLEX): HIV Screen 4th Generation wRfx: NONREACTIVE

## 2018-05-07 LAB — BETA-HYDROXYBUTYRIC ACID: Beta-Hydroxybutyric Acid: 0.19 mmol/L (ref 0.05–0.27)

## 2018-05-07 LAB — APTT: APTT: 30 s (ref 24–36)

## 2018-05-07 LAB — NA AND K (SODIUM & POTASSIUM), RAND UR
Potassium Urine: 71 mmol/L
SODIUM UR: 42 mmol/L

## 2018-05-07 LAB — HEMOGLOBIN A1C
Hgb A1c MFr Bld: 5.1 % (ref 4.8–5.6)
Mean Plasma Glucose: 99.67 mg/dL

## 2018-05-07 LAB — MRSA PCR SCREENING: MRSA by PCR: NEGATIVE

## 2018-05-07 LAB — TROPONIN I
TROPONIN I: 0.76 ng/mL — AB (ref <0.03)
Troponin I: 0.75 ng/mL (ref <0.03)
Troponin I: 0.85 ng/mL (ref <0.03)

## 2018-05-07 LAB — PROTIME-INR
INR: 1.33
Prothrombin Time: 16.4 seconds — ABNORMAL HIGH (ref 11.4–15.2)

## 2018-05-07 LAB — CORTISOL: Cortisol, Plasma: 35.5 ug/dL

## 2018-05-07 LAB — OSMOLALITY, URINE: OSMOLALITY UR: 372 mosm/kg (ref 300–900)

## 2018-05-07 MED ORDER — DEXTROSE 50 % IV SOLN: 1.0000 | Freq: Once | INTRAVENOUS | Status: DC

## 2018-05-07 MED ORDER — PHENYLEPHRINE HCL-NACL 10-0.9 MG/250ML-% IV SOLN
0.0000 ug/min | INTRAVENOUS | Status: DC
  Administered 2018-05-07 (×2): 80 ug/min via INTRAVENOUS
  Administered 2018-05-07: 20 ug/min via INTRAVENOUS
  Administered 2018-05-07: 40 ug/min via INTRAVENOUS
  Filled 2018-05-07 (×4): qty 250

## 2018-05-07 MED ORDER — INSULIN REGULAR BOLUS VIA INFUSION: 10.0000 [IU] | Freq: Once | INTRAVENOUS | Status: DC

## 2018-05-07 MED ORDER — INSULIN ASPART 100 UNIT/ML IV SOLN
10.0000 [IU] | Freq: Once | INTRAVENOUS | Status: AC
  Administered 2018-05-07: 10 [IU] via INTRAVENOUS

## 2018-05-07 MED ORDER — ALBUTEROL SULFATE (2.5 MG/3ML) 0.083% IN NEBU
10.0000 mg | INHALATION_SOLUTION | Freq: Once | RESPIRATORY_TRACT | Status: DC
  Filled 2018-05-07: qty 12

## 2018-05-07 MED ORDER — ACETAMINOPHEN 325 MG PO TABS: 650.0000 mg | ORAL_TABLET | ORAL | Status: DC | PRN

## 2018-05-07 MED ORDER — NALOXONE HCL 0.4 MG/ML IJ SOLN
INTRAMUSCULAR | Status: AC
  Administered 2018-05-07: 0.4 mg
  Filled 2018-05-07: qty 1

## 2018-05-07 MED ORDER — ACETAMINOPHEN 650 MG RE SUPP: 650.0000 mg | RECTAL | Status: DC | PRN

## 2018-05-07 MED ORDER — INSULIN ASPART 100 UNIT/ML ~~LOC~~ SOLN
0.0000 [IU] | SUBCUTANEOUS | Status: DC
  Administered 2018-05-07: 2 [IU] via SUBCUTANEOUS

## 2018-05-07 MED ORDER — DEXTROSE 50 % IV SOLN
1.0000 | Freq: Once | INTRAVENOUS | Status: AC
  Administered 2018-05-07: 50 mL via INTRAVENOUS
  Filled 2018-05-07: qty 50

## 2018-05-07 MED ORDER — ENOXAPARIN SODIUM 40 MG/0.4ML ~~LOC~~ SOLN: 40.0000 mg | SUBCUTANEOUS | Status: DC

## 2018-05-07 MED ORDER — PIPERACILLIN-TAZOBACTAM 3.375 G IVPB
3.3750 g | Freq: Three times a day (TID) | INTRAVENOUS | Status: DC
  Administered 2018-05-08: 3.375 g via INTRAVENOUS
  Filled 2018-05-07: qty 50

## 2018-05-07 MED ORDER — HEPARIN SODIUM (PORCINE) 5000 UNIT/ML IJ SOLN: 5000.0000 [IU] | Freq: Three times a day (TID) | INTRAMUSCULAR | Status: DC

## 2018-05-07 MED ORDER — INSULIN ASPART 100 UNIT/ML IV SOLN: 10.0000 [IU] | Freq: Once | INTRAVENOUS | Status: DC

## 2018-05-07 MED ORDER — CALCIUM CHLORIDE 10 % IV SOLN
1.0000 g | Freq: Once | INTRAVENOUS | Status: AC
  Administered 2018-05-07: 1 g via INTRAVENOUS
  Filled 2018-05-07: qty 10

## 2018-05-07 MED ORDER — DEXTROSE 5 % IV SOLN
INTRAVENOUS | Status: DC
  Administered 2018-05-07 – 2018-05-08 (×3): via INTRAVENOUS
  Filled 2018-05-07 (×4): qty 150

## 2018-05-07 MED ORDER — ACETAMINOPHEN 160 MG/5ML PO SOLN: 650.0000 mg | ORAL | Status: DC | PRN

## 2018-05-07 MED ORDER — ALBUTEROL (5 MG/ML) CONTINUOUS INHALATION SOLN
10.0000 mg/h | INHALATION_SOLUTION | RESPIRATORY_TRACT | Status: DC
  Administered 2018-05-07: 10 mg/h via RESPIRATORY_TRACT
  Filled 2018-05-07: qty 20

## 2018-05-07 MED ORDER — HEPARIN SODIUM (PORCINE) 5000 UNIT/ML IJ SOLN
5000.0000 [IU] | Freq: Three times a day (TID) | INTRAMUSCULAR | Status: DC
  Administered 2018-05-08: 5000 [IU] via SUBCUTANEOUS
  Filled 2018-05-07: qty 1

## 2018-05-07 MED ORDER — SODIUM CHLORIDE 0.9 % IV SOLN
INTRAVENOUS | Status: DC
  Administered 2018-05-07: via INTRAVENOUS

## 2018-05-07 MED ORDER — NALOXONE HCL 0.4 MG/ML IJ SOLN: 0.4000 mg | INTRAMUSCULAR | Status: DC | PRN

## 2018-05-07 MED ORDER — VANCOMYCIN HCL IN DEXTROSE 1-5 GM/200ML-% IV SOLN: 1000.0000 mg | INTRAVENOUS | Status: DC

## 2018-05-07 MED ORDER — ENOXAPARIN SODIUM 40 MG/0.4ML ~~LOC~~ SOLN
40.0000 mg | SUBCUTANEOUS | Status: DC
  Administered 2018-05-07: 40 mg via SUBCUTANEOUS
  Filled 2018-05-07: qty 0.4

## 2018-05-07 NOTE — Progress Notes (Signed)
  Echocardiogram 2D Echocardiogram has been performed.  Detra Bores G Quillan Whitter 05/07/2018, 10:00 AM

## 2018-05-07 NOTE — Progress Notes (Signed)
  PROGRESS NOTE  Patient admitted earlier this morning. See H&P. Gerald Oliver is a 27 y.o. male with history of drug abuse admitted in December 2018 with drug overdose was found to be unresponsive by patient's brother at a motel and EMS was called.  Per report, patient's brother attempted giving Narcan.  EMS on arrival found the patient to be hypoxic and mildly cyanotic. In the ED, he was found to be lethargic, urine drug screen negative. He was given narcan with improvement in alterness. Due to refractory hypotension to IVF, PCCM was consulted and patient was given pressors.   On exam, he is alert and oriented. He complains of numbness of right upper extremity as well as numbness of his legs. He denies any chest pain or abdominal pain. Asking for ginger ale.   Work up for sepsis revealed airspace consolidation on right, likely aspiration. MRSA PCR negative. Continue zosyn to cover anaerobe.   Continue Neo-Synephrine, wean per PCCM.   MRI brain pending to rule out neurologic deficit due to complaint of numbness  Echo pending due to elevated troponin. No chest pain per patient.   CT chest also revealed diffuse thickening of the wall of the gallbladder, could indicate cholecystitis, check RUQ US. No abdominal pain on exam but does have elevated LFT   IVF and supportive care for rhabdo and AKI   Treat hyperkalemia and trend BMP    Noralee StainJennifer Alexios Keown, DO [facility] [email or web address] Password [email or web address] 05/07/2018, 12:06 PM

## 2018-05-07 NOTE — Consult Note (Addendum)
.. ..  Name: Gerald Oliver MRN: [id number] DOB: August 15, 1991    ADMISSION DATE:  05/06/2018  CONSULTATION DATE:  05/07/2018  REFERRING MD :  Toniann FailKAKRAKANDY MD  CHIEF COMPLAINT:  Overdose   BRIEF PATIENT DESCRIPTION: 27 yr old male with h/o polysubstance abuse [facility], received 4mg  intranasal Narcan and O2 sat improved ro 100%[facility]  PCCM consulted due to low BP after 3L NS IVF.   SIGNIFICANT EVENTS  overdose  STUDIES:  CTH w/o contrast : Areas of paranasal sinus disease.  Study otherwise unremarkable CXR: Widespread airspace consolidation throughout much of the right lung. Suspect multifocal pneumonia versus aspiration. Atypical distribution of pulmonary edema could be contributory. Left lung clear. No adenopathy evident.  HISTORY OF PRESENT ILLNESS:  History acquired  27 yr old male with PMHx significant for  h/o polysubstance abuse [facility], received 4mg  intranasal Narcan and O2 sat improved ro 100%with [facility]  PCCM consulted due to low BP after 3L NS IVF.   On my evaluation pt is alert and responsive complaining of inability to urinate, he states that he did use opiates tonight is unable to tell me what type but says he snorted 30 grams of it. He states that he feels better in comparison to earlier  PAST MEDICAL HISTORY :   has a past medical history of Medical history non-contributory.  has a past surgical history that includes Wisdom tooth extraction. Prior to Admission medications   Medication Sig Start Date End Date Taking? Authorizing Provider  amoxicillin-clavulanate (AUGMENTIN) 875-125 MG per tablet Take 1 tablet by mouth 2 (two) times daily. 05/16/14   Viyuoh, Rolland BimlerAdeline C, MD  naproxen (NAPROSYN) 500 MG tablet Take 1 tablet (500 mg total) by mouth 2 (two) times daily with a meal. 05/18/15   Eber HongMiller, Brian, MD   No Known Allergies  FAMILY HISTORY:  Family  history is unknown by patient. SOCIAL HISTORY:  reports that he quit smoking about 9 years ago. His smoking use included cigarettes. He has never used smokeless tobacco. He reports that he has current or past drug history. He reports that he does not drink alcohol.  REVIEW OF SYSTEMS:  Pertinent positives/ negatives are bolded Constitutional: Negative for fever, chills, weight loss, malaise/fatigue and diaphoresis.  HENT: Negative for hearing loss, ear pain, nosebleeds, congestion, sore throat, neck pain, tinnitus and ear discharge.   Eyes: Negative for blurred vision, double vision, photophobia, pain, discharge and redness.  Respiratory: Negative for cough, hemoptysis, sputum production, shortness of breath, wheezing and stridor.   Cardiovascular: Negative for chest pain, palpitations, orthopnea, claudication, leg swelling and PND.  Gastrointestinal: Negative for heartburn, nausea, vomiting, abdominal pain, diarrhea, constipation, blood in stool and melena.  Genitourinary: Negative for dysuria, urgency, frequency, hematuria and flank pain. inability to urinate Musculoskeletal: Negative for myalgias, back pain, joint pain and falls.  Skin: Negative for itching and rash.  Neurological: Negative for dizziness, tingling, tremors, sensory change, speech change, focal weakness, seizures, loss of consciousness, weakness and headaches.  Endo/Heme/Allergies: Negative for environmental allergies and polydipsia. Does not bruise/bleed easily.  SUBJECTIVE:   VITAL SIGNS: Temp:  [99.5 F (37.5 C)] 99.5 F (37.5 C) (07/21 1941) Pulse Rate:  [44-134] 85 (07/22 0055) Resp:  [15-40] 27 (07/22 0055) BP: (79-115)/(44-80) 84/53 (07/22 0055) SpO2:  [64 %-98 %] 98 % (07/22 0055) Weight:  [66.9 kg (147 lb 7.8 oz)] 66.9 kg (147 lb 7.8 oz) (07/22  0003)  PHYSICAL EXAMINATION: General:  In no acute distress Neuro:  GCS 15 sleepy but responsive speaking in full sentences HEENT:  NCAT Cardiovascular:  S1 and S2  reg rate and rhythm Lungs:  Coarse breath sounds bilaterally no wheezing Abdomen:  Soft non tender + BS Musculoskeletal:  No edema no atrophy Skin:  Warm dry and intact  Recent Labs  Lab 05/06/18 1942  NA 142  K 5.7*  CL 102  CO2 20*  BUN 17  CREATININE 2.16*  GLUCOSE 226*   Recent Labs  Lab 05/06/18 1942  HGB 17.5*  HCT 50.9  WBC 23.6*  PLT 290   Dg Chest 1 View  Result Date: 05/06/2018 CLINICAL DATA:  Heroin overdose EXAM: CHEST  1 VIEW COMPARISON:  September 23, 2017 FINDINGS: There is airspace consolidation throughout most of the right lung. Left lung is clear. Heart size and pulmonary vascularity are normal. No adenopathy. No bone lesions. No pneumothorax. IMPRESSION: Widespread airspace consolidation throughout much of the right lung. Suspect multifocal pneumonia versus aspiration. Atypical distribution of pulmonary edema could be contributory. Left lung clear. No adenopathy evident. Electronically Signed   By: Bretta Bang III M.D.   On: 05/06/2018 20:09   Ct Head Wo Contrast  Result Date: 05/06/2018 CLINICAL DATA:  Heroin overdose with altered mental status EXAM: CT HEAD WITHOUT CONTRAST TECHNIQUE: Contiguous axial images were obtained from the base of the skull through the vertex without intravenous contrast. COMPARISON:  None. FINDINGS: Brain: The ventricles are normal in size and configuration. There is no evident intracranial mass, hemorrhage, extra-axial fluid collection, or midline shift. Gray-white compartments appear normal. No acute infarct evident. Vascular: No hyperdense vessel. No appreciable vascular calcification evident. Skull: Bony calvarium appears intact. Sinuses/Orbits: There is opacification in inferior right maxillary antrum. There is mucosal thickening in several ethmoid air cells. Orbits appear symmetric bilaterally. Other: Mastoid air cells are clear. IMPRESSION: Areas of paranasal sinus disease.  Study otherwise unremarkable. Electronically Signed    By: Bretta Bang III M.D.   On: 05/06/2018 21:17    ASSESSMENT / PLAN: NEURO: Altered mental status secondary to metabolic encephalopathy S/p overdose- pt admits to snorting 30 g of an opiate medication Suspected heroin use however UDS negative for opiates Received 4 mg Narcan UDS negative for Opiate: raises question is this methadone vs Fentanyl vs synthetic opioid If pt re-sedates may need repeat Narcan dose vs ggt. ETOH <10 GCS CTH reviewed paranasal sinuses otherwise negative Neurochecks Q 1 Check   CARDIAC: Hypotensive-  ? Septic Shock S/p 3 LIVF Requiring Neo ggt MAP goal >18mmHg Recent Labs    05/06/18 2004  CKTOTAL 1,534*  TROPONINI 0.42*  trend troponins Continue cardiac monitoring  PULMONARY: Multifocal pneumonia secondary to possible aspiration event ?Due to acute intoxication- loss of protective reflex S/p Narcan pt's previous cyanosis and oxygenation improved CXR reviewed Widespread airspace consolidation throughout much of the right lung Pt denies any episodes of vomitting, dyspnea, fever or recent illness  CT chest w/o contrast Continue pt on Zosyn IV HOB ?30 degrees Aspiration precautions Continue on supplemental O2 to keep Sat >92%  ID: Multifocal Pneumonia Although pt denies any episodes of vomitting, dyspnea, fever or recent illness CXR findings are concerning Admission PCT- 22.15 F/u blood cultures Resp cx Leukocytosis 27.2 UA leuk negative nitrite negative Continue Zosyn IV  Endocrine: ? DKA vs HHS AG metabolic acidosis with elevated BG No h/o DM or insulin dependence Will try SSI and watch response-> may require insulin ggt Anion Gap  Check Hgb A1 c and beta h Check Cortiosl level  GI: NPO HOB >30 deg NGT Transaminitis AST 233 ALT 73 and ETOH <10 (Pt denies ETOH use)  Heme: No active issues at this time Hgb 17 Hct 50 Plts 290 Hgb<7 transfuse PRBCs No signs of  active bleeding No known  h/o coagulopathy DVT  PPx->Heparin [place] and SCDs  RENAL Acute Kidney Injury pt having difficulty urinating Indwelling foley catheter to be placed given AKI and elevated CK Rhabdomyolysis Continue IVF at this time Baseline Cr Lab Results  Component Value Date   CREATININE 2.16 (H) 05/06/2018   CREATININE 1.10 04/02/2018   CREATININE 1.61 (H) 09/23/2017  Hyperkalemic 6.3 peaked t waves on bedside monitor Calcium IV, insulin + D50 and albuterol nebs     I, Dr Newell Coral have personally reviewed patient's available data, including medical history, events of note, physical examination and test results as part of my evaluation. I have discussed with NP Janyth Contes and other care providers such as pharmacist, RN and RRT. The patient is critically ill with multiple organ systems failure and requires high complexity decision making for assessment and support, frequent evaluation and titration of therapies, application of advanced monitoring technologies and extensive interpretation of multiple databases. Critical Care Time devoted to patient care services described in this note is 52 inutes. This time reflects time of care of this signee Dr Newell Coral. This critical care time does not reflect procedure time, or teaching time or supervisory time of NP [place] Resident etc but could involve care discussion time    DISPOSITION: ICU at [facility]  CC TIME: 52 mins PROGNOSIS: Guarded CODE STATUS: Full FAMILY: not at bedside at time of evaluation   Signed Dr Newell Coral Pulmonary Critical Care Locums  05/07/2018, 1:08 AM

## 2018-05-07 NOTE — Progress Notes (Addendum)
SPB low,83/53  MAP of 58.  Dr. Bryson DamesKakrakan at bedside, starting NEO per order.

## 2018-05-07 NOTE — Progress Notes (Signed)
PT Cancellation Note  Patient Details Name: Gerald ShockMichael J Fluty MRN: [id number] DOB: 05-22-1991   Cancelled Treatment:    Reason Eval/Treat Not Completed: Other (comment) RN recommended to defer until tomorrow. GreasyKaren Donnavin Vandenbrink PT [phone]   [name], [name] Elizabeth 05/07/2018, 4:20 PM

## 2018-05-07 NOTE — Progress Notes (Signed)
eLink Physician-Brief Progress Note Patient Name: Lewis ShockMichael J Knightly DOB: [birth year] MRN: [id number]   Date of Service  05/07/2018  HPI/Events of Note  Obtained EKG for large Twaves on telemetry.  K is 6.3.  ST elevation seen somewhat diffusely but most notable inferiorly.  No chest pain.  Reviewed EKG with cardiology and in this setting do not feel this represents acute coronary syndrome.  eICU Interventions  Given calcium for elevated K and large t waves seen on telemetry.  IVF's being given.  Serial BMP     Intervention Category Intermediate Interventions: Other:  Henry RusselSMITH, Roch Quach, P 05/07/2018, 3:27 AM

## 2018-05-07 NOTE — H&P (Signed)
History and Physical    Gerald Oliver ZOX:[id number] DOB: 1990-10-20 DOA: 05/06/2018  PCP: Patient, No Pcp Per  Patient coming from: Patient was brought from a motel.  Chief Complaint: Possible drug overdose.  History obtained from ER physician as patient appears confused.  No family at the bedside.  HPI: Gerald Oliver is a 27 y.o. male with history of drug abuse admitted in December 2018 with drug overdose was found to be unresponsive by patient's brother at a motel and EMS was called.  Prior to which as per the ER physician patient's brother attempted giving Narcan.  EMS on arrival found the patient to be hypoxic and mildly cyanotic.  On placement of 100% nonrebreather patient's oxygen saturation improved.  Patient mental status slowly improved but not back to baseline.  ED Course: In the ER patient was lethargic and CT head done was unremarkable.  Urine drug screen is negative.  Patient had leukocytosis with acute renal failure.  Lactate was elevated and patient was hypotensive.  Patient was given fluid bolus for sepsis protocol and chest x-ray shows possible aspiration pneumonia for which patient was started on antibiotics.  Patient was also given Narcan following which patient has minimal response and blood pressure transiently improved but again started decreasing.  On exam patient also has right upper extremity weakness which at this time ER physician discussed with neurologist who felt that could be from rhabdomyolysis and patient lying on that side.  It is not known exactly how long patient was on the floor.  Review of Systems: As per HPI, rest all negative.   Past Medical History:  Diagnosis Date  . Medical history non-contributory     Past Surgical History:  Procedure Laterality Date  . WISDOM TOOTH EXTRACTION       reports that he quit smoking about 9 years ago. His smoking use included cigarettes. He has never used smokeless tobacco. He reports that he has current or  past drug history. He reports that he does not drink alcohol.  No Known Allergies  Family History  Family history unknown: Yes    Prior to Admission medications   Medication Sig Start Date End Date Taking? Authorizing Provider  amoxicillin-clavulanate (AUGMENTIN) 875-125 MG per tablet Take 1 tablet by mouth 2 (two) times daily. 05/16/14   Kela MillinViyuoh, Adeline C, MD  naproxen (NAPROSYN) 500 MG tablet Take 1 tablet (500 mg total) by mouth 2 (two) times daily with a meal. 05/18/15   Eber HongMiller, Brian, MD    Physical Exam: Vitals:   05/06/18 2105 05/06/18 2201 05/06/18 2247 05/06/18 2300  BP: 99/62 (!) 81/49 (!) 80/52 (!) 92/58  Pulse: (!) 118 (!) 107 90 (!) 102  Resp: (!) 22 18 15  (!) 22  Temp:      TempSrc:      SpO2: 93% 92% 96% 94%      Constitutional: Moderately built and nourished. Vitals:   05/06/18 2105 05/06/18 2201 05/06/18 2247 05/06/18 2300  BP: 99/62 (!) 81/49 (!) 80/52 (!) 92/58  Pulse: (!) 118 (!) 107 90 (!) 102  Resp: (!) 22 18 15  (!) 22  Temp:      TempSrc:      SpO2: 93% 92% 96% 94%   Eyes: Anicteric no pallor. ENMT: No discharge from the ears eyes nose or mouth. Neck: No mass or.  No neck rigidity.  No JVD appreciated. Respiratory: No rhonchi or crepitations. Cardiovascular: S1-S2 heard no murmurs appreciated. Abdomen: Soft nontender bowel sounds present. Musculoskeletal: No  edema.  No joint effusion. Skin: No rash. Neurologic: Lethargic arousable oriented to his name pupils are equal and reactive to light no facial asymmetry.  Right upper extremity is 1 x 5 in strength and rest of the extremities are 5 x 5 in strength. Psychiatric: Patient is lethargic.   Labs on Admission: I have personally reviewed following labs and imaging studies  CBC: Recent Labs  Lab 05/06/18 1942  WBC 23.6*  NEUTROABS 21.4*  HGB 17.5*  HCT 50.9  MCV 86.6  PLT 290   Basic Metabolic Panel: Recent Labs  Lab 05/06/18 1942  NA 142  K 5.7*  CL 102  CO2 20*  GLUCOSE 226*    BUN 17  CREATININE 2.16*  CALCIUM 9.2   GFR: CrCl cannot be calculated (Unknown ideal weight.). Liver Function Tests: Recent Labs  Lab 05/06/18 2004  AST 81*  ALT 35  ALKPHOS 112  BILITOT 0.8  PROT 7.7  ALBUMIN 3.9   No results for input(s): LIPASE, AMYLASE in the last 168 hours. No results for input(s): AMMONIA in the last 168 hours. Coagulation Profile: No results for input(s): INR, PROTIME in the last 168 hours. Cardiac Enzymes: Recent Labs  Lab 05/06/18 2004  CKTOTAL 1,534*  TROPONINI 0.42*   BNP (last 3 results) No results for input(s): PROBNP in the last 8760 hours. HbA1C: No results for input(s): HGBA1C in the last 72 hours. CBG: No results for input(s): GLUCAP in the last 168 hours. Lipid Profile: No results for input(s): CHOL, HDL, LDLCALC, TRIG, CHOLHDL, LDLDIRECT in the last 72 hours. Thyroid Function Tests: No results for input(s): TSH, T4TOTAL, FREET4, T3FREE, THYROIDAB in the last 72 hours. Anemia Panel: No results for input(s): VITAMINB12, FOLATE, FERRITIN, TIBC, IRON, RETICCTPCT in the last 72 hours. Urine analysis:    Component Value Date/Time   COLORURINE YELLOW 05/14/2014 2035   APPEARANCEUR CLOUDY (A) 05/14/2014 2035   LABSPEC 1.022 05/14/2014 2035   PHURINE 5.0 05/14/2014 2035   GLUCOSEU NEGATIVE 05/14/2014 2035   HGBUR TRACE (A) 05/14/2014 2035   BILIRUBINUR NEGATIVE 05/14/2014 2035   KETONESUR NEGATIVE 05/14/2014 2035   PROTEINUR 100 (A) 05/14/2014 2035   UROBILINOGEN 0.2 05/14/2014 2035   NITRITE NEGATIVE 05/14/2014 2035   LEUKOCYTESUR NEGATIVE 05/14/2014 2035   Sepsis Labs: @LABRCNTIP (procalcitonin:4,lacticidven:4) )No results found for this or any previous visit (from the past 240 hour(s)).   Radiological Exams on Admission: Dg Chest 1 View  Result Date: 05/06/2018 CLINICAL DATA:  Heroin overdose EXAM: CHEST  1 VIEW COMPARISON:  September 23, 2017 FINDINGS: There is airspace consolidation throughout most of the right lung. Left  lung is clear. Heart size and pulmonary vascularity are normal. No adenopathy. No bone lesions. No pneumothorax. IMPRESSION: Widespread airspace consolidation throughout much of the right lung. Suspect multifocal pneumonia versus aspiration. Atypical distribution of pulmonary edema could be contributory. Left lung clear. No adenopathy evident. Electronically Signed   By: Bretta Bang III M.D.   On: 05/06/2018 20:09   Ct Head Wo Contrast  Result Date: 05/06/2018 CLINICAL DATA:  Heroin overdose with altered mental status EXAM: CT HEAD WITHOUT CONTRAST TECHNIQUE: Contiguous axial images were obtained from the base of the skull through the vertex without intravenous contrast. COMPARISON:  None. FINDINGS: Brain: The ventricles are normal in size and configuration. There is no evident intracranial mass, hemorrhage, extra-axial fluid collection, or midline shift. Gray-white compartments appear normal. No acute infarct evident. Vascular: No hyperdense vessel. No appreciable vascular calcification evident. Skull: Bony calvarium appears intact. Sinuses/Orbits: There  is opacification in inferior right maxillary antrum. There is mucosal thickening in several ethmoid air cells. Orbits appear symmetric bilaterally. Other: Mastoid air cells are clear. IMPRESSION: Areas of paranasal sinus disease.  Study otherwise unremarkable. Electronically Signed   By: Bretta Bang III M.D.   On: 05/06/2018 21:17    EKG: Independently reviewed.  Sinus tachycardia.  Assessment/Plan Principal Problem:   Sepsis (HCC) Active Problems:   Aspiration pneumonia (HCC)   Acute respiratory failure with hypoxia (HCC)   ARF (acute renal failure) (HCC)   Hyperglycemia   RUE weakness   Rhabdomyolysis    1. Sepsis likely from aspiration pneumonia -check blood cultures procalcitonin continue with aggressive hydration and patient is placed on empiric antibiotics.  Since patient blood pressure tend to remain low despite 4 L of fluid  I have discussed with pulmonary critical care and started patient on Neo-Synephrine.  Critical care to consult. 2. Acute encephalopathy likely from drug overdose -closely observe. 3. Right upper extremity weakness -discussed with neurology Dr. Otelia Limes at this time Dr. Otelia Limes neurologist on-call feels that patient symptoms may be secondary to rhabdomyolysis and lying on that side.  At this time neurology has recommended MRI brain and if positive to reconsult neurologist. 4. Rhabdomyolysis -continue with aggressive hydration follow CK levels. 5. Acute renal failure could be prerenal continue with hydration and follow metabolic panel intake output.  Follow urine analysis.  Check FENa. 6. Hyperglycemia check hemoglobin A1c.   DVT prophylaxis: Lovenox. Code Status: Full code. Family Communication: No family at the bedside. Disposition Plan: To be determined. Consults called: Pulmonary critical care. Admission status: Inpatient.   Eduard Clos MD [facility] Pager [phone].  If 7PM-7AM, please contact night-coverage [email or web address] Password [facility]  05/07/2018, 12:21 AM

## 2018-05-07 NOTE — Progress Notes (Signed)
.. ..  Name: Gerald Oliver MRN: [id number] DOB: Jun 05, 1991    ADMISSION DATE:  05/06/2018  CONSULTATION DATE:  05/07/2018  REFERRING MD :  Toniann Fail MD  CHIEF COMPLAINT:  Overdose   BRIEF PATIENT DESCRIPTION: 27 yr old male with h/o polysubstance abuse presented to [facility] unresponsive and cyanotic, received 4mg  intranasal Narcan and O2 sat improved ro 100%with supp O2 delivered by [facility].  PCCM consulted due to low BP after 3L NS IVF.   SIGNIFICANT EVENTS  overdose  STUDIES:  CTH w/o contrast : Areas of paranasal sinus disease.  Study otherwise unremarkable CXR: Widespread airspace consolidation throughout much of the right lung. Suspect multifocal pneumonia versus aspiration. Atypical distribution of pulmonary edema could be contributory. Left lung clear. No adenopathy evident. ECHO 7/22>>> abd [place] 7/22>>> MRI brain 7/22>>>  Cultures bcx2 7/22>>>  abx Zosyn 7/22>>>  SUBJECTIVE/INTERVAL:  7/22: He continues to pain of right arm and right leg numbness.  Otherwise not in any distress.  VITAL SIGNS: Temp:  [99.4 F (37.4 C)-100.1 F (37.8 C)] 100.1 F (37.8 C) (07/22 0800) Pulse Rate:  [44-134] 90 (07/22 1000) Resp:  [14-40] 26 (07/22 1000) BP: (76-131)/(44-80) 131/65 (07/22 1000) SpO2:  [64 %-100 %] 100 % (07/22 1000) Weight:  [147 lb 7.8 oz (66.9 kg)] 147 lb 7.8 oz (66.9 kg) (07/22 0003)  PHYSICAL EXAMINATION: General: 27 year old white male currently resting comfortably in bed no acute distress HEENT normocephalic atraumatic no jugular venous distention Pulmonary: Clear to auscultation diminished in the right base Cardiac: Regular rate and rhythm T waves elevated Abdomen: Soft nontender no organomegaly Extremities: Warm and dry no significant edema some redness over right hip no pain to palpation just reports numbness, no swelling Neuro:.  Awake oriented no focal deficits no right arm and lower extremity  Recent Labs  Lab 05/06/18 1942 05/07/18 0147  05/07/18 0403  NA 142 136 141  K 5.7* 6.3* 5.7*  CL 102 108 112*  CO2 20* 20* 22  BUN 17 17 18   CREATININE 2.16* 1.41* 1.44*  GLUCOSE 226* 103* 112*   Recent Labs  Lab 05/06/18 1942 05/07/18 0147  HGB 17.5* 14.9  HCT 50.9 43.2  WBC 23.6* 27.2*  PLT 290 245   Dg Chest 1 View  Result Date: 05/06/2018 CLINICAL DATA:  Heroin overdose EXAM: CHEST  1 VIEW COMPARISON:  September 23, 2017 FINDINGS: There is airspace consolidation throughout most of the right lung. Left lung is clear. Heart size and pulmonary vascularity are normal. No adenopathy. No bone lesions. No pneumothorax. IMPRESSION: Widespread airspace consolidation throughout much of the right lung. Suspect multifocal pneumonia versus aspiration. Atypical distribution of pulmonary edema could be contributory. Left lung clear. No adenopathy evident. Electronically Signed   By: Bretta Bang III M.D.   On: 05/06/2018 20:09   Ct Head Wo Contrast  Result Date: 05/06/2018 CLINICAL DATA:  Heroin overdose with altered mental status EXAM: CT HEAD WITHOUT CONTRAST TECHNIQUE: Contiguous axial images were obtained from the base of the skull through the vertex without intravenous contrast. COMPARISON:  None. FINDINGS: Brain: The ventricles are normal in size and configuration. There is no evident intracranial mass, hemorrhage, extra-axial fluid collection, or midline shift. Gray-white compartments appear normal. No acute infarct evident. Vascular: No hyperdense vessel. No appreciable vascular calcification evident. Skull: Bony calvarium appears intact. Sinuses/Orbits: There is opacification in inferior right maxillary antrum. There is mucosal thickening in several ethmoid air cells. Orbits appear symmetric bilaterally. Other: Mastoid air cells are clear. IMPRESSION: Areas of paranasal  sinus disease.  Study otherwise unremarkable. Electronically Signed   By: Bretta BangWilliam  Woodruff III M.D.   On: 05/06/2018 21:17   Ct Chest Wo Contrast  Result Date:  05/07/2018 CLINICAL DATA:  Overdose, unresponsive, hypotensive, and cyanotic. EXAM: CT CHEST WITHOUT CONTRAST TECHNIQUE: Multidetector CT imaging of the chest was performed following the standard protocol without IV contrast. COMPARISON:  Chest radiograph 05/06/2018 FINDINGS: Cardiovascular: Normal heart size. No pericardial effusion. Normal caliber thoracic aorta. Mediastinum/Nodes: Esophagus is decompressed. No significant lymphadenopathy in the chest. No mediastinal gas or fluid collections. Lungs/Pleura: Diffuse airspace consolidation throughout the right lung with mild left perihilar consolidation. Air bronchograms are present. This could be due to pneumonia or aspiration. No pleural effusions. No pneumothorax. Airways are patent. Upper Abdomen: Gallbladder wall is diffusely thickened and edematous. This is nonspecific. Musculoskeletal: No chest wall mass or suspicious bone lesions identified. IMPRESSION: Diffuse airspace consolidation throughout the right lung with mild left perihilar consolidation. This could be due to pneumonia or aspiration. Diffuse thickening of the wall of the gallbladder is nonspecific but could indicate cholecystitis. No stones identified. Electronically Signed   By: Burman NievesWilliam  Stevens M.D.   On: 05/07/2018 06:17    ASSESSMENT / PLAN: reSolved issues:  Anion gap metabolic acidosis   Active issues Altered mental status secondary to metabolic encephalopathy S/p overdose-  Right upper extremity and lower extremity paresthesia. -Suspect numbness of upper and lower extremity due to rhabdomyolysis however this had been reviewed with neurology in the ER who recommended MRI -pt admits to snorting 30 g of OxyContin, however UDS negative for opiates; raises question is this methadone vs synthetic opioid -ETOH <10 -CTH reviewed paranasal sinuses otherwise negative -As of 7/22 AM he is fully awake and oriented Plan MRI brain Continuing supportive care  Probable aspiration  pneumonia with hypoxia CT chest reviewed with widespread consolidated airspace disease in the right lung Oxygen requirements of improved Plan Continue supplemental oxygen and pulse oximetry Day #1 Zosyn, continue Procalcitonin algorithm Repeat chest x-ray in a.m.  Severe sepsis likely secondary to aspiration pneumonia Status post 3 L IV fluids Weaning pressors Eyes IV drug use Plan Continue maintenance IV fluids Ensure mean arterial pressure greater than 65 Continue antibiotics for possible aspiration Follow-up culture data Echocardiogram written earlier  Acute kidney injury, with hyperkalemia and rhabdomyolysis Creatinine increased from 2:00 to 4:00, but only marginal.  Acid-base is been relatively stable.  Potassium improved some, but now has spiked T waves once again raising concern for rising creatinine Plan Continuing IV fluids, will change him to D5 water with 50 mEq of bicarbonate Serial blood chemistries Serial total CKs Strict intake and output, we should leave the Foley catheter in place for now May need to give him Kayexalate  Hyperglycemia Plan Trend CBG continue sliding scale insulin follow-up hemoglobin A1c  Transaminitis Plan Trend LFTs follow-up ultrasound abdomen  DVT prophylaxis: [place] heparin SUP: na  Diet: clears Activity: BR Disposition : ICU/sdu  Simonne MartinetPeter E Justis Dupas ACNP-BC [facility] Pulmonary/Critical Care Pager # [phone] OR # [phone] if no answer   05/07/2018, 10:28 AM

## 2018-05-07 NOTE — Progress Notes (Signed)
Pharmacy Antibiotic Note  Gerald Oliver is a 27 y.o. male admitted on 05/06/2018 as a presumed OD, pt was found by his brother unresponsive and cyanotic.  4mg  of intranasal narcan prior to EMS arrival was given.  Pharmacy has been consulted for vancomycin and zosyn for sepsis.  Plan: -Vancomycin gm IV x 1 in ED then continue 1gm Q24h (AUC 507.9, Scr 2.16) will start in 18 hours in lieu of bolus -Zosyn 3.375gm IV x 1 in ED then 3.375gm q8h extended interval - daily Scr - follow renal function, cultures and clinical course  Height: 5\' 8"  (172.7 cm) Weight: 147 lb 7.8 oz (66.9 kg) IBW/kg (Calculated) : 68.4  Temp (24hrs), Avg:99.5 F (37.5 C), Min:99.5 F (37.5 C), Max:99.5 F (37.5 C)  Recent Labs  Lab 05/06/18 1942 05/06/18 2006 05/06/18 2206  WBC 23.6*  --   --   CREATININE 2.16*  --   --   LATICACIDVEN  --  5.5* 3.0*    Estimated Creatinine Clearance: 48.2 mL/min (A) (by C-G formula based on SCr of 2.16 mg/dL (H)).    No Known Allergies  Antimicrobials this admission: 7/21 vanc >> 7/21 zosyn >> Dose adjustments this admission:   Microbiology results: 7/21 BCx:  7/21  MRSA PCR:   Thank you for allowing pharmacy to be a part of this patient's care.  Arley PhenixEllen Edessa Jakubowicz RPh 05/07/2018, 12:35 AM Pager [phone]

## 2018-05-08 ENCOUNTER — Inpatient Hospital Stay (HOSPITAL_COMMUNITY): Payer: Self-pay

## 2018-05-08 LAB — GLUCOSE, CAPILLARY
Glucose-Capillary: 102 mg/dL — ABNORMAL HIGH (ref 70–99)
Glucose-Capillary: 103 mg/dL — ABNORMAL HIGH (ref 70–99)
Glucose-Capillary: 105 mg/dL — ABNORMAL HIGH (ref 70–99)

## 2018-05-08 LAB — HEPATIC FUNCTION PANEL
ALK PHOS: 58 U/L (ref 38–126)
ALT: 118 U/L — AB (ref 0–44)
AST: 303 U/L — AB (ref 15–41)
Albumin: 3.4 g/dL — ABNORMAL LOW (ref 3.5–5.0)
BILIRUBIN DIRECT: 0.3 mg/dL — AB (ref 0.0–0.2)
BILIRUBIN INDIRECT: 1.9 mg/dL — AB (ref 0.3–0.9)
BILIRUBIN TOTAL: 2.2 mg/dL — AB (ref 0.3–1.2)
Total Protein: 6.4 g/dL — ABNORMAL LOW (ref 6.5–8.1)

## 2018-05-08 MED ORDER — AMOXICILLIN-POT CLAVULANATE 875-125 MG PO TABS
1.0000 | ORAL_TABLET | Freq: Two times a day (BID) | ORAL | Status: DC
  Administered 2018-05-08 – 2018-05-09 (×3): 1 via ORAL
  Filled 2018-05-08 (×4): qty 1

## 2018-05-08 NOTE — Progress Notes (Signed)
PROGRESS NOTE    Gerald Oliver  QMV:[id number] DOB: 1991/06/28 DOA: 05/06/2018 PCP: Patient, No Pcp Per     Brief Narrative:  Gerald Oliver a 27 y.o.malewithhistory of drug abuse admitted in December 2018 with drug overdose was found to be unresponsive by patient's brother at a motel and EMS was called. Per report, patient's brother attempted giving Narcan. EMS on arrival found the patient to be hypoxic and mildly cyanotic. In the ED, he was found to be lethargic, urine drug screen negative. He was given narcan with improvement in alterness. Due to refractory hypotension to IVF, PCCM was consulted and patient was given pressors.   New events last 24 hours / Subjective: Pressors weaned off. Patient continues to have some numbness of his right UE and lower extremities. No complaints of nausea, vomiting, chest pain.   Assessment & Plan:   Principal Problem:   Sepsis (HCC) Active Problems:   Aspiration pneumonia (HCC)   Acute respiratory failure with hypoxia (HCC)   ARF (acute renal failure) (HCC)   Hyperglycemia   RUE weakness   Rhabdomyolysis   Acute metabolic encephalopathy due to opioid overdose -Suspect synthetic opioid as UDS negative, but alertness improved with narcan -Mentation improving   Septic shock secondary to aspiration pneumonia -MRSA negative, stop vanco -Blood cultures pending  -Now off pressors  -Repeat CXR this morning shows extensive patchy consolidation on right  -Zosyn --> Augmentin   Acute hypoxemic respiratory failure -Due to above -Wean O2 as able  AKI -Resolved with IVF   Paresthesia -MRI brain negative, likely due to rhabdomyolysis and laying on right side for unknown period of time -Monitor  Rhabdomyolysis -CK trending down, peaked at 12,125   Elevated troponin -Due to demand ischemia -No chest pain -Trop peak at 0.85 -Echo without regional wall motion abnormalities   Elevated LFT -RUQ US gallbladder sludge without acute  cholecystitis  -Trend LFT, trending up today, could be secondary to shock liver in setting of hypoxia and hypotension   Hyperkalemia -Resolved   Homelessness -SW consulted    DVT prophylaxis: Subq hep  Code Status: Full Family Communication: No family at bedside Disposition Plan: Transfer to med surg today. SW consulted for homelessness. Trend LFT. Likely DC in AM 7/24    Consultants:   PCCM  Procedures:   None   Antimicrobials:  Anti-infectives (From admission, onward)   Start     Dose/Rate Route Frequency Ordered Stop   05/08/18 1200  amoxicillin-clavulanate (AUGMENTIN) 875-125 MG per tablet 1 tablet     1 tablet Oral Every 12 hours 05/08/18 0833     05/08/18 0400  piperacillin-tazobactam (ZOSYN) IVPB 3.375 g  Status:  Discontinued     3.375 g 12.5 mL/hr over 240 Minutes Intravenous Every 8 hours 05/07/18 0044 05/08/18 0833   05/07/18 1400  vancomycin (VANCOCIN) IVPB 1000 mg/200 mL premix  Status:  Discontinued     1,000 mg 200 mL/hr over 60 Minutes Intravenous Every 24 hours 05/07/18 0044 05/07/18 0931   05/06/18 2030  vancomycin (VANCOCIN) IVPB 1000 mg/200 mL premix     1,000 mg 200 mL/hr over 60 Minutes Intravenous STAT 05/06/18 2017 05/06/18 2232   05/06/18 2015  piperacillin-tazobactam (ZOSYN) IVPB 3.375 g     3.375 g 100 mL/hr over 30 Minutes Intravenous  Once 05/06/18 2013 05/06/18 2112        Objective: Vitals:   05/08/18 0500 05/08/18 0700 05/08/18 0714 05/08/18 0800  BP: 112/67 108/79  116/67  Pulse: 64 (!) 52  (!)  52  Resp: (!) 21 19  (!) 22  Temp:   99.1 F (37.3 C)   TempSrc:   Oral   SpO2: 97% 98%  95%  Weight:      Height:        Intake/Output Summary (Last 24 hours) at 05/08/2018 1117 Last data filed at 05/08/2018 0803 Gross per 24 hour  Intake 3570 ml  Output 5500 ml  Net -1930 ml   Filed Weights   05/07/18 0003  Weight: 66.9 kg (147 lb 7.8 oz)    Examination:  General exam: Appears calm and comfortable  Respiratory system:  Clear to auscultation. Respiratory effort normal. Cardiovascular system: S1 & S2 heard, RRR. No JVD, murmurs, rubs, gallops or clicks. No pedal edema. Gastrointestinal system: Abdomen is nondistended, soft and nontender. No organomegaly or masses felt. Normal bowel sounds heard. Central nervous system: Alert and oriented. No focal neurological deficits. Extremities: Symmetric 5 x 5 power. Skin: No rashes, lesions or ulcers Psychiatry: Judgement and insight appear stable   Data Reviewed: I have personally reviewed following labs and imaging studies  CBC: Recent Labs  Lab 05/06/18 1942 05/07/18 0147  WBC 23.6* 27.2*  NEUTROABS 21.4* 25.0*  HGB 17.5* 14.9  HCT 50.9 43.2  MCV 86.6 85.2  PLT 290 245   Basic Metabolic Panel: Recent Labs  Lab 05/07/18 0147 05/07/18 0403 05/07/18 1020 05/07/18 1357 05/07/18 2154  NA 136 141 143 144 142  K 6.3* 5.7* 4.3 4.0 3.8  CL 108 112* 113* 110 106  CO2 20* 22 20* 28 30  GLUCOSE 103* 112* 148* 114* 139*  BUN 17 18 14 13 9   CREATININE 1.41* 1.44* 1.21 1.00 0.95  CALCIUM 7.5* 7.6* 8.2* 8.2* 8.5*  PHOS  --   --   --  2.7 1.7*   GFR: Estimated Creatinine Clearance: 109.5 mL/min (by C-G formula based on SCr of 0.95 mg/dL). Liver Function Tests: Recent Labs  Lab 05/06/18 2004 05/07/18 0147 05/07/18 1357 05/07/18 2154 05/08/18 0837  AST 81* 233*  --   --  303*  ALT 35 73*  --   --  118*  ALKPHOS 112 62  --   --  58  BILITOT 0.8 0.8  --   --  2.2*  PROT 7.7 6.0*  --   --  6.4*  ALBUMIN 3.9 3.1* 2.9* 2.8* 3.4*   No results for input(s): LIPASE, AMYLASE in the last 168 hours. No results for input(s): AMMONIA in the last 168 hours. Coagulation Profile: Recent Labs  Lab 05/07/18 0147  INR 1.33   Cardiac Enzymes: Recent Labs  Lab 05/06/18 2004 05/07/18 0147 05/07/18 0623 05/07/18 1357 05/07/18 2154  CKTOTAL 1,534* 2,760*  --  12,125* 9,098*  TROPONINI 0.42* 0.75* 0.85* 0.76*  --    BNP (last 3 results) No results for  input(s): PROBNP in the last 8760 hours. HbA1C: Recent Labs    05/07/18 0147  HGBA1C 5.1   CBG: Recent Labs  Lab 05/07/18 1532 05/07/18 2000 05/08/18 0012 05/08/18 0353 05/08/18 0729  GLUCAP 74 117* 105* 103* 102*   Lipid Profile: No results for input(s): CHOL, HDL, LDLCALC, TRIG, CHOLHDL, LDLDIRECT in the last 72 hours. Thyroid Function Tests: No results for input(s): TSH, T4TOTAL, FREET4, T3FREE, THYROIDAB in the last 72 hours. Anemia Panel: No results for input(s): VITAMINB12, FOLATE, FERRITIN, TIBC, IRON, RETICCTPCT in the last 72 hours. Sepsis Labs: Recent Labs  Lab 05/06/18 2006 05/06/18 2206 05/07/18 0147  PROCALCITON  --   --  22.15  LATICACIDVEN 5.5* 3.0* 2.5*    Recent Results (from the past 240 hour(s))  MRSA PCR Screening     Status: None   Collection Time: 05/06/18 11:54 PM  Result Value Ref Range Status   MRSA by PCR NEGATIVE NEGATIVE Final    Comment:        The GeneXpert MRSA Assay (FDA approved for NASAL specimens only), is one component of a comprehensive MRSA colonization surveillance program. It is not intended to diagnose MRSA infection nor to guide or monitor treatment for MRSA infections. Performed at [facility], [address] [place], [place] [address]        Radiology Studies: Dg Chest 1 View  Result Date: 05/06/2018 CLINICAL DATA:  Heroin overdose EXAM: CHEST  1 VIEW COMPARISON:  September 23, 2017 FINDINGS: There is airspace consolidation throughout most of the right lung. Left lung is clear. Heart size and pulmonary vascularity are normal. No adenopathy. No bone lesions. No pneumothorax. IMPRESSION: Widespread airspace consolidation throughout much of the right lung. Suspect multifocal pneumonia versus aspiration. Atypical distribution of pulmonary edema could be contributory. Left lung clear. No adenopathy evident. Electronically Signed   By: Bretta BangWilliam  Woodruff III M.D.   On: 05/06/2018 20:09   Ct Head Wo  Contrast  Result Date: 05/06/2018 CLINICAL DATA:  Heroin overdose with altered mental status EXAM: CT HEAD WITHOUT CONTRAST TECHNIQUE: Contiguous axial images were obtained from the base of the skull through the vertex without intravenous contrast. COMPARISON:  None. FINDINGS: Brain: The ventricles are normal in size and configuration. There is no evident intracranial mass, hemorrhage, extra-axial fluid collection, or midline shift. Gray-white compartments appear normal. No acute infarct evident. Vascular: No hyperdense vessel. No appreciable vascular calcification evident. Skull: Bony calvarium appears intact. Sinuses/Orbits: There is opacification in inferior right maxillary antrum. There is mucosal thickening in several ethmoid air cells. Orbits appear symmetric bilaterally. Other: Mastoid air cells are clear. IMPRESSION: Areas of paranasal sinus disease.  Study otherwise unremarkable. Electronically Signed   By: Bretta BangWilliam  Woodruff III M.D.   On: 05/06/2018 21:17   Ct Chest Wo Contrast  Result Date: 05/07/2018 CLINICAL DATA:  Overdose, unresponsive, hypotensive, and cyanotic. EXAM: CT CHEST WITHOUT CONTRAST TECHNIQUE: Multidetector CT imaging of the chest was performed following the standard protocol without IV contrast. COMPARISON:  Chest radiograph 05/06/2018 FINDINGS: Cardiovascular: Normal heart size. No pericardial effusion. Normal caliber thoracic aorta. Mediastinum/Nodes: Esophagus is decompressed. No significant lymphadenopathy in the chest. No mediastinal gas or fluid collections. Lungs/Pleura: Diffuse airspace consolidation throughout the right lung with mild left perihilar consolidation. Air bronchograms are present. This could be due to pneumonia or aspiration. No pleural effusions. No pneumothorax. Airways are patent. Upper Abdomen: Gallbladder wall is diffusely thickened and edematous. This is nonspecific. Musculoskeletal: No chest wall mass or suspicious bone lesions identified. IMPRESSION:  Diffuse airspace consolidation throughout the right lung with mild left perihilar consolidation. This could be due to pneumonia or aspiration. Diffuse thickening of the wall of the gallbladder is nonspecific but could indicate cholecystitis. No stones identified. Electronically Signed   By: Burman NievesWilliam  Stevens M.D.   On: 05/07/2018 06:17   Mr Brain Wo Contrast  Result Date: 05/07/2018 CLINICAL DATA:  Headache and RIGHT leg pain. Possible endocarditis. History of intravenous drug abuse. EXAM: MRI HEAD WITHOUT CONTRAST TECHNIQUE: Multiplanar, multiecho pulse sequences of the brain and surrounding structures were obtained without intravenous contrast. COMPARISON:  CT HEAD May 06, 2018 FINDINGS: INTRACRANIAL CONTENTS: No reduced diffusion to suggest acute ischemia  or atypical infection. No susceptibility artifact to suggest hemorrhage or septic emboli. The ventricles and sulci are normal for patient's age. No suspicious parenchymal signal, masses, mass effect. No abnormal extra-axial fluid collections. No extra-axial masses. VASCULAR: Normal major intracranial vascular flow voids present at skull base. SKULL AND UPPER CERVICAL SPINE: No abnormal sellar expansion. No suspicious calvarial bone marrow signal. Craniocervical junction maintained. SINUSES/ORBITS: Paranasal sinus mucosal thickening with RIGHT maxillary and RIGHT sphenoid sinus air-fluid levels.The included ocular globes and orbital contents are non-suspicious. OTHER: Life-support lines in place. IMPRESSION: Normal noncontrast MRI head. Electronically Signed   By: Awilda Metro M.D.   On: 05/07/2018 20:05   Dg Chest Port 1 View  Result Date: 05/08/2018 CLINICAL DATA:  Pneumonia EXAM: PORTABLE CHEST 1 VIEW COMPARISON:  Chest radiograph May 06, 2018 and chest CT May 07, 2018 FINDINGS: There has been partial but incomplete clearing of consolidation from much of the right lung. There is patchy consolidation remaining throughout much of the right mid and  lower lung zones and to a lesser degree in the right upper lobe. On the left, there is new patchy atelectasis in the left base. Heart is upper normal in size with pulmonary vascularity normal. No adenopathy. No bone lesions. IMPRESSION: There remains extensive patchy consolidation on the right, less than 1 day prior. There is new atelectatic change in the left base. Heart upper normal in size. Electronically Signed   By: Bretta Bang III M.D.   On: 05/08/2018 08:51   US Abdomen Limited Ruq  Result Date: 05/07/2018 CLINICAL DATA:  Right upper quadrant pain EXAM: ULTRASOUND ABDOMEN LIMITED RIGHT UPPER QUADRANT COMPARISON:  None. FINDINGS: Gallbladder: Small amount of sludge in the gallbladder. Negative sonographic Eulah Pont sign reported by the sonographer. Area of focal wall thickening along the superior aspect of the gallbladder, possibly adenomyomatosis. Common bile duct: Diameter: 5 mm Liver: No focal lesion identified. Within normal limits in parenchymal echogenicity. Portal vein is patent on color Doppler imaging with normal direction of blood flow towards the liver. Other: Trace right pleural effusion. IMPRESSION: Gallbladder sludge without other evidence of acute cholecystitis. Electronically Signed   By: Deatra Robinson M.D.   On: 05/07/2018 15:20      Scheduled Meds: . amoxicillin-clavulanate  1 tablet Oral Q12H  . heparin injection (subcutaneous)  5,000 Units Subcutaneous Q8H   Continuous Infusions: . albuterol 10 mg/hr (05/07/18 0415)     LOS: 2 days    Time spent: 25 minutes   Noralee Stain, DO [facility] [email or web address] Password [email or web address] 05/08/2018, 11:17 AM

## 2018-05-08 NOTE — Progress Notes (Signed)
PT Cancellation Note  Patient Details Name: Gerald Oliver MRN: [id number] DOBLewis Oliver: November 05, 1990   Cancelled Treatment:    Reason Eval/Treat Not Completed: Other (comment)patient just back into bed after ambulating to BR, RN reorts that patient required no assistance. PT will check back , most likely will not need PT.    [name], Gerald Oliver 05/08/2018, 4:16 PM  Blanchard KelchKaren Voshon Petro PT [phone]

## 2018-05-08 NOTE — Evaluation (Signed)
Physical Therapy Evaluation Patient Details Name: Gerald Oliver MRN: [id number] DOB: 07-Oct-1991 Today's Date: 05/08/2018   History of Present Illness  27 yo admitted with unresponsiveness/cyanotic, received Narcan , presented with rhabdo , CT chest reviewed with widespread consolidated airspace disease in the right lung, MRI of brain  negative.  Clinical Impression  The patient mobilized to recliner using RW. The patient was able to bear weight on the left leg, hold the RW. Will ambulate after patient has breakfast. Patient reports homelessness.Pt admitted with above diagnosis. Pt currently with functional limitations due to the deficits listed below (see PT Problem List). Pt will benefit from skilled PT to increase their independence and safety with mobility to allow discharge to the venue listed below.       Follow Up Recommendations No PT follow up    Equipment Recommendations  None recommended by PT    Recommendations for Other Services       Precautions / Restrictions Precautions Precautions: Fall Restrictions Weight Bearing Restrictions: No      Mobility  Bed Mobility Overal bed mobility: Needs Assistance Bed Mobility: Supine to Sit     Supine to sit: Supervision     General bed mobility comments: no assist, much encouragement to mobilize  Transfers Overall transfer level: Needs assistance Equipment used: Rolling walker (2 wheeled) Transfers: Sit to/from Stand Sit to Stand: Min guard         General transfer comment: assist to rise  from bed, able to take steps to recliner  Ambulation/Gait             General Gait Details: tba after patient eats.  Stairs            Wheelchair Mobility    Modified Rankin (Stroke Patients Only)       Balance Overall balance assessment: Needs assistance Sitting-balance support: Feet supported;Bilateral upper extremity supported Sitting balance-Leahy Scale: Fair     Standing balance support: During  functional activity;Bilateral upper extremity supported Standing balance-Leahy Scale: Fair                               Pertinent Vitals/Pain Pain Assessment: Faces Faces Pain Scale: Hurts even more Pain Location: right hip Pain Descriptors / Indicators: Discomfort;Grimacing Pain Intervention(s): Limited activity within patient's tolerance;Monitored during session    Home Living Family/patient expects to be discharged to:: Shelter/Homeless                 Additional Comments: patient states that his brother is also homeless,     Prior Function Level of Independence: Independent               Hand Dominance        Extremity/Trunk Assessment   Upper Extremity Assessment Upper Extremity Assessment: Overall WFL for tasks assessed    Lower Extremity Assessment Lower Extremity Assessment: RLE deficits/detail RLE Deficits / Details: able to flex  the knee and hip, placed weight on the leg, patient did not state any numbness was present.    Cervical / Trunk Assessment Cervical / Trunk Assessment: Normal  Communication   Communication: No difficulties  Cognition Arousal/Alertness: Awake/alert Behavior During Therapy: Flat affect;WFL for tasks assessed/performed Overall Cognitive Status: Within Functional Limits for tasks assessed  General Comments      Exercises     Assessment/Plan    PT Assessment Patient needs continued PT services  PT Problem List Decreased strength;Decreased activity tolerance;Decreased mobility;Decreased knowledge of precautions;Decreased safety awareness;Decreased knowledge of use of DME;Decreased range of motion;Pain       PT Treatment Interventions DME instruction;Gait training;Functional mobility training;Therapeutic activities;Patient/family education    PT Goals (Current goals can be found in the Care Plan section)  Acute Rehab PT Goals Patient Stated Goal:  none stated other than to eat. PT Goal Formulation: With patient Time For Goal Achievement: 05/15/18 Potential to Achieve Goals: Good    Frequency Min 3X/week   Barriers to discharge Inaccessible home environment      Co-evaluation               AM-PAC PT "6 Clicks" Daily Activity  Outcome Measure Difficulty turning over in bed (including adjusting bedclothes, sheets and blankets)?: A Little Difficulty moving from lying on back to sitting on the side of the bed? : A Little Difficulty sitting down on and standing up from a chair with arms (e.g., wheelchair, bedside commode, etc,.)?: A Lot Help needed moving to and from a bed to chair (including a wheelchair)?: A Lot Help needed walking in hospital room?: A Lot Help needed climbing 3-5 steps with a railing? : Total 6 Click Score: 13    End of Session Equipment Utilized During Treatment: Gait belt Activity Tolerance: Patient tolerated treatment well Patient left: in chair;with call bell/phone within reach;with chair alarm set Nurse Communication: Mobility status PT Visit Diagnosis: Unsteadiness on feet (R26.81)    Time: 1610-9604 PT Time Calculation (min) (ACUTE ONLY): 16 min   Charges:   PT Evaluation $PT Eval Low Complexity: 1 Low     PT G CodesBlanchard Kelch PT [phone]   Rada Hay 05/08/2018, 9:54 AM

## 2018-05-08 NOTE — Progress Notes (Addendum)
.. ..  Name: Gerald Oliver MRN: [id number] DOB: 1991-10-10    ADMISSION DATE:  05/06/2018  CONSULTATION DATE:  05/07/2018  REFERRING MD :  Toniann Fail MD  CHIEF COMPLAINT:  Overdose   BRIEF PATIENT DESCRIPTION: 27 yr old male with h/o polysubstance abuse presented to [facility] unresponsive and cyanotic, received 4mg  intranasal Narcan and O2 sat improved ro 100%with supp O2 delivered by [facility].  PCCM consulted due to low BP after 3L NS IVF.   SIGNIFICANT EVENTS  overdose  STUDIES:  CTH w/o contrast : Areas of paranasal sinus disease.  Study otherwise unremarkable CXR: Widespread airspace consolidation throughout much of the right lung. Suspect multifocal pneumonia versus aspiration. Atypical distribution of pulmonary edema could be contributory. Left lung clear. No adenopathy evident. ECHO 7/22>>> abd [place] 7/22>>> MRI brain 7/22>>>  Cultures bcx2 7/22>>>  abx Zosyn 7/22>>>  SUBJECTIVE/INTERVAL:  7/22: He continues to pain of right arm and right leg numbness.  Otherwise not in any distress. 7/23: Improved sensation, feels "a little better" renal function, potassium, total CKs all heading the right direction.  Was weaned off vasoactive drips on the 22nd  VITAL SIGNS: Temp:  [98 F (36.7 C)-99.6 F (37.6 C)] 99.1 F (37.3 C) (07/23 0714) Pulse Rate:  [52-90] 52 (07/23 0800) Resp:  [16-33] 22 (07/23 0800) BP: (101-146)/(47-79) 116/67 (07/23 0800) SpO2:  [93 %-100 %] 95 % (07/23 0800) 2 L  Intake/Output Summary (Last 24 hours) at 05/08/2018 4098 Last data filed at 05/08/2018 0800 Gross per 24 hour  Intake 3690 ml  Output 6850 ml  Net -3160 ml    PHYSICAL EXAMINATION: General: This 27 year old white male currently lying in bed he is in no acute distress HEENT normocephalic atraumatic no jugular venous distention Pulmonary: Diminished on the right, otherwise clear.  No accessory use Cardiac: Regular rate and rhythm without murmur rub or gallop Abdomen: Soft nontender no  organomegaly Neuro: Awake, alert, has improved sensation on the right. GU: Clear yellow Musculoskeletal: Equal strength and bulk.  Some pain to palpation of right hip  Recent Labs  Lab 05/07/18 1020 05/07/18 1357 05/07/18 2154  NA 143 144 142  K 4.3 4.0 3.8  CL 113* 110 106  CO2 20* 28 30  BUN 14 13 9   CREATININE 1.21 1.00 0.95  GLUCOSE 148* 114* 139*   Recent Labs  Lab 05/06/18 1942 05/07/18 0147  HGB 17.5* 14.9  HCT 50.9 43.2  WBC 23.6* 27.2*  PLT 290 245   Dg Chest 1 View  Result Date: 05/06/2018 CLINICAL DATA:  Heroin overdose EXAM: CHEST  1 VIEW COMPARISON:  September 23, 2017 FINDINGS: There is airspace consolidation throughout most of the right lung. Left lung is clear. Heart size and pulmonary vascularity are normal. No adenopathy. No bone lesions. No pneumothorax. IMPRESSION: Widespread airspace consolidation throughout much of the right lung. Suspect multifocal pneumonia versus aspiration. Atypical distribution of pulmonary edema could be contributory. Left lung clear. No adenopathy evident. Electronically Signed   By: Bretta Bang III M.D.   On: 05/06/2018 20:09   Ct Head Wo Contrast  Result Date: 05/06/2018 CLINICAL DATA:  Heroin overdose with altered mental status EXAM: CT HEAD WITHOUT CONTRAST TECHNIQUE: Contiguous axial images were obtained from the base of the skull through the vertex without intravenous contrast. COMPARISON:  None. FINDINGS: Brain: The ventricles are normal in size and configuration. There is no evident intracranial mass, hemorrhage, extra-axial fluid collection, or midline shift. Gray-white compartments appear normal. No acute infarct evident. Vascular: No hyperdense  vessel. No appreciable vascular calcification evident. Skull: Bony calvarium appears intact. Sinuses/Orbits: There is opacification in inferior right maxillary antrum. There is mucosal thickening in several ethmoid air cells. Orbits appear symmetric bilaterally. Other: Mastoid air  cells are clear. IMPRESSION: Areas of paranasal sinus disease.  Study otherwise unremarkable. Electronically Signed   By: Bretta Bang III M.D.   On: 05/06/2018 21:17   Ct Chest Wo Contrast  Result Date: 05/07/2018 CLINICAL DATA:  Overdose, unresponsive, hypotensive, and cyanotic. EXAM: CT CHEST WITHOUT CONTRAST TECHNIQUE: Multidetector CT imaging of the chest was performed following the standard protocol without IV contrast. COMPARISON:  Chest radiograph 05/06/2018 FINDINGS: Cardiovascular: Normal heart size. No pericardial effusion. Normal caliber thoracic aorta. Mediastinum/Nodes: Esophagus is decompressed. No significant lymphadenopathy in the chest. No mediastinal gas or fluid collections. Lungs/Pleura: Diffuse airspace consolidation throughout the right lung with mild left perihilar consolidation. Air bronchograms are present. This could be due to pneumonia or aspiration. No pleural effusions. No pneumothorax. Airways are patent. Upper Abdomen: Gallbladder wall is diffusely thickened and edematous. This is nonspecific. Musculoskeletal: No chest wall mass or suspicious bone lesions identified. IMPRESSION: Diffuse airspace consolidation throughout the right lung with mild left perihilar consolidation. This could be due to pneumonia or aspiration. Diffuse thickening of the wall of the gallbladder is nonspecific but could indicate cholecystitis. No stones identified. Electronically Signed   By: Burman Nieves M.D.   On: 05/07/2018 06:17   Mr Brain Wo Contrast  Result Date: 05/07/2018 CLINICAL DATA:  Headache and RIGHT leg pain. Possible endocarditis. History of intravenous drug abuse. EXAM: MRI HEAD WITHOUT CONTRAST TECHNIQUE: Multiplanar, multiecho pulse sequences of the brain and surrounding structures were obtained without intravenous contrast. COMPARISON:  CT HEAD May 06, 2018 FINDINGS: INTRACRANIAL CONTENTS: No reduced diffusion to suggest acute ischemia or atypical infection. No  susceptibility artifact to suggest hemorrhage or septic emboli. The ventricles and sulci are normal for patient's age. No suspicious parenchymal signal, masses, mass effect. No abnormal extra-axial fluid collections. No extra-axial masses. VASCULAR: Normal major intracranial vascular flow voids present at skull base. SKULL AND UPPER CERVICAL SPINE: No abnormal sellar expansion. No suspicious calvarial bone marrow signal. Craniocervical junction maintained. SINUSES/ORBITS: Paranasal sinus mucosal thickening with RIGHT maxillary and RIGHT sphenoid sinus air-fluid levels.The included ocular globes and orbital contents are non-suspicious. OTHER: Life-support lines in place. IMPRESSION: Normal noncontrast MRI head. Electronically Signed   By: Awilda Metro M.D.   On: 05/07/2018 20:05   US Abdomen Limited Ruq  Result Date: 05/07/2018 CLINICAL DATA:  Right upper quadrant pain EXAM: ULTRASOUND ABDOMEN LIMITED RIGHT UPPER QUADRANT COMPARISON:  None. FINDINGS: Gallbladder: Small amount of sludge in the gallbladder. Negative sonographic Eulah Pont sign reported by the sonographer. Area of focal wall thickening along the superior aspect of the gallbladder, possibly adenomyomatosis. Common bile duct: Diameter: 5 mm Liver: No focal lesion identified. Within normal limits in parenchymal echogenicity. Portal vein is patent on color Doppler imaging with normal direction of blood flow towards the liver. Other: Trace right pleural effusion. IMPRESSION: Gallbladder sludge without other evidence of acute cholecystitis. Electronically Signed   By: Deatra Robinson M.D.   On: 05/07/2018 15:20    ASSESSMENT / PLAN: reSolved issues:  Anion gap metabolic acidosis Septic shock Acute kidney injury Hyperkalemia    Active issues Altered mental status secondary to metabolic encephalopathy S/p overdose-  Right upper extremity and lower extremity paresthesia. -Suspect numbness of upper and lower extremity due to rhabdomyolysis  however this had been reviewed with neurology in the  ER who recommended MRI -pt admits to snorting 30 g of OxyContin, however UDS negative for opiates; raises question is this methadone vs synthetic opioid -ETOH <10 -CTH reviewed paranasal sinuses otherwise negative -As of 7/22 AM he is fully awake and oriented -Anesthesia improving on 7/23 Plan Continuing supportive care  Probable aspiration pneumonia with hypoxia CT chest reviewed with widespread consolidated airspace disease in the right lung Oxygen requirements of improved Plan Follow-up chest x-ray this a.m.  Wean oxygen, check ambulatory pulse oximetry Day #2 Zosyn, will transition to Augmentin would complete 7 to 10 days   Severe sepsis likely secondary to aspiration pneumonia Now hemodynamically stable.  Pressors discontinued yesterday. Plan Keep euvolemic Antibiotics per above  Acute kidney injury, with hyperkalemia and rhabdomyolysis Total CK is trending down Potassium normalized Excellent urine output Creatinine normalized Plan Agree with discontinuing bicarbonate infusion Follow-up chemistry Intake output DC Foley  Hyperglycemia Hemoglobin A1c was normal Plan Sensitive scale sliding scale insulin  Transaminitis Abdominal ultrasound demonstrated gallbladder sludge but no evidence of acute cholecystitis Plan Follow-up pending LFTs  DVT prophylaxis: [place] heparin SUP: na  Diet: clears, advance regular Activity: BR Disposition : ICU/sdu; transfer to MedSurg.  Critical care will sign off.  Simonne MartinetPeter E Earsel Shouse ACNP-BC [facility] Pager # [phone] OR # [phone] if no answer   05/08/2018, 8:24 AM

## 2018-05-08 NOTE — Progress Notes (Signed)
Report called to receiving Rn [id number]. Patient with no complaints at the current time.

## 2018-05-09 DIAGNOSIS — R945 Abnormal results of liver function studies: Secondary | ICD-10-CM

## 2018-05-09 DIAGNOSIS — R4182 Altered mental status, unspecified: Secondary | ICD-10-CM

## 2018-05-09 DIAGNOSIS — R29898 Other symptoms and signs involving the musculoskeletal system: Secondary | ICD-10-CM

## 2018-05-09 DIAGNOSIS — M6282 Rhabdomyolysis: Secondary | ICD-10-CM

## 2018-05-09 LAB — HEPATIC FUNCTION PANEL
ALK PHOS: 67 U/L (ref 38–126)
ALT: 119 U/L — AB (ref 0–44)
AST: 221 U/L — AB (ref 15–41)
Albumin: 3.7 g/dL (ref 3.5–5.0)
BILIRUBIN INDIRECT: 1.5 mg/dL — AB (ref 0.3–0.9)
Bilirubin, Direct: 0.2 mg/dL (ref 0.0–0.2)
Total Bilirubin: 1.7 mg/dL — ABNORMAL HIGH (ref 0.3–1.2)
Total Protein: 7.5 g/dL (ref 6.5–8.1)

## 2018-05-09 LAB — BASIC METABOLIC PANEL
Anion gap: 7 (ref 5–15)
BUN: 14 mg/dL (ref 6–20)
CO2: 27 mmol/L (ref 22–32)
Calcium: 9.5 mg/dL (ref 8.9–10.3)
Chloride: 107 mmol/L (ref 98–111)
Creatinine, Ser: 0.92 mg/dL (ref 0.61–1.24)
GFR calc Af Amer: >60 mL/min (ref >60)
GLUCOSE: 121 mg/dL — AB (ref 70–99)
POTASSIUM: 4.2 mmol/L (ref 3.5–5.1)
Sodium: 141 mmol/L (ref 135–145)

## 2018-05-09 LAB — CBC
HEMATOCRIT: 43.8 % (ref 39.0–52.0)
Hemoglobin: 15.1 g/dL (ref 13.0–17.0)
MCH: 28.9 pg (ref 26.0–34.0)
MCHC: 34.5 g/dL (ref 30.0–36.0)
MCV: 83.9 fL (ref 78.0–100.0)
PLATELETS: 208 10*3/uL (ref 150–400)
RBC: 5.22 MIL/uL (ref 4.22–5.81)
RDW: 13 % (ref 11.5–15.5)
WBC: 8.2 10*3/uL (ref 4.0–10.5)

## 2018-05-09 MED ORDER — AMOXICILLIN-POT CLAVULANATE 875-125 MG PO TABS: 1.0000 | ORAL_TABLET | Freq: Two times a day (BID) | ORAL | 0 refills | Status: AC

## 2018-05-09 NOTE — Discharge Summary (Signed)
Discharge Summary  Gerald Oliver:[id number] DOB: [birth year]  PCP: Patient, No Pcp Per  Admit date: 05/06/2018 Discharge date: 05/09/2018   Time spent: < 25 minutes  Admitted From: Home Disposition: Home  Recommendations for Outpatient Follow-up:  1. Augmentin for 5 days, end date 7/29     Discharge Diagnoses:  Active Hospital Problems   Diagnosis Date Noted  . Sepsis (HCC) 05/07/2018  . ARF (acute renal failure) (HCC) 05/07/2018  . Hyperglycemia 05/07/2018  . RUE weakness 05/07/2018  . Rhabdomyolysis 05/07/2018  . Acute respiratory failure with hypoxia (HCC) 05/06/2018  . Aspiration pneumonia (HCC) 05/14/2014    Resolved Hospital Problems  No resolved problems to display.    Discharge Condition: Stable  CODE STATUS: Full code Diet recommendation: Regular  Vitals:   05/08/18 1418 05/08/18 2035  BP: 125/70 128/77  Pulse: 63 60  Resp: 18   Temp: 98.3 F (36.8 C) 98 F (36.7 C)  SpO2: 99% 99%    History of present illness:  Gerald Oliver is a 27 y.o. year old male with medical history significant for use previously admitted December 2018 for drug overdose who presented on 05/06/2018 being found unresponsive by his brother at a motel and EMS was called.  Per ED physician patient's mother attempted to give Narcan prior to EMS arrival.  Upon EMS arrival patient was found to be hypoxic mildly cyanotic and placed on nonrebreather 100%.  Remaining hospital course addressed in problem based format below:   Hospital Course:   Acute metabolic encephalopathy, resolved.  In the setting of likely opioid overdose and sepsis from aspiration pneumonia.  Suspect likely synthetic opioids as patient admitted to snorting 30 g of OxyContin; however UDS was negative for opioids.  Ethanol <10.    Septic shock secondary to aspiration pneumonia. upon admission patient was initially refractory to IV fluids to maintain normal blood pressure briefly required pressors (discontinued  7/23)   Severe rhabdomyolysis with some mild right-sided weakness.  ED physician discussed with neurology on day of admission weakness presumed to be related to rhabdomyolysis given patient was down for unknown period of time and significantly elevated CK.  12,000.  MRI showed no acute findings.  CK improved with IV fluids.  Encourage patient to continue hydration, PT recommended no follow-up  Acute renal failure, likely prerenal in setting of septic shock.  Resolved with IV fluids.  Paresthesias, improving.  As mentioned above likely due to severe rhabdomyolysis.  Elevated LFTs, improving.  Some element of shock liver related to septic shock as mentioned above.  Right upper quadrant ultrasound during admission showed small about of gallbladder sludge without acute cholecystitis.  No current abdominal pain.  Elevated troponin, suspect demand ischemia.  Related to septic shock and drug overdose.  Denies any chest pain.  Peak troponin of 0.85 now downtrending.  TTE without any regional wall motion normalities.  EKG showed no acute ischemic abnormalities.  Did show concern for diffuse ST elevations concerning for acute pericarditis, EKG was repeated on 7/24 after discussion with on-call cardiologist (Dr. Mayford Knife).findings are more consistent with early repolarization given normal TTE and patient asymptomatic.  Additionally patient's prior EKG on 03/2018 showed similar findings.   Consultations:  PCCM  Procedures/Studies: TTE, 7/22Normal systolic function, EF 55 to 60%, normal wall motion, normal left ventricular diastolic function  Discharge Exam: BP 128/77 (BP Location: Left Arm)   Pulse 60   Temp 98 F (36.7 C) (Oral)   Resp 18   Ht 5\' 8"  (1.727 m)  Wt 66.9 kg (147 lb 7.8 oz)   SpO2 99%   BMI 22.43 kg/m    General: Lying in bed, no apparent distress Eyes: EOMI, anicteric ENT: Oral Mucosa clear and moist Cardiovascular: regular rate and rhythm, no murmurs, rubs or gallops, no  edema, Respiratory: Normal respiratory effort, lungs clear to auscultation bilaterally Abdomen: soft, non-distended, non-tender, normal bowel sounds Skin: Mild nontender on lateral right lower leg Neurologic: Grossly no focal neuro deficit.Mental status AAOx3, speech normal, Psychiatric:Appropriate affect, and mood   Discharge Instructions You were cared for by a hospitalist during your hospital stay. If you have any questions about your discharge medications or the care you received while you were in the hospital after you are discharged, you can call the unit and asked to speak with the hospitalist on call if the hospitalist that took care of you is not available. Once you are discharged, your primary care physician will handle any further medical issues. Please note that NO REFILLS for any discharge medications will be authorized once you are discharged, as it is imperative that you return to your primary care physician (or establish a relationship with a primary care physician if you do not have one) for your aftercare needs so that they can reassess your need for medications and monitor your lab values.  Discharge Instructions    Diet - low sodium heart healthy   Complete by:  As directed    Increase activity slowly   Complete by:  As directed      Allergies as of 05/09/2018   No Known Allergies     Medication List    TAKE these medications   amoxicillin-clavulanate 875-125 MG tablet Commonly known as:  AUGMENTIN Take 1 tablet by mouth every 12 (twelve) hours for 9 doses.      No Known Allergies    The results of significant diagnostics from this hospitalization (including imaging, microbiology, ancillary and laboratory) are listed below for reference.    Significant Diagnostic Studies: Dg Chest 1 View  Result Date: 05/06/2018 CLINICAL DATA:  Heroin overdose EXAM: CHEST  1 VIEW COMPARISON:  September 23, 2017 FINDINGS: There is airspace consolidation throughout most of the  right lung. Left lung is clear. Heart size and pulmonary vascularity are normal. No adenopathy. No bone lesions. No pneumothorax. IMPRESSION: Widespread airspace consolidation throughout much of the right lung. Suspect multifocal pneumonia versus aspiration. Atypical distribution of pulmonary edema could be contributory. Left lung clear. No adenopathy evident. Electronically Signed   By: Bretta Bang III M.D.   On: 05/06/2018 20:09   Ct Head Wo Contrast  Result Date: 05/06/2018 CLINICAL DATA:  Heroin overdose with altered mental status EXAM: CT HEAD WITHOUT CONTRAST TECHNIQUE: Contiguous axial images were obtained from the base of the skull through the vertex without intravenous contrast. COMPARISON:  None. FINDINGS: Brain: The ventricles are normal in size and configuration. There is no evident intracranial mass, hemorrhage, extra-axial fluid collection, or midline shift. Gray-white compartments appear normal. No acute infarct evident. Vascular: No hyperdense vessel. No appreciable vascular calcification evident. Skull: Bony calvarium appears intact. Sinuses/Orbits: There is opacification in inferior right maxillary antrum. There is mucosal thickening in several ethmoid air cells. Orbits appear symmetric bilaterally. Other: Mastoid air cells are clear. IMPRESSION: Areas of paranasal sinus disease.  Study otherwise unremarkable. Electronically Signed   By: Bretta Bang III M.D.   On: 05/06/2018 21:17   Ct Chest Wo Contrast  Result Date: 05/07/2018 CLINICAL DATA:  Overdose, unresponsive, hypotensive, and cyanotic.  EXAM: CT CHEST WITHOUT CONTRAST TECHNIQUE: Multidetector CT imaging of the chest was performed following the standard protocol without IV contrast. COMPARISON:  Chest radiograph 05/06/2018 FINDINGS: Cardiovascular: Normal heart size. No pericardial effusion. Normal caliber thoracic aorta. Mediastinum/Nodes: Esophagus is decompressed. No significant lymphadenopathy in the chest. No  mediastinal gas or fluid collections. Lungs/Pleura: Diffuse airspace consolidation throughout the right lung with mild left perihilar consolidation. Air bronchograms are present. This could be due to pneumonia or aspiration. No pleural effusions. No pneumothorax. Airways are patent. Upper Abdomen: Gallbladder wall is diffusely thickened and edematous. This is nonspecific. Musculoskeletal: No chest wall mass or suspicious bone lesions identified. IMPRESSION: Diffuse airspace consolidation throughout the right lung with mild left perihilar consolidation. This could be due to pneumonia or aspiration. Diffuse thickening of the wall of the gallbladder is nonspecific but could indicate cholecystitis. No stones identified. Electronically Signed   By: Burman NievesWilliam  Stevens M.D.   On: 05/07/2018 06:17   Mr Brain Wo Contrast  Result Date: 05/07/2018 CLINICAL DATA:  Headache and RIGHT leg pain. Possible endocarditis. History of intravenous drug abuse. EXAM: MRI HEAD WITHOUT CONTRAST TECHNIQUE: Multiplanar, multiecho pulse sequences of the brain and surrounding structures were obtained without intravenous contrast. COMPARISON:  CT HEAD May 06, 2018 FINDINGS: INTRACRANIAL CONTENTS: No reduced diffusion to suggest acute ischemia or atypical infection. No susceptibility artifact to suggest hemorrhage or septic emboli. The ventricles and sulci are normal for patient's age. No suspicious parenchymal signal, masses, mass effect. No abnormal extra-axial fluid collections. No extra-axial masses. VASCULAR: Normal major intracranial vascular flow voids present at skull base. SKULL AND UPPER CERVICAL SPINE: No abnormal sellar expansion. No suspicious calvarial bone marrow signal. Craniocervical junction maintained. SINUSES/ORBITS: Paranasal sinus mucosal thickening with RIGHT maxillary and RIGHT sphenoid sinus air-fluid levels.The included ocular globes and orbital contents are non-suspicious. OTHER: Life-support lines in place.  IMPRESSION: Normal noncontrast MRI head. Electronically Signed   By: Awilda Metroourtnay  Bloomer M.D.   On: 05/07/2018 20:05   Dg Chest Port 1 View  Result Date: 05/08/2018 CLINICAL DATA:  Pneumonia EXAM: PORTABLE CHEST 1 VIEW COMPARISON:  Chest radiograph May 06, 2018 and chest CT May 07, 2018 FINDINGS: There has been partial but incomplete clearing of consolidation from much of the right lung. There is patchy consolidation remaining throughout much of the right mid and lower lung zones and to a lesser degree in the right upper lobe. On the left, there is new patchy atelectasis in the left base. Heart is upper normal in size with pulmonary vascularity normal. No adenopathy. No bone lesions. IMPRESSION: There remains extensive patchy consolidation on the right, less than 1 day prior. There is new atelectatic change in the left base. Heart upper normal in size. Electronically Signed   By: Bretta BangWilliam  Woodruff III M.D.   On: 05/08/2018 08:51   [place] Abdomen Limited Ruq  Result Date: 05/07/2018 CLINICAL DATA:  Right upper quadrant pain EXAM: ULTRASOUND ABDOMEN LIMITED RIGHT UPPER QUADRANT COMPARISON:  None. FINDINGS: Gallbladder: Small amount of sludge in the gallbladder. Negative sonographic Eulah PontMurphy sign reported by the sonographer. Area of focal wall thickening along the superior aspect of the gallbladder, possibly adenomyomatosis. Common bile duct: Diameter: 5 mm Liver: No focal lesion identified. Within normal limits in parenchymal echogenicity. Portal vein is patent on color Doppler imaging with normal direction of blood flow towards the liver. Other: Trace right pleural effusion. IMPRESSION: Gallbladder sludge without other evidence of acute cholecystitis. Electronically Signed   By: Deatra RobinsonKevin  Herman M.D.   On: 05/07/2018  15:20    Microbiology: Recent Results (from the past 240 hour(s))  MRSA PCR Screening     Status: None   Collection Time: 05/06/18 11:54 PM  Result Value Ref Range Status   MRSA by PCR NEGATIVE  NEGATIVE Final    Comment:        The GeneXpert MRSA Assay (FDA approved for NASAL specimens only), is one component of a comprehensive MRSA colonization surveillance program. It is not intended to diagnose MRSA infection nor to guide or monitor treatment for MRSA infections. Performed at [facility], [address], [place], [place] [address]   Culture, blood (routine x 2)     Status: None (Preliminary result)   Collection Time: 05/07/18  1:47 AM  Result Value Ref Range Status   Specimen Description   Final    BLOOD BLOOD LEFT FOREARM Performed at [facility], [address], [place], [place] [address]    Special Requests   Final    BOTTLES DRAWN AEROBIC ONLY Blood Culture adequate volume Performed at [facility], [address] [place], [place] [address]    Culture   Final    NO GROWTH 1 DAY Performed at [facility], [address] [place], [place] [address]    Report Status PENDING  Incomplete  Culture, blood (routine x 2)     Status: None (Preliminary result)   Collection Time: 05/07/18  1:47 AM  Result Value Ref Range Status   Specimen Description   Final    BLOOD BLOOD RIGHT HAND Performed at [facility], [address] [place], [place] [address]    Special Requests   Final    BOTTLES DRAWN AEROBIC ONLY Blood Culture adequate volume Performed at [facility], [address], [place], [place] [address]    Culture   Final    NO GROWTH 1 DAY Performed at [facility], [address] [place], [place] [address]    Report Status PENDING  Incomplete     Labs: Basic Metabolic Panel: Recent Labs  Lab 05/07/18 0403 05/07/18 1020 05/07/18 1357 05/07/18 2154 05/09/18 0827  NA 141 143 144 142 141  K 5.7* 4.3 4.0 3.8 4.2  CL 112* 113* 110 106 107  CO2 22 20* 28 30 27   GLUCOSE 112* 148* 114* 139* 121*  BUN 18 14 13 9 14   CREATININE 1.44* 1.21 1.00  0.95 0.92  CALCIUM 7.6* 8.2* 8.2* 8.5* 9.5  PHOS  --   --  2.7 1.7*  --    Liver Function Tests: Recent Labs  Lab 05/06/18 2004 05/07/18 0147 05/07/18 1357 05/07/18 2154 05/08/18 0837 05/09/18 0827  AST 81* 233*  --   --  303* 221*  ALT 35 73*  --   --  118* 119*  ALKPHOS 112 62  --   --  58 67  BILITOT 0.8 0.8  --   --  2.2* 1.7*  PROT 7.7 6.0*  --   --  6.4* 7.5  ALBUMIN 3.9 3.1* 2.9* 2.8* 3.4* 3.7   No results for input(s): LIPASE, AMYLASE in the last 168 hours. No results for input(s): AMMONIA in the last 168 hours. CBC: Recent Labs  Lab 05/06/18 1942 05/07/18 0147 05/09/18 0827  WBC 23.6* 27.2* 8.2  NEUTROABS 21.4* 25.0*  --   HGB 17.5* 14.9 15.1  HCT 50.9 43.2 43.8  MCV 86.6 85.2 83.9  PLT 290 245 208   Cardiac  Enzymes: Recent Labs  Lab 05/06/18 2004 05/07/18 0147 05/07/18 0623 05/07/18 1357 05/07/18 2154  CKTOTAL 1,534* 2,760*  --  12,125* 9,098*  TROPONINI 0.42* 0.75* 0.85* 0.76*  --    BNP: BNP (last 3 results) No results for input(s): BNP in the last 8760 hours.  ProBNP (last 3 results) No results for input(s): PROBNP in the last 8760 hours.  CBG: Recent Labs  Lab 05/07/18 1532 05/07/18 2000 05/08/18 0012 05/08/18 0353 05/08/18 0729  GLUCAP 74 117* 105* 103* 102*       Signed:  Laverna Peace, MD [facility] 05/09/2018, 1:24 PM

## 2018-05-09 NOTE — Clinical Social Work Note (Signed)
Clinical Social Work Assessment  Patient Details  Name: Gerald Oliver MRN: [id number] Date of Birth: 07/29/1991  Date of referral:  05/09/18               Reason for consult:  Substance Use/ETOH Abuse, Housing Concerns/Homelessness                Permission sought to share information with:    Permission granted to share information::  No  Name::        Agency::     Relationship::     Contact Information:     Housing/Transportation Living arrangements for the past 2 months:  No permanent address Source of Information:  Patient Patient Interpreter Needed:  None Criminal Activity/Legal Involvement Pertinent to Current Situation/Hospitalization:  No - Comment as needed Significant Relationships:  None Lives with:  Self Do you feel safe going back to the place where you live?  No Need for family participation in patient care:  No (Coment)  Care giving concerns:  Patient is currently homeless and has substance abuse issues. Patient reports he has no support system and currently does not have insurance.    Social Worker assessment / plan:  LCSW consulted for homeless issues.   Patient admitted for drug overdose.   LCSW met with patient at bedside. LCSW explained role and reason for visit.   Patient reports that prior to hospital stay he was living with a friend. Patient reports that he is homeless and currently unemployed. 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 Patient reports that he has been homeless for about 70 days. Patient was previously living with his mother who passed a couple months ago. Patient states he has no support system.   Patient was admitted for drug overdose. Patient states his drug of choice is opiates. He has been using for 2-3 months. He reports using soon after his mother passed.   LCSW provided patient with shelter and SA resources. LCSW provided bus pass for patient to transport at dc.   Patient stated  that he ws familiar with the [facility] and had been workign with someone at [facility] about getting help. LCSW explained residential programs and the process to get in.   PLAN: patient will follow up on resources at dc.   Employment status:  Unemployed Forensic scientist:  Self Pay (Medicaid Pending) PT Recommendations:  No Follow Up Information / Referral to community resources:  Residential Substance Abuse Treatment Options, Outpatient Substance Abuse Treatment Options, Other (Comment Required)(Shelter, Housing)  Patient/Family's Response to care:  Patient is thankful for LCSW visit and resources.   Patient/Family's Understanding of and Emotional Response to Diagnosis, Current Treatment, and Prognosis:  Patient is receptive to resources given and asked questions. Patient states that he needs help and has been working with someone at [facility].   Emotional Assessment Appearance:  Appears younger than stated age Attitude/Demeanor/Rapport:    Affect (typically observed):  Accepting, Calm Orientation:  Oriented to Self, Oriented to Place, Oriented to  Time, Oriented to Situation Alcohol / Substance use:  Illicit Drugs Psych involvement (Current and /or in the community):  No (Comment)  Discharge Needs  Concerns to be addressed:  Lack of Support, Homelessness, Financial / Insurance Concerns, Substance Abuse Concerns Readmission within the last 30 days:  No Current discharge risk:  None Barriers to Discharge:  No Barriers Identified   Servando Snare, LCSW 05/09/2018, 11:41 AM

## 2018-05-12 LAB — CULTURE, BLOOD (ROUTINE X 2)
CULTURE: NO GROWTH
Culture: NO GROWTH
SPECIAL REQUESTS: ADEQUATE
Special Requests: ADEQUATE

## 2019-09-25 ENCOUNTER — Emergency Department (HOSPITAL_COMMUNITY)
Admission: EM | Admit: 2019-09-25 | Discharge: 2019-09-25 | Disposition: A | Discharge status: Home / Self Care | Payer: Self-pay | Attending: Emergency Medicine | Admitting: Emergency Medicine

## 2019-09-25 ENCOUNTER — Other Ambulatory Visit: Payer: Self-pay

## 2019-09-25 ENCOUNTER — Encounter (HOSPITAL_COMMUNITY): Payer: Self-pay | Admitting: Emergency Medicine

## 2019-09-25 ENCOUNTER — Emergency Department (HOSPITAL_COMMUNITY): Payer: Self-pay

## 2019-09-25 DIAGNOSIS — Z87891 Personal history of nicotine dependence: Secondary | ICD-10-CM | POA: Insufficient documentation

## 2019-09-25 DIAGNOSIS — S62361D Nondisplaced fracture of neck of second metacarpal bone, left hand, subsequent encounter for fracture with routine healing: Secondary | ICD-10-CM | POA: Insufficient documentation

## 2019-09-25 DIAGNOSIS — X58XXXD Exposure to other specified factors, subsequent encounter: Secondary | ICD-10-CM | POA: Insufficient documentation

## 2019-09-25 NOTE — Progress Notes (Signed)
Orthopedic Tech Progress Note Patient Details:  Gerald Oliver 1991-07-23 [id number]  Ortho Devices Type of Ortho Device: Ace wrap, Rad Gutter splint Ortho Device/Splint Location: LUE Ortho Device/Splint Interventions: Ordered, Application   Post Interventions Patient Tolerated: Well Instructions Provided: Care of device   Braulio Bosch 09/25/2019, 12:41 PM

## 2019-09-25 NOTE — ED Notes (Signed)
Patient verbalizes understanding of discharge instructions. Opportunity for questioning and answers were provided. Armband removed by staff, pt discharged from ED.  
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
## 2019-09-25 NOTE — Discharge Instructions (Signed)
Follow-up with orthopedic provider listed below. Return to the ED if you start to have worsening symptoms, additional injury, increased swelling, redness or pain.
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
## 2019-10-02 ENCOUNTER — Other Ambulatory Visit: Payer: Self-pay | Admitting: Physician Assistant

## 2019-10-02 DIAGNOSIS — S62313A Displaced fracture of base of third metacarpal bone, left hand, initial encounter for closed fracture: Secondary | ICD-10-CM

## 2019-11-01 ENCOUNTER — Other Ambulatory Visit: Payer: Self-pay | Admitting: Critical Care Medicine

## 2019-11-01 DIAGNOSIS — Z20822 Contact with and (suspected) exposure to covid-19: Secondary | ICD-10-CM

## 2019-11-02 LAB — NOVEL CORONAVIRUS, NAA: SARS-CoV-2, NAA: NOT DETECTED

## 2019-11-22 ENCOUNTER — Other Ambulatory Visit: Payer: Self-pay

## 2019-11-22 DIAGNOSIS — Z20822 Contact with and (suspected) exposure to covid-19: Secondary | ICD-10-CM

## 2019-11-23 LAB — NOVEL CORONAVIRUS, NAA: SARS-CoV-2, NAA: NOT DETECTED

## 2019-12-23 ENCOUNTER — Other Ambulatory Visit: Payer: Self-pay

## 2019-12-23 ENCOUNTER — Encounter (HOSPITAL_COMMUNITY): Payer: Self-pay | Admitting: Emergency Medicine

## 2019-12-23 ENCOUNTER — Emergency Department (HOSPITAL_COMMUNITY)
Admission: EM | Admit: 2019-12-23 | Discharge: 2019-12-23 | Disposition: A | Discharge status: Home / Self Care | Payer: Self-pay | Attending: Emergency Medicine | Admitting: Emergency Medicine

## 2019-12-23 ENCOUNTER — Emergency Department (HOSPITAL_COMMUNITY): Payer: Self-pay

## 2019-12-23 DIAGNOSIS — S60221A Contusion of right hand, initial encounter: Secondary | ICD-10-CM | POA: Insufficient documentation

## 2019-12-23 DIAGNOSIS — Y929 Unspecified place or not applicable: Secondary | ICD-10-CM | POA: Insufficient documentation

## 2019-12-23 DIAGNOSIS — Y9389 Activity, other specified: Secondary | ICD-10-CM | POA: Insufficient documentation

## 2019-12-23 DIAGNOSIS — Y998 Other external cause status: Secondary | ICD-10-CM | POA: Insufficient documentation

## 2019-12-23 DIAGNOSIS — Z87891 Personal history of nicotine dependence: Secondary | ICD-10-CM | POA: Insufficient documentation
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
## 2019-12-23 NOTE — Discharge Instructions (Addendum)

## 2019-12-23 NOTE — ED Notes (Signed)
Patient verbalizes understanding of discharge instructions. Opportunity for questioning and answers were provided. Armband removed by staff, pt discharged from ED.  
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
## 2019-12-27 ENCOUNTER — Other Ambulatory Visit: Payer: Self-pay | Admitting: *Deleted

## 2019-12-27 DIAGNOSIS — Z20822 Contact with and (suspected) exposure to covid-19: Secondary | ICD-10-CM

## 2019-12-28 LAB — NOVEL CORONAVIRUS, NAA: SARS-CoV-2, NAA: NOT DETECTED

## 2020-04-27 ENCOUNTER — Ambulatory Visit (HOSPITAL_COMMUNITY): Payer: Self-pay | Admitting: Licensed Clinical Social Worker

## 2020-05-14 ENCOUNTER — Ambulatory Visit (HOSPITAL_COMMUNITY): Payer: Self-pay | Admitting: Psychiatry

## 2020-11-19 ENCOUNTER — Emergency Department (HOSPITAL_COMMUNITY): Payer: Self-pay

## 2020-11-19 ENCOUNTER — Emergency Department (HOSPITAL_COMMUNITY)
Admission: EM | Admit: 2020-11-19 | Discharge: 2020-11-20 | Disposition: A | Discharge status: Home / Self Care | Payer: Self-pay | Attending: Emergency Medicine | Admitting: Emergency Medicine

## 2020-11-19 DIAGNOSIS — L03114 Cellulitis of left upper limb: Secondary | ICD-10-CM | POA: Insufficient documentation

## 2020-11-19 DIAGNOSIS — D72829 Elevated white blood cell count, unspecified: Secondary | ICD-10-CM | POA: Insufficient documentation

## 2020-11-19 DIAGNOSIS — L03119 Cellulitis of unspecified part of limb: Secondary | ICD-10-CM

## 2020-11-19 DIAGNOSIS — L02419 Cutaneous abscess of limb, unspecified: Secondary | ICD-10-CM

## 2020-11-19 DIAGNOSIS — L02414 Cutaneous abscess of left upper limb: Secondary | ICD-10-CM | POA: Insufficient documentation

## 2020-11-19 DIAGNOSIS — L089 Local infection of the skin and subcutaneous tissue, unspecified: Secondary | ICD-10-CM

## 2020-11-19 DIAGNOSIS — Z87891 Personal history of nicotine dependence: Secondary | ICD-10-CM | POA: Insufficient documentation

## 2020-11-19 LAB — URINALYSIS, ROUTINE W REFLEX MICROSCOPIC
Bilirubin Urine: NEGATIVE
Glucose, UA: NEGATIVE mg/dL
Hgb urine dipstick: NEGATIVE
Ketones, ur: NEGATIVE mg/dL
Leukocytes,Ua: NEGATIVE
Nitrite: NEGATIVE
Protein, ur: NEGATIVE mg/dL
Specific Gravity, Urine: 1.011 (ref 1.005–1.030)
pH: 6 (ref 5.0–8.0)

## 2020-11-19 LAB — COMPREHENSIVE METABOLIC PANEL
ALT: 36 U/L (ref 0–44)
AST: 26 U/L (ref 15–41)
Albumin: 3.7 g/dL (ref 3.5–5.0)
Alkaline Phosphatase: 73 U/L (ref 38–126)
Anion gap: 9 (ref 5–15)
BUN: 8 mg/dL (ref 6–20)
CO2: 25 mmol/L (ref 22–32)
Calcium: 9.4 mg/dL (ref 8.9–10.3)
Chloride: 101 mmol/L (ref 98–111)
Creatinine, Ser: 0.84 mg/dL (ref 0.61–1.24)
GFR, Estimated: >60 mL/min (ref >60)
Glucose, Bld: 97 mg/dL (ref 70–99)
Potassium: 4.1 mmol/L (ref 3.5–5.1)
Sodium: 135 mmol/L (ref 135–145)
Total Bilirubin: 1.7 mg/dL — ABNORMAL HIGH (ref 0.3–1.2)
Total Protein: 7 g/dL (ref 6.5–8.1)

## 2020-11-19 LAB — CBC WITH DIFFERENTIAL/PLATELET
Abs Immature Granulocytes: 0.04 10*3/uL (ref 0.00–0.07)
Basophils Absolute: 0 10*3/uL (ref 0.0–0.1)
Basophils Relative: 0 %
Eosinophils Absolute: 0.1 10*3/uL (ref 0.0–0.5)
Eosinophils Relative: 1 %
HCT: 42.9 % (ref 39.0–52.0)
Hemoglobin: 14 g/dL (ref 13.0–17.0)
Immature Granulocytes: 0 %
Lymphocytes Relative: 19 %
Lymphs Abs: 2.1 10*3/uL (ref 0.7–4.0)
MCH: 27.9 pg (ref 26.0–34.0)
MCHC: 32.6 g/dL (ref 30.0–36.0)
MCV: 85.6 fL (ref 80.0–100.0)
Monocytes Absolute: 0.9 10*3/uL (ref 0.1–1.0)
Monocytes Relative: 8 %
Neutro Abs: 8.2 10*3/uL — ABNORMAL HIGH (ref 1.7–7.7)
Neutrophils Relative %: 72 %
Platelets: 212 10*3/uL (ref 150–400)
RBC: 5.01 MIL/uL (ref 4.22–5.81)
RDW: 13 % (ref 11.5–15.5)
WBC: 11.3 10*3/uL — ABNORMAL HIGH (ref 4.0–10.5)
nRBC: 0 % (ref 0.0–0.2)

## 2020-11-19 LAB — LACTIC ACID, PLASMA: Lactic Acid, Venous: 1 mmol/L (ref 0.5–1.9)

## 2020-11-19 MED ORDER — LIDOCAINE-EPINEPHRINE 1 %-1:100000 IJ SOLN
10.0000 mL | Freq: Once | INTRAMUSCULAR | Status: AC
  Administered 2020-11-19: 10 mL
  Filled 2020-11-19: qty 1

## 2020-11-19 MED ORDER — DOXYCYCLINE HYCLATE 100 MG PO TABS
100.0000 mg | ORAL_TABLET | Freq: Once | ORAL | Status: AC
  Administered 2020-11-19: 100 mg via ORAL
  Filled 2020-11-19: qty 1

## 2020-11-19 MED ORDER — OXYCODONE-ACETAMINOPHEN 5-325 MG PO TABS
1.0000 | ORAL_TABLET | Freq: Once | ORAL | Status: AC
  Administered 2020-11-19: 1 via ORAL
  Filled 2020-11-19: qty 1

## 2020-11-19 MED ORDER — DOXYCYCLINE HYCLATE 100 MG PO CAPS: 100.0000 mg | ORAL_CAPSULE | Freq: Two times a day (BID) | ORAL | 0 refills | Status: DC

## 2020-11-19 NOTE — Discharge Instructions (Signed)
Please read and follow all provided instructions.  Your diagnoses today include:  1. Cellulitis and abscess of upper extremity   2. Skin infection     Tests performed today include:  Vital signs. See below for your results today.   Medications prescribed:   Doxycycline - antibiotic  You have been prescribed an antibiotic medicine: take the entire course of medicine even if you are feeling better. Stopping early can cause the antibiotic not to work.  Take any prescribed medications only as directed.   Home care instructions:   Follow any educational materials contained in this packet  Follow-up instructions: Return to the Emergency Department in 48 hours for a recheck if your symptoms are not significantly improved.  Return instructions:  Return to the Emergency Department if you have:  Fever  Worsening symptoms  Worsening pain  Worsening swelling  Redness of the skin that moves away from the affected area, especially if it streaks away from the affected area   Any other emergent concerns  Your vital signs today were: BP (!) 142/76   Pulse 79   Temp 98.5 F (36.9 C) (Oral)   Resp 20   SpO2 95%  If your blood pressure (BP) was elevated above 135/85 this visit, please have this repeated by your doctor within one month. --------------

## 2020-11-19 NOTE — Care Management (Signed)
  MATCH Medication Assistance Card Name: Italo Banton ID (MRN): [id number] [facility]: [id number] RX Group: [id number] Discharge Date:11/19/2020  Expiration Date: 12/03/2020                                           (must be filled within 7 days of discharge)       Dear   : Gerald Oliver   You have been approved to have the prescriptions written by your discharging physician filled through our [place] (Medication Assistance Through [facility]) program. This program allows for a one-time (no refills) 34-day supply of selected medications for a low copay amount.  The copay is $3.00 per prescription. For instance, if you have one prescription, you will pay $3.00; for two prescriptions, you pay $6.00; for three prescriptions, you pay $9.00; and so on.  Only certain pharmacies are participating in this program with [facility]. You will need to select one of the pharmacies from the attached list and take your prescriptions, this letter, and your photo ID to one of the participating pharmacies.   We are excited that you are able to use the [facility] program to get your medications. These prescriptions must be filled within 7 days of hospital discharge or they will no longer be valid for the [facility] program. Should you have any problems with your prescriptions please contact your case management team member at [phone] for [place].  Thank you, [facility] Health Care Management

## 2020-11-19 NOTE — ED Provider Notes (Signed)
[facility] EMERGENCY DEPARTMENT Provider Note   CSN: [id number] Arrival date & time: 11/19/20  1318     History Chief Complaint  Patient presents with  . Wound Check    Gerald Oliver is a 30 y.o. male.  Patient presents the emergency department for evaluation of skin infection.  He has a history of substance abuse but states that he has been clean and sober recently.  He is currently homeless.  Patient states that over the past 2 to 3 days he has developed redness and swelling of the left wrist and hand that is painful.  He also has a small ulceration on his left shoulder and above his left eye.  He has not had a documented fever or chills.  He feels generally bad.  No associated nausea or vomiting.  Pain is worse with movement.  He has had a small amount of drainage from these areas. The onset of this condition was acute. The course is constant. Aggravating factors: movement. Alleviating factors: none.          Past Medical History:  Diagnosis Date  . Medical history non-contributory     Patient Active Problem List   Diagnosis Date Noted  . Sepsis (HCC) 05/07/2018  . ARF (acute renal failure) (HCC) 05/07/2018  . Hyperglycemia 05/07/2018  . RUE weakness 05/07/2018  . Rhabdomyolysis 05/07/2018  . Acute respiratory failure with hypoxia (HCC) 05/06/2018  . Acute hypoxemic respiratory failure (HCC) 09/23/2017  . Heroin overdose (HCC) 09/23/2017  . Drug overdose 05/14/2014  . Heroin abuse (HCC) 05/14/2014  . Aspiration pneumonia (HCC) 05/14/2014  . AKI (acute kidney injury) (HCC) 05/14/2014  . Unresponsive 05/14/2014    Past Surgical History:  Procedure Laterality Date  . WISDOM TOOTH EXTRACTION         Family History  Family history unknown: Yes    Social History   Tobacco Use  . Smoking status: Former Smoker    Types: Cigarettes    Quit date: 05/14/2008    Years since quitting: 12.5  . Smokeless tobacco: Never Used  Substance Use  Topics  . Alcohol use: No    Comment: rarely  . Drug use: Yes    Comment: heroin    Home Medications Prior to Admission medications   Not on File    Allergies    Patient has no known allergies.  Review of Systems   Review of Systems  Constitutional: Negative for fever.  Gastrointestinal: Negative for nausea and vomiting.  Skin: Positive for color change, rash and wound.       Positive for abscess  Hematological: Negative for adenopathy.    Physical Exam Updated Vital Signs BP (!) 142/76   Pulse 79   Temp 98.5 F (36.9 C) (Oral)   Resp 20   SpO2 95%   Physical Exam Vitals and nursing note reviewed.  Constitutional:      Appearance: He is well-developed and well-nourished.  HENT:     Head: Normocephalic and atraumatic.  Eyes:     Conjunctiva/sclera: Conjunctivae normal.  Pulmonary:     Effort: No respiratory distress.  Musculoskeletal:     Cervical back: Normal range of motion and neck supple.     Comments: Patient with abscess, scant amt of active drainage with several cm of surrounding cellulitis as pictured. No lymphangitis. Diffuse swelling of hand without redness.   Able to flex and extend left wrist reasonably well given pain 2/2 overlying infection.   Ring on left index  finger that is moveable.   Skin:    General: Skin is warm and dry.  Neurological:     Mental Status: He is alert.  Psychiatric:        Mood and Affect: Mood and affect normal.            ED Results / Procedures / Treatments   Labs (all labs ordered are listed, but only abnormal results are displayed) Labs Reviewed  COMPREHENSIVE METABOLIC PANEL - Abnormal; Notable for the following components:      Result Value   Total Bilirubin 1.7 (*)    All other components within normal limits  CBC WITH DIFFERENTIAL/PLATELET - Abnormal; Notable for the following components:   WBC 11.3 (*)    Neutro Abs 8.2 (*)    All other components within normal limits  LACTIC ACID, PLASMA   URINALYSIS, ROUTINE W REFLEX MICROSCOPIC  LACTIC ACID, PLASMA    EKG None  Radiology DG Wrist Complete Left  Result Date: 11/19/2020 CLINICAL DATA:  Soft tissue infection. EXAM: LEFT WRIST - COMPLETE 3+ VIEW COMPARISON:  September 25, 2019 FINDINGS: There is extensive soft tissue swelling about the hand. There is no acute displaced fracture or dislocation. Again noted is dorsal dislocation/subluxation of the third metacarpal. There is an old healed fracture of the second metacarpal. There is no unexpected radiopaque foreign body. No subcutaneous gas. No radiographic evidence for osteomyelitis. IMPRESSION: 1. Extensive soft tissue swelling about the hand. 2. No acute displaced fracture or dislocation. 3. Old healed fracture of the second metacarpal. 4. 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 Electronically Signed   By: Katherine Mantle M.D.   On: 11/19/2020 22:20    Procedures .Marland KitchenIncision and Drainage  Date/Time: 11/19/2020 11:15 PM Performed by: Renne Crigler, PA-C Authorized by: Renne Crigler, PA-C   Consent:    Consent obtained:  Verbal   Consent given by:  Patient   Risks discussed:  Pain and bleeding   Alternatives discussed:  No treatment Universal protocol:    Patient identity confirmed:  Verbally with patient, arm band and provided demographic data Location:    Type:  Abscess   Size:  2cm   Location:  Upper extremity   Upper extremity location:  Wrist   Wrist location:  L wrist Pre-procedure details:    Skin preparation:  Povidone-iodine Sedation:    Sedation type:  None Anesthesia:    Anesthesia method:  Local infiltration   Local anesthetic:  Lidocaine 2% WITH epi Procedure type:    Complexity:  Simple Procedure details:    Incision types:  Stab incision and single straight   Wound management:  Probed and deloculated   Drainage:  Purulent   Drainage amount:  Moderate   Wound treatment:  Wound left open   Packing materials:   None Post-procedure details:    Procedure completion:  Tolerated     Medications Ordered in ED Medications  doxycycline (VIBRA-TABS) tablet 100 mg (100 mg Oral Given 11/19/20 2255)  oxyCODONE-acetaminophen (PERCOCET/ROXICET) 5-325 MG per tablet 1 tablet (1 tablet Oral Given 11/19/20 2255)  lidocaine-EPINEPHrine (XYLOCAINE W/EPI) 1 %-1:100000 (with pres) injection 10 mL (10 mLs Infiltration Given 11/19/20 2256)    ED Course  I have reviewed the triage vital signs and the nursing notes.  Pertinent labs & imaging results that were available during my care of the patient were reviewed by me and considered in my medical decision making (see chart for details).  Patient seen and examined. Work-up initiated. Medications ordered. Discussed  with Dr. Rubin Payor. Will involve TOC given medication needs -- pt states he has 50 cents to his name and would not be able to fill abx or follow-up with a doctor. He is high-risk for worsening so will give abx here, obtain x-ray, perform I&D. No fever or signs of sepsis that would warrant inpt admission.   Vital signs reviewed and are as follows: BP (!) 142/76   Pulse 79   Temp 98.5 F (36.9 C) (Oral)   Resp 20   SpO2 95%   I discussed medication needs with transition of care team.  Nurse case manager Burna Mortimer has seen patient.  Patient will qualify for match program for antibiotics.  I will send these in and patient will be provided with a cab voucher.  X-ray reviewed, no foreign bodies or other acute problems.  I&D performed without complication.  I also removed the ring from the patient's left index finger with the ring cutter.  There were no complications with this.  Patient was given a dose of Percocet and doxycycline here in emergency department.  He will be provided with a wrist splint for comfort.  Patient was also given a something to eat and drink.  Plan bandaging, wrist splint, discharge with strict return instructions.  The patient was urged to  return to the Emergency Department urgently with worsening pain, swelling, expanding erythema especially if it streaks away from the affected area, fever, or if they have any other concerns.   The patient was urged to return to the Emergency Department or go to their PCP in 48 hours for packing removal and wound recheck.   The patient verbalized understanding and stated agreement with this plan.     MDM Rules/Calculators/A&P                          Abscess of wrist and hand with associated cellulitis.  No signs of sepsis today.  Mildly elevated white blood cell count.  No signs of lymphangitis or constitutional symptoms.  Social issues addressed as best as possible.  At this point really do not feel that the patient warrants admission however admittedly he is high risk for complications/incomplete treatment.  I do not suspect septic arthritis at this time. He has a small open area on shoulder and a small area over his eye which do not need drained currently.     Final Clinical Impression(s) / ED Diagnoses Final diagnoses:  Cellulitis and abscess of upper extremity    Rx / DC Orders ED Discharge Orders    None       Desmond Dike 11/19/20 2325    Benjiman Core, MD 11/19/20 2326

## 2020-11-19 NOTE — ED Triage Notes (Signed)
Pt arrives to ED with wound to left hand and over the last 48 hours he reports increased swelling and redness. He states this started as a open wound and due to his homelessness he feels it got infected. He denies any recent IV drug use.

## 2020-11-19 NOTE — Care Management (Signed)
Patient homeless and need medication assistance. MATCHED patient sent MATCH letter to the [facility] on [address] explained that RN CM will be able to provide taxi voucher to pharmacy to pick up meds. Patient also given homeless resource guide, no further ED RNCM needs

## 2020-11-20 MED ORDER — DOXYCYCLINE HYCLATE 100 MG PO CAPS: 100.0000 mg | ORAL_CAPSULE | Freq: Two times a day (BID) | ORAL | 0 refills | Status: DC

## 2020-11-20 NOTE — Progress Notes (Signed)
Orthopedic Tech Progress Note Patient Details:  Gerald Oliver September 06, 1991 [id number]  Ortho Devices Type of Ortho Device: Velcro wrist splint Ortho Device/Splint Location: Left Wrist Ortho Device/Splint Interventions: Application   Post Interventions Patient Tolerated: Well Instructions Provided: Adjustment of device   [name] E Shafer Swamy 11/20/2020, 12:32 AM

## 2020-11-20 NOTE — ED Notes (Signed)
Dressing applied to left wrist. - Mepilex border gauze.

## 2020-12-29 ENCOUNTER — Other Ambulatory Visit: Payer: Self-pay | Admitting: Physician Assistant

## 2020-12-29 DIAGNOSIS — S62313A Displaced fracture of base of third metacarpal bone, left hand, initial encounter for closed fracture: Secondary | ICD-10-CM

## 2021-02-14 DIAGNOSIS — T401X1A Poisoning by heroin, accidental (unintentional), initial encounter: Secondary | ICD-10-CM

## 2021-02-14 HISTORY — DX: Poisoning by heroin, accidental (unintentional), initial encounter: T40.1X1A

## 2021-02-22 ENCOUNTER — Other Ambulatory Visit: Payer: Self-pay

## 2021-02-22 ENCOUNTER — Inpatient Hospital Stay (HOSPITAL_COMMUNITY)
Admission: EM | Admit: 2021-02-22 | Discharge: 2021-02-24 | DRG: 177 | Disposition: A | Discharge status: Home / Self Care | Payer: Self-pay | Attending: Internal Medicine | Admitting: Internal Medicine

## 2021-02-22 ENCOUNTER — Emergency Department (HOSPITAL_COMMUNITY): Payer: Self-pay

## 2021-02-22 ENCOUNTER — Encounter (HOSPITAL_COMMUNITY): Payer: Self-pay | Admitting: *Deleted

## 2021-02-22 DIAGNOSIS — Z20822 Contact with and (suspected) exposure to covid-19: Secondary | ICD-10-CM | POA: Diagnosis present

## 2021-02-22 DIAGNOSIS — F1110 Opioid abuse, uncomplicated: Secondary | ICD-10-CM | POA: Diagnosis present

## 2021-02-22 DIAGNOSIS — Z5900 Homelessness unspecified: Secondary | ICD-10-CM

## 2021-02-22 DIAGNOSIS — J690 Pneumonitis due to inhalation of food and vomit: Principal | ICD-10-CM | POA: Diagnosis present

## 2021-02-22 DIAGNOSIS — J9601 Acute respiratory failure with hypoxia: Secondary | ICD-10-CM | POA: Diagnosis present

## 2021-02-22 DIAGNOSIS — T50901A Poisoning by unspecified drugs, medicaments and biological substances, accidental (unintentional), initial encounter: Secondary | ICD-10-CM | POA: Diagnosis present

## 2021-02-22 DIAGNOSIS — T401X1A Poisoning by heroin, accidental (unintentional), initial encounter: Secondary | ICD-10-CM | POA: Diagnosis present

## 2021-02-22 DIAGNOSIS — T40601A Poisoning by unspecified narcotics, accidental (unintentional), initial encounter: Secondary | ICD-10-CM

## 2021-02-22 DIAGNOSIS — N179 Acute kidney failure, unspecified: Secondary | ICD-10-CM | POA: Diagnosis present

## 2021-02-22 DIAGNOSIS — Z87891 Personal history of nicotine dependence: Secondary | ICD-10-CM

## 2021-02-22 LAB — LIPASE, BLOOD: Lipase: 66 U/L — ABNORMAL HIGH (ref 11–51)

## 2021-02-22 LAB — COMPREHENSIVE METABOLIC PANEL
ALT: 66 U/L — ABNORMAL HIGH (ref 0–44)
AST: 107 U/L — ABNORMAL HIGH (ref 15–41)
Albumin: 4.5 g/dL (ref 3.5–5.0)
Alkaline Phosphatase: 106 U/L (ref 38–126)
Anion gap: 11 (ref 5–15)
BUN: 26 mg/dL — ABNORMAL HIGH (ref 6–20)
CO2: 24 mmol/L (ref 22–32)
Calcium: 9.5 mg/dL (ref 8.9–10.3)
Chloride: 102 mmol/L (ref 98–111)
Creatinine, Ser: 1.23 mg/dL (ref 0.61–1.24)
GFR, Estimated: >60 mL/min (ref >60)
Glucose, Bld: 113 mg/dL — ABNORMAL HIGH (ref 70–99)
Potassium: 4.8 mmol/L (ref 3.5–5.1)
Sodium: 137 mmol/L (ref 135–145)
Total Bilirubin: 1.1 mg/dL (ref 0.3–1.2)
Total Protein: 8.7 g/dL — ABNORMAL HIGH (ref 6.5–8.1)

## 2021-02-22 LAB — CBC WITH DIFFERENTIAL/PLATELET
Abs Immature Granulocytes: 0.34 K/uL — ABNORMAL HIGH (ref 0.00–0.07)
Basophils Absolute: 0.1 K/uL (ref 0.0–0.1)
Basophils Relative: 0 %
Eosinophils Absolute: 0.1 K/uL (ref 0.0–0.5)
Eosinophils Relative: 0 %
HCT: 50.8 % (ref 39.0–52.0)
Hemoglobin: 16.5 g/dL (ref 13.0–17.0)
Immature Granulocytes: 2 %
Lymphocytes Relative: 4 %
Lymphs Abs: 1 K/uL (ref 0.7–4.0)
MCH: 27.8 pg (ref 26.0–34.0)
MCHC: 32.5 g/dL (ref 30.0–36.0)
MCV: 85.7 fL (ref 80.0–100.0)
Monocytes Absolute: 0.6 K/uL (ref 0.1–1.0)
Monocytes Relative: 3 %
Neutro Abs: 19.4 K/uL — ABNORMAL HIGH (ref 1.7–7.7)
Neutrophils Relative %: 91 %
Platelets: 300 K/uL (ref 150–400)
RBC: 5.93 MIL/uL — ABNORMAL HIGH (ref 4.22–5.81)
RDW: 13.2 % (ref 11.5–15.5)
WBC: 21.5 K/uL — ABNORMAL HIGH (ref 4.0–10.5)
nRBC: 0 % (ref 0.0–0.2)

## 2021-02-22 LAB — CK: Total CK: 101 U/L (ref 49–397)

## 2021-02-22 MED ORDER — ONDANSETRON HCL 4 MG/2ML IJ SOLN
INTRAMUSCULAR | Status: AC
  Filled 2021-02-22: qty 2

## 2021-02-22 MED ORDER — ONDANSETRON HCL 4 MG/2ML IJ SOLN
4.0000 mg | Freq: Once | INTRAMUSCULAR | Status: AC
  Administered 2021-02-22: 4 mg via INTRAVENOUS

## 2021-02-22 MED ORDER — ONDANSETRON HCL 4 MG/2ML IJ SOLN: 4.0000 mg | Freq: Once | INTRAMUSCULAR | Status: DC

## 2021-02-22 MED ORDER — SODIUM CHLORIDE 0.9 % IV SOLN
3.0000 g | Freq: Once | INTRAVENOUS | Status: AC
  Administered 2021-02-22: 3 g via INTRAVENOUS
  Filled 2021-02-22: qty 8

## 2021-02-22 NOTE — ED Provider Notes (Signed)
[facility]-EMERGENCY DEPT Provider Note   CSN: [id number] Arrival date & time: 02/22/21  2104     History Chief Complaint  Patient presents with  . Abdominal Pain    Gerald Oliver is a 30 y.o. male.  Patient is a 30 year old male with a history of heroin abuse, prior sepsis, homelessness who is [place] living in the woods and being brought in by EMS today after friends found him unresponsive.  They administered 8 mg of Narcan prior to EMS arrival and patient was breathing spontaneously upon their arrival.  Patient does admit to using heroin today but is not sure if he used more than usual.  He is currently complaining of severe abdominal pain and nausea.  These are only things that started after he received Narcan.  He was not experiencing them earlier today.  He denies any alcohol use or other drug use.  The history is provided by the EMS personnel and a friend.  Abdominal Pain      Past Medical History:  Diagnosis Date  . Medical history non-contributory     Patient Active Problem List   Diagnosis Date Noted  . Sepsis (HCC) 05/07/2018  . ARF (acute renal failure) (HCC) 05/07/2018  . Hyperglycemia 05/07/2018  . RUE weakness 05/07/2018  . Rhabdomyolysis 05/07/2018  . Acute respiratory failure with hypoxia (HCC) 05/06/2018  . Acute hypoxemic respiratory failure (HCC) 09/23/2017  . Heroin overdose (HCC) 09/23/2017  . Drug overdose 05/14/2014  . Heroin abuse (HCC) 05/14/2014  . Aspiration pneumonia (HCC) 05/14/2014  . AKI (acute kidney injury) (HCC) 05/14/2014  . Unresponsive 05/14/2014    Past Surgical History:  Procedure Laterality Date  . WISDOM TOOTH EXTRACTION         Family History  Family history unknown: Yes    Social History   Tobacco Use  . Smoking status: Former Smoker    Types: Cigarettes    Quit date: 05/14/2008    Years since quitting: 12.7  . Smokeless tobacco: Never Used  Substance Use Topics  . Alcohol use: No     Comment: rarely  . Drug use: Yes    Comment: heroin    Home Medications Prior to Admission medications   Medication Sig Start Date End Date Taking? Authorizing Provider  doxycycline (VIBRAMYCIN) 100 MG capsule Take 1 capsule (100 mg total) by mouth 2 (two) times daily. 11/20/20   Antony Madura, PA-C    Allergies    Patient has no known allergies.  Review of Systems   Review of Systems  Gastrointestinal: Positive for abdominal pain.  All other systems reviewed and are negative.   Physical Exam Updated Vital Signs BP 122/76   Pulse 79   Temp (!) 97.5 F (36.4 C) (Oral)   Resp 17   SpO2 92%   Physical Exam Vitals and nursing note reviewed.  Constitutional:      Appearance: He is well-developed and normal weight.     Comments: Disheveled  HENT:     Head: Normocephalic.     Mouth/Throat:     Mouth: Mucous membranes are moist.  Eyes:     Extraocular Movements: Extraocular movements intact.     Pupils: Pupils are equal, round, and reactive to light.  Cardiovascular:     Rate and Rhythm: Normal rate and regular rhythm.     Pulses: Normal pulses.  Pulmonary:     Effort: Pulmonary effort is normal.     Breath sounds: Normal breath sounds. No wheezing.  Abdominal:  General: Abdomen is flat.     Palpations: Abdomen is soft.     Tenderness: There is abdominal tenderness.     Comments: Diffuse tenderness with palpation  Musculoskeletal:        General: No tenderness.     Cervical back: Normal range of motion and neck supple.     Comments: Back marks present on bilateral arms and legs no areas of cellulitis  Skin:    General: Skin is warm and dry.     Capillary Refill: Capillary refill takes less than 2 seconds.  Neurological:     Mental Status: He is oriented to person, place, and time. Mental status is at baseline.  Psychiatric:     Comments: Cooperative     ED Results / Procedures / Treatments   Labs (all labs ordered are listed, but only abnormal results are  displayed) Labs Reviewed  CBC WITH DIFFERENTIAL/PLATELET - Abnormal; Notable for the following components:      Result Value   WBC 21.5 (*)    RBC 5.93 (*)    Neutro Abs 19.4 (*)    Abs Immature Granulocytes 0.34 (*)    All other components within normal limits  COMPREHENSIVE METABOLIC PANEL - Abnormal; Notable for the following components:   Glucose, Bld 113 (*)    BUN 26 (*)    Total Protein 8.7 (*)    AST 107 (*)    ALT 66 (*)    All other components within normal limits  LIPASE, BLOOD - Abnormal; Notable for the following components:   Lipase 66 (*)    All other components within normal limits  SARS CORONAVIRUS 2 (TAT 6-24 HRS)  CK    EKG EKG Interpretation  Date/Time:  Monday Feb 22 2021 21:24:17 EDT Ventricular Rate:  89 PR Interval:  162 QRS Duration: 82 QT Interval:  336 QTC Calculation: 409 R Axis:   77 Text Interpretation: Sinus rhythm ST elev, probable normal early repol pattern No significant change since last tracing Confirmed by Gwyneth Sprout ([id number]) on 02/22/2021 9:31:57 PM   Radiology DG Chest Port 1 View  Result Date: 02/22/2021 CLINICAL DATA:  Hypoxia, recent overdose EXAM: PORTABLE CHEST 1 VIEW COMPARISON:  05/08/2018 FINDINGS: Cardiac shadow is within normal limits. The lungs are well aerated bilaterally. Patchy airspace opacity is noted throughout the left lung and within the right upper lobe. This likely represents edema given the recent history. No bony abnormality is seen. IMPRESSION: Changes consistent with multifocal edema as described. Electronically Signed   By: Alcide Clever M.D.   On: 02/22/2021 21:59    Procedures Procedures   Medications Ordered in ED Medications  ondansetron (ZOFRAN) 4 MG/2ML injection (has no administration in time range)  ondansetron (ZOFRAN) injection 4 mg (has no administration in time range)  ondansetron (ZOFRAN) injection 4 mg (4 mg Intravenous Given 02/22/21 2126)    ED Course  I have reviewed the triage vital  signs and the nursing notes.  Pertinent labs & imaging results that were available during my care of the patient were reviewed by me and considered in my medical decision making (see chart for details).    MDM Rules/Calculators/A&P                          Patient presenting to the emergency room with EMS after an accidental overdose today.  Friends found him and did administer 8 mg of Narcan.  Upon arrival here patient is awake and  alert.  He is having severe abdominal pain and nausea.  Patient oxygen saturation 85% on room air.  Breath sounds are diminished.  Patient placed on oxygen.  Given Zofran.  Labs and x-ray are pending.  EKG does not appear to be different than prior.  10:57 PM Has a white count of 21,000, stable hemoglobin and platelet count, CMP with mild elevated LFTs with AST of 107 and ALT of 66, lipase is 66 and CK still pending.  Patient's chest x-ray is consistent with multifocal edema which I suspect is most likely aspiration.  Patient had an unknown downtime before he received Narcan.  He currently is still on 5 L of oxygen, sleeping but easily arousable and satting 92%.  Feel that patient will need admission for ongoing oxygen therapy, close monitoring to ensure he does not need antibiotics.  Will admit for further care.  MDM Number of Diagnoses or Management Options   Amount and/or Complexity of Data Reviewed Clinical lab tests: ordered and reviewed Tests in the radiology section of CPT: ordered and reviewed Tests in the medicine section of CPT: ordered and reviewed Decide to obtain previous medical records or to obtain history from someone other than the patient: yes Obtain history from someone other than the patient: yes Review and summarize past medical records: yes Discuss the patient with other providers: yes Independent visualization of images, tracings, or specimens: yes  Risk of Complications, Morbidity, and/or Mortality Presenting problems: high Diagnostic  procedures: high Management options: high  Patient Progress Patient progress: stable   Final Clinical Impression(s) / ED Diagnoses Final diagnoses:  Aspiration pneumonia of left lung, unspecified aspiration pneumonia type, unspecified part of lung (HCC)  Opiate overdose, accidental or unintentional, initial encounter (HCC)  Acute respiratory failure with hypoxia [facility])    Rx / DC Orders ED Discharge Orders    None       Gwyneth Sprout, MD 02/22/21 2300

## 2021-02-22 NOTE — ED Triage Notes (Signed)
Pt found in woods by friends and given 8mg  of Narcan prior to calling EMS for a likely overdose of heroine. Pt denies allergies or past medical history. Pt complaining of abdominal pain 10/10

## 2021-02-22 NOTE — H&P (Signed)
History and Physical    [name] HCW:[id number] DOB: 08-15-1991 DOA: 02/22/2021  PCP: Patient, No Pcp Per (Inactive)   Patient [place]: [place]  Chief Complaint: Overdose, unresponsive  HPI: Gerald Oliver is a 29 y.o. male with medical history significant for heroin abuse who is homeless was with friends in the woods where he lives presents by EMS for overdose. Reportedly was using heroin and friends found him unresponsive. They administered 8 mg of Narcan prior to EMS arrival. Pt was breathing spontaneously when EMS arrived. Mr. Gebhard admits to using heroin earlier in the day and is not sure if he used more than he normally does.  He reports he had some abdominal pain with nausea after the Narcan was given but he is not remember having any vomiting.  He states his stomach does not hurt at this time.  He denies using any alcohol or other illicit drugs.  He is tired and groggy but will awaken to voice and tactile stimulation.  ED Course: In the emergency room he is found to have diffuse airspace opacity on the left lung on chest x-ray consistent with aspiration pneumonia.  He was initially hypoxic in the mid to low 80s on room air and required supplemental oxygen by nasal cannula.  Electrolytes and renal function are unremarkable.  Lipase mildly evaded at 66, AST 107, ALT 166 with alkaline phosphatase of 106.  He has no history of pancreatitis and no abdominal pain at this time.  WBC is elevated 21,500.  Platelets 300,000, hemoglobin 16.5 hematocrit 50.8.  Patient started on antibiotic coverage for aspiration pneumonia with Unasyn.  Hospitalist service asked to admit for further management  Review of Systems:  General: Denies weakness, fever, chills, weight loss, night sweats. Denies dizziness. Denies change in appetite HENT: Denies head trauma, headache, denies change in hearing, tinnitus.  Denies nasal congestion or bleeding.  Denies sore throat, sores in mouth.  Denies  difficulty swallowing Eyes: Denies blurry vision, pain in eye, drainage.  Denies discoloration of eyes. Neck: Denies pain.  Denies swelling.  Denies pain with movement. Cardiovascular: Denies chest pain, palpitations.  Denies edema.  Denies orthopnea Respiratory: Denies shortness of breath, cough.  Denies wheezing.  Denies sputum production Gastrointestinal: Reports abdominal pain.  Denies nausea, diarrhea. Unsure if he vomited he states.  Denies melena.  Denies hematemesis. Musculoskeletal: Denies limitation of movement. Denies deformity or swelling. Denies arthralgias or myalgias. Genitourinary: Denies pelvic pain.  Denies urinary frequency or hesitancy.  Denies dysuria.  Skin: Denies rash.  Denies petechiae, purpura, ecchymosis. Neurological:  Denies seizure activity. Denies paresthesia. Denies slurred speech, drooping face. Denies visual change. Psychiatric: Denies depression, anxiety. Denies hallucinations. Denies suicidal ideation  Past Medical History:  Diagnosis Date  . Medical history non-contributory     Past Surgical History:  Procedure Laterality Date  . WISDOM TOOTH EXTRACTION      Social History  reports that he quit smoking about 12 years ago. His smoking use included cigarettes. He has never used smokeless tobacco. He reports current drug use. He reports that he does not drink alcohol.  No Known Allergies  Family History  Family history unknown: Yes     Prior to Admission medications   Medication Sig Start Date End Date Taking? Authorizing Provider  doxycycline (VIBRAMYCIN) 100 MG capsule Take 1 capsule (100 mg total) by mouth 2 (two) times daily. Patient not taking: Reported on 02/22/2021 11/20/20   Antony Madura, PA-C    Physical Exam: Vitals:  02/22/21 2130 02/22/21 2202 02/22/21 2248 02/22/21 2315  BP:  120/71 122/76 (!) 129/59  Pulse: 88 93 79 73  Resp: (!) 21 20 17 15   Temp: (!) 97.5 F (36.4 C)     TempSrc: Oral     SpO2: (!) 89% 92% 92% 97%     Constitutional: NAD, lethargic but awakens to voice. Disshelved Vitals:   02/22/21 2130 02/22/21 2202 02/22/21 2248 02/22/21 2315  BP:  120/71 122/76 (!) 129/59  Pulse: 88 93 79 73  Resp: (!) 21 20 17 15   Temp: (!) 97.5 F (36.4 C)     TempSrc: Oral     SpO2: (!) 89% 92% 92% 97%   General: WDWN, Alert and oriented x3.  Eyes: EOMI, PERRL,  conjunctivae normal.  Sclera nonicteric HENT:  [place]/AT, external ears normal.  Nares patent without epistasis.  Mucous membranes are moist.  Poor dentition.  Neck: Soft, normal range of motion, supple, no masses, no thyromegaly.  Trachea midline Respiratory: Diminished breath sounds on left side, diffuse rales on left, no wheezing, no crackles. Normal respiratory effort. No accessory muscle use.  Cardiovascular: Regular rate and rhythm, no murmurs / rubs / gallops. No extremity edema. 2+ pedal pulses.  Abdomen: Soft, no tenderness, nondistended, no rebound or guarding.  No masses palpated. No hepatosplenomegaly. Bowel sounds normoactive Musculoskeletal: FROM. no cyanosis. No joint deformity upper and lower extremities. Normal muscle tone.  Skin: Warm, dry, intact no rashes, lesions, ulcers. No induration. Track marks on arms and legs. No cellulitis noted.  Neurologic: CN 2-12 grossly intact. Normal speech. Sensation intact, patella DTR +1 bilaterally. Strength 5/5 in all extremities.    Labs on Admission: I have personally reviewed following labs and imaging studies  CBC: Recent Labs  Lab 02/22/21 2215  WBC 21.5*  NEUTROABS 19.4*  HGB 16.5  HCT 50.8  MCV 85.7  PLT 300    Basic Metabolic Panel: Recent Labs  Lab 02/22/21 2215  NA 137  K 4.8  CL 102  CO2 24  GLUCOSE 113*  BUN 26*  CREATININE 1.23  CALCIUM 9.5    GFR: CrCl cannot be calculated (Unknown ideal weight.).  Liver Function Tests: Recent Labs  Lab 02/22/21 2215  AST 107*  ALT 66*  ALKPHOS 106  BILITOT 1.1  PROT 8.7*  ALBUMIN 4.5    Urine analysis:     Component Value Date/Time   COLORURINE YELLOW 11/19/2020 1715   APPEARANCEUR CLEAR 11/19/2020 1715   LABSPEC 1.011 11/19/2020 1715   PHURINE 6.0 11/19/2020 1715   GLUCOSEU NEGATIVE 11/19/2020 1715   HGBUR NEGATIVE 11/19/2020 1715   BILIRUBINUR NEGATIVE 11/19/2020 1715   KETONESUR NEGATIVE 11/19/2020 1715   PROTEINUR NEGATIVE 11/19/2020 1715   UROBILINOGEN 0.2 05/14/2014 2035   NITRITE NEGATIVE 11/19/2020 1715   LEUKOCYTESUR NEGATIVE 11/19/2020 1715    Radiological Exams on Admission: DG Chest Port 1 View  Result Date: 02/22/2021 CLINICAL DATA:  Hypoxia, recent overdose EXAM: PORTABLE CHEST 1 VIEW COMPARISON:  05/08/2018 FINDINGS: Cardiac shadow is within normal limits. The lungs are well aerated bilaterally. Patchy airspace opacity is noted throughout the left lung and within the right upper lobe. This likely represents edema given the recent history. No bony abnormality is seen. IMPRESSION: Changes consistent with multifocal edema as described. Electronically Signed   By: 04/24/2021 M.D.   On: 02/22/2021 21:59    EKG: Independently reviewed. EKG shows normal sinus rhythm with early repolarization.  No STEMI.  QTc 409  Assessment/Plan Principal Problem:  Aspiration pneumonia  Mr Renwick will be admitted to MedSurg floor. Aspirated during an unintentional drug overdose when using heroin. Started on Unasyn for empiric antibiotic coverage. Albuterol nebulizer as needed for shortness of breath, cough, wheeze Check CBC, BMP in morning IVF hydration with normal saline at 100 ml/hr May eat once more awake and alert.  Active Problems:   Acute hypoxemic respiratory failure Supplemental oxygen provided by nasal cannula to keep O2 sat between 92 to 96%.  Patient is currently stable on nasal cannula.  Continue to monitor Incentive spirometer every hour while awake    Drug overdose   Heroin abuse  Social work to help identify outpatient drug rehab programs for patient     Homeless Consult social work to assist with discharge planning and placement    DVT prophylaxis: TED hose and early ambulation for DVT prophylaxis.     Code Status:   Full code Family Communication:  Diagnosis and plan discussed with pt. No family present. Further recommendations to follow as clinically indicated.  Disposition Plan:   Patient is from:  Pt is homeless  Anticipated DC to:  To be determined.   Anticipated DC date:  Anticipate two midnight or more stay   Admission status:  Inpatient  Claudean Severance Dorothia Passmore MD [facility]  How to contact the [facility] Attending or Consulting provider 7A - 7P or covering provider during after hours 7P -7A, for this patient?   1. Check the care team in [facility] and look for a) attending/consulting [facility] provider listed and b) the [facility] team listed 2. Log into [email or web address] and use [place]'s universal password to access. If you do not have the password, please contact the hospital operator. 3. Locate the [facility] provider you are looking for under [facility] and page to a number that you can be directly reached. 4. If you still have difficulty reaching the provider, please page the [facility] (Director on Call) for the Hospitalists listed on amion for assistance.  02/22/2021, 11:40 PM

## 2021-02-22 NOTE — ED Notes (Signed)
Pt with low o2 sats on room air. 4 liters o2 [place] administered. MD at bedside is aware.

## 2021-02-22 NOTE — H&P (Incomplete)
History and Physical    Gerald Oliver SAY:[id number] DOB: 08-18-1991 DOA: 02/22/2021  PCP: Patient, No Pcp Per (Inactive)   Patient coming from: ***  Chief Complaint: ***  HPI: Gerald Oliver is a 30 y.o. male with medical history significant for***   ED Course: ***  Review of Systems:  General: Denies weakness, fever, chills, weight loss, night sweats.  Denies dizziness.  Denies change in appetite HENT: Denies head trauma, headache, denies change in hearing, tinnitus.  Denies nasal congestion or bleeding.  Denies sore throat, sores in mouth.  Denies difficulty swallowing Eyes: Denies blurry vision, pain in eye, drainage.  Denies discoloration of eyes. Neck: Denies pain.  Denies swelling.  Denies pain with movement. Cardiovascular: Denies chest pain, palpitations.  Denies edema.  Denies orthopnea Respiratory: Denies shortness of breath, cough.  Denies wheezing.  Denies sputum production Gastrointestinal: Reports abdominal pain.  Denies nausea, diarrhea. Unsure if he vomited he states.  Denies melena.  Denies hematemesis. Musculoskeletal: Denies limitation of movement. Denies deformity or swelling. Denies arthralgias or myalgias. Genitourinary: Denies pelvic pain.  Denies urinary frequency or hesitancy.  Denies dysuria.  Skin: Denies rash.  Denies petechiae, purpura, ecchymosis. Neurological:  Denies seizure activity. Denies paresthesia. Denies slurred speech, drooping face.  Denies visual change. Psychiatric: Denies depression, anxiety. Denies hallucinations. Denies suicidal ideation  Past Medical History:  Diagnosis Date  . Medical history non-contributory     Past Surgical History:  Procedure Laterality Date  . WISDOM TOOTH EXTRACTION      Social History  reports that he quit smoking about 12 years ago. His smoking use included cigarettes. He has never used smokeless tobacco. He reports current drug use. He reports that he does not drink alcohol.  No Known  Allergies  Family History  Family history unknown: Yes     Prior to Admission medications   Medication Sig Start Date End Date Taking? Authorizing Provider  doxycycline (VIBRAMYCIN) 100 MG capsule Take 1 capsule (100 mg total) by mouth 2 (two) times daily. Patient not taking: Reported on 02/22/2021 11/20/20   Antony Madura, PA-C    Physical Exam: Vitals:   02/22/21 2130 02/22/21 2202 02/22/21 2248 02/22/21 2315  BP:  120/71 122/76 (!) 129/59  Pulse: 88 93 79 73  Resp: (!) 21 20 17 15   Temp: (!) 97.5 F (36.4 C)     TempSrc: Oral     SpO2: (!) 89% 92% 92% 97%    Constitutional: NAD, lethargic but awakens to voice. Disshelved Vitals:   02/22/21 2130 02/22/21 2202 02/22/21 2248 02/22/21 2315  BP:  120/71 122/76 (!) 129/59  Pulse: 88 93 79 73  Resp: (!) 21 20 17 15   Temp: (!) 97.5 F (36.4 C)     TempSrc: Oral     SpO2: (!) 89% 92% 92% 97%   General: WDWN, Alert and oriented x3.  Eyes: EOMI, PERRL,  conjunctivae normal.  Sclera nonicteric HENT:  [place]/AT, external ears normal.  Nares patent without epistasis.  Mucous membranes are moist.  Poor dentition.  Neck: Soft, normal range of motion, supple, no masses, no thyromegaly.  Trachea midline Respiratory: Diminished breath sounds on left side, diffuse rales on left, no wheezing, no crackles. Normal respiratory effort. No accessory muscle use.  Cardiovascular: Regular rate and rhythm, no murmurs / rubs / gallops. No extremity edema. 2+ pedal pulses.  Abdomen: Soft, no tenderness, nondistended, no rebound or guarding.  No masses palpated. No hepatosplenomegaly. Bowel sounds normoactive Musculoskeletal: FROM. no cyanosis. No joint  deformity upper and lower extremities. Normal muscle tone.  Skin: Warm, dry, intact no rashes, lesions, ulcers. No induration Neurologic: CN 2-12 grossly intact. Normal speech. Sensation intact, patella DTR +1 bilaterally. Strength 5/5 in all extremities.    Labs on Admission: I have personally reviewed  following labs and imaging studies  CBC: Recent Labs  Lab 02/22/21 2215  WBC 21.5*  NEUTROABS 19.4*  HGB 16.5  HCT 50.8  MCV 85.7  PLT 300    Basic Metabolic Panel: Recent Labs  Lab 02/22/21 2215  NA 137  K 4.8  CL 102  CO2 24  GLUCOSE 113*  BUN 26*  CREATININE 1.23  CALCIUM 9.5    GFR: CrCl cannot be calculated (Unknown ideal weight.).  Liver Function Tests: Recent Labs  Lab 02/22/21 2215  AST 107*  ALT 66*  ALKPHOS 106  BILITOT 1.1  PROT 8.7*  ALBUMIN 4.5    Urine analysis:    Component Value Date/Time   COLORURINE YELLOW 11/19/2020 1715   APPEARANCEUR CLEAR 11/19/2020 1715   LABSPEC 1.011 11/19/2020 1715   PHURINE 6.0 11/19/2020 1715   GLUCOSEU NEGATIVE 11/19/2020 1715   HGBUR NEGATIVE 11/19/2020 1715   BILIRUBINUR NEGATIVE 11/19/2020 1715   KETONESUR NEGATIVE 11/19/2020 1715   PROTEINUR NEGATIVE 11/19/2020 1715   UROBILINOGEN 0.2 05/14/2014 2035   NITRITE NEGATIVE 11/19/2020 1715   LEUKOCYTESUR NEGATIVE 11/19/2020 1715    Radiological Exams on Admission: DG Chest Port 1 View  Result Date: 02/22/2021 CLINICAL DATA:  Hypoxia, recent overdose EXAM: PORTABLE CHEST 1 VIEW COMPARISON:  05/08/2018 FINDINGS: Cardiac shadow is within normal limits. The lungs are well aerated bilaterally. Patchy airspace opacity is noted throughout the left lung and within the right upper lobe. This likely represents edema given the recent history. No bony abnormality is seen. IMPRESSION: Changes consistent with multifocal edema as described. Electronically Signed   By: Alcide Clever M.D.   On: 02/22/2021 21:59    EKG: Independently reviewed. EKG shows normal sinus rhythm with early repolarization.  No STEMI.  QTc 409  Assessment/Plan Principal Problem:   Aspiration pneumonia  Mr Guandique will be admitted to MedSurg floor. Aspirated during an unintentional drug overdose when using heroin. Started on Unasyn for empiric antibiotic coverage. Albuterol nebulizer as needed  for shortness of breath, cough, wheeze Check CBC, BMP in morning IVF hydration with normal saline at 100 ml/hr May eat once more awake and alert.  Active Problems:   Acute hypoxemic respiratory failure Supplemental oxygen provided by nasal cannula to keep O2 sat between 92 to 96%.  Patient is currently stable on nasal cannula.  Continue to monitor Incentive spirometer every hour while awake    Drug overdose   Heroin abuse  Social work to help identify outpatient drug rehab programs for patient    Homeless Consult social work to assist with discharge planning and placement    DVT prophylaxis: TED hose and early ambulation for DVT prophylaxis.     Code Status:   Full code Family Communication:  Diagnosis and plan discussed with pt. No family present. Further recommendations to follow as clinically indicated.  Disposition Plan:   Patient is from:  Pt is homeless  Anticipated DC to:  To be determined.   Anticipated DC date:  Anticipate two midnight or more stay   Admission status:  Inpatient  Claudean Severance Ankur Snowdon MD [facility]  How to contact the [facility] Attending or Consulting provider 7A - 7P or covering provider during after hours 7P -7A, for this  patient?   1. Check the care team in [facility] and look for a) attending/consulting [facility] provider listed and b) the [facility] team listed 2. Log into [email or web address] and use [facility] [place]'s universal password to access. If you do not have the password, please contact the hospital operator. 3. Locate the [facility] provider you are looking for under [facility] and page to a number that you can be directly reached. 4. If you still have difficulty reaching the provider, please page the [facility] (Director on Call) for the Hospitalists listed on amion for assistance.  02/22/2021, 11:40 PM

## 2021-02-23 DIAGNOSIS — F1110 Opioid abuse, uncomplicated: Secondary | ICD-10-CM

## 2021-02-23 DIAGNOSIS — T50901A Poisoning by unspecified drugs, medicaments and biological substances, accidental (unintentional), initial encounter: Secondary | ICD-10-CM

## 2021-02-23 DIAGNOSIS — J9601 Acute respiratory failure with hypoxia: Secondary | ICD-10-CM

## 2021-02-23 DIAGNOSIS — Z5900 Homelessness unspecified: Secondary | ICD-10-CM

## 2021-02-23 LAB — CBC
HCT: 42.2 % (ref 39.0–52.0)
Hemoglobin: 14.1 g/dL (ref 13.0–17.0)
MCH: 28.7 pg (ref 26.0–34.0)
MCHC: 33.4 g/dL (ref 30.0–36.0)
MCV: 85.8 fL (ref 80.0–100.0)
Platelets: 210 10*3/uL (ref 150–400)
RBC: 4.92 MIL/uL (ref 4.22–5.81)
RDW: 13.2 % (ref 11.5–15.5)
WBC: 21.2 10*3/uL — ABNORMAL HIGH (ref 4.0–10.5)
nRBC: 0 % (ref 0.0–0.2)

## 2021-02-23 LAB — BASIC METABOLIC PANEL
Anion gap: 5 (ref 5–15)
BUN: 26 mg/dL — ABNORMAL HIGH (ref 6–20)
CO2: 30 mmol/L (ref 22–32)
Calcium: 9 mg/dL (ref 8.9–10.3)
Chloride: 102 mmol/L (ref 98–111)
Creatinine, Ser: 1.05 mg/dL (ref 0.61–1.24)
GFR, Estimated: >60 mL/min (ref >60)
Glucose, Bld: 140 mg/dL — ABNORMAL HIGH (ref 70–99)
Potassium: 4.4 mmol/L (ref 3.5–5.1)
Sodium: 137 mmol/L (ref 135–145)

## 2021-02-23 LAB — HIV ANTIBODY (ROUTINE TESTING W REFLEX): HIV Screen 4th Generation wRfx: NONREACTIVE

## 2021-02-23 LAB — SARS CORONAVIRUS 2 (TAT 6-24 HRS): SARS Coronavirus 2: NEGATIVE

## 2021-02-23 MED ORDER — ALBUTEROL SULFATE (2.5 MG/3ML) 0.083% IN NEBU: 2.5000 mg | INHALATION_SOLUTION | RESPIRATORY_TRACT | Status: DC | PRN

## 2021-02-23 MED ORDER — ACETAMINOPHEN 325 MG PO TABS
650.0000 mg | ORAL_TABLET | Freq: Four times a day (QID) | ORAL | Status: DC | PRN
  Administered 2021-02-24 (×2): 650 mg via ORAL
  Filled 2021-02-23 (×2): qty 2

## 2021-02-23 MED ORDER — ONDANSETRON HCL 4 MG/2ML IJ SOLN
4.0000 mg | Freq: Four times a day (QID) | INTRAMUSCULAR | Status: DC | PRN
  Administered 2021-02-23: 4 mg via INTRAVENOUS
  Filled 2021-02-23: qty 2

## 2021-02-23 MED ORDER — ONDANSETRON HCL 4 MG PO TABS: 4.0000 mg | ORAL_TABLET | Freq: Four times a day (QID) | ORAL | Status: DC | PRN

## 2021-02-23 MED ORDER — SODIUM CHLORIDE 0.9 % IV SOLN
3.0000 g | Freq: Four times a day (QID) | INTRAVENOUS | Status: DC
  Administered 2021-02-23 – 2021-02-24 (×5): 3 g via INTRAVENOUS
  Filled 2021-02-23: qty 8
  Filled 2021-02-23 (×3): qty 3
  Filled 2021-02-23 (×2): qty 8

## 2021-02-23 MED ORDER — PANTOPRAZOLE SODIUM 40 MG PO TBEC
40.0000 mg | DELAYED_RELEASE_TABLET | Freq: Every day | ORAL | Status: DC
  Administered 2021-02-24: 40 mg via ORAL
  Filled 2021-02-23: qty 1

## 2021-02-23 MED ORDER — ACETAMINOPHEN 650 MG RE SUPP: 650.0000 mg | Freq: Four times a day (QID) | RECTAL | Status: DC | PRN

## 2021-02-23 MED ORDER — SODIUM CHLORIDE 0.9 % IV SOLN: INTRAVENOUS | Status: DC

## 2021-02-23 NOTE — Plan of Care (Signed)

## 2021-02-23 NOTE — Progress Notes (Signed)
PROGRESS NOTE    Gerald Oliver  HQP:[id number] DOB: Feb 19, 1991 DOA: 02/22/2021 PCP: Patient, No Pcp Per (Inactive)    Brief Narrative:  Mr. Gerald Oliver was admitted to the hospital with working diagnosis of drug overdose complicated with aspiration pneumonia.  30 year old male past medical history for heroin abuse, homeless who presented after an overdose.  Apparently he was using heroin and became unresponsive, EMS administered 8 mg of naloxone and patient was brought to the hospital.  In the emergency department he was lethargic but easy to arouse, his blood pressure 120/71, heart rate 93, respiratory rate 21, temperature 97.5, oxygen saturation 89%, lungs with diminished breath sounds on the left side, diffuse rales but no wheezing, heart S1-S2, present, rhythmic, abdomen soft nontender, no lower extremity edema.  Sodium 137, potassium 4.8, chloride 102, bicarb 24, glucose 113, BUN 26, creatinine 1.23, white count 21.5, hemoglobin 16.5, hematocrit 50.8, platelets 300. SARS COVID-19 negative.  Chest radiograph with alveolar infiltrate at the left upper lobe.  EKG 89 bpm, normal axis, normal intervals, sinus rhythm, concave ST elevation in lead II, lead III, aVF, V4-V6, no significant T wave changes.  Assessment & Plan:   Principal Problem:   Aspiration pneumonia (HCC) Active Problems:   Drug overdose   Heroin abuse (HCC)   Acute hypoxemic respiratory failure (HCC)   Homeless   1. Left upper lobe aspiration pneumonia in the setting of heroin overdose/ complicated with acute hypoxemic respiratory failure.  Oxygenation is 93 to 99% on room air, dyspnea is improving, no chest pain, no nausea or vomiting.  Continue to have elevated wbc up to 21.2   Discontinue IV fluids. Continue antibiotic therapy with Unasyn. Follow cell count in am.   2. Heroin overdose. Patient awake and alert, no nausea or vomiting, no signs of acute withdrawal. Continue neuro checks per unit protocol.  Add GI  prophylaxis   3. AKI. Renal function with serum cr at 1,0 with K at 4,4 and serum bicarbonate at 30. Patient tolerating po well.  Discontinue IV fluids.    Status is: Inpatient  Remains inpatient appropriate because:IV treatments appropriate due to intensity of illness or inability to take PO   Dispo: The patient is from: Home              Anticipated d/c is to: Home              Patient currently is not medically stable to d/c.   Difficult to place patient No  DVT prophylaxis: Enoxaparin   Code Status:  full  Family Communication:  No family at the bedside       Antimicrobials:   Unasyn    Subjective: Patient continue to be very weak and deconditioned, no nausea or vomiting, dyspnea is improving, no chest pain.   Objective: Vitals:   02/22/21 2345 02/23/21 0044 02/23/21 0508 02/23/21 1030  BP: 131/84 (!) 97/55 110/80 (!) 94/50  Pulse: 71 62 74 69  Resp: 15 17 18 15   Temp:  98.2 F (36.8 C) 97.8 F (36.6 C) 98.4 F (36.9 C)  TempSrc:  Oral Oral Oral  SpO2: 98% 100% 99% 93%  Weight:  64.1 kg      Intake/Output Summary (Last 24 hours) at 02/23/2021 1408 Last data filed at 02/23/2021 1031 Gross per 24 hour  Intake 240 ml  Output --  Net 240 ml   Filed Weights   02/23/21 0044  Weight: 64.1 kg    Examination:   General: Not in pain or  dyspnea  Neurology: Awake and alert, non focal  E ENT: no pallor, no icterus, oral mucosa moist Cardiovascular: No JVD. S1-S2 present, rhythmic, no gallops, rubs, or murmurs. No lower extremity edema. Pulmonary: positive breath sounds bilaterally, adequate air movement, no wheezing, or rhonchi positive left upper lobe rales. Gastrointestinal. Abdomen soft and non tender Skin. No rashes Musculoskeletal: no joint deformities     Data Reviewed: I have personally reviewed following labs and imaging studies  CBC: Recent Labs  Lab 02/22/21 2215 02/23/21 0547  WBC 21.5* 21.2*  NEUTROABS 19.4*  --   HGB 16.5 14.1  HCT  50.8 42.2  MCV 85.7 85.8  PLT 300 210   Basic Metabolic Panel: Recent Labs  Lab 02/22/21 2215 02/23/21 0547  NA 137 137  K 4.8 4.4  CL 102 102  CO2 24 30  GLUCOSE 113* 140*  BUN 26* 26*  CREATININE 1.23 1.05  CALCIUM 9.5 9.0   GFR: CrCl cannot be calculated (Unknown ideal weight.). Liver Function Tests: Recent Labs  Lab 02/22/21 2215  AST 107*  ALT 66*  ALKPHOS 106  BILITOT 1.1  PROT 8.7*  ALBUMIN 4.5   Recent Labs  Lab 02/22/21 2215  LIPASE 66*   No results for input(s): AMMONIA in the last 168 hours. Coagulation Profile: No results for input(s): INR, PROTIME in the last 168 hours. Cardiac Enzymes: Recent Labs  Lab 02/22/21 2207  CKTOTAL 101   BNP (last 3 results) No results for input(s): PROBNP in the last 8760 hours. HbA1C: No results for input(s): HGBA1C in the last 72 hours. CBG: No results for input(s): GLUCAP in the last 168 hours. Lipid Profile: No results for input(s): CHOL, HDL, LDLCALC, TRIG, CHOLHDL, LDLDIRECT in the last 72 hours. Thyroid Function Tests: No results for input(s): TSH, T4TOTAL, FREET4, T3FREE, THYROIDAB in the last 72 hours. Anemia Panel: No results for input(s): VITAMINB12, FOLATE, FERRITIN, TIBC, IRON, RETICCTPCT in the last 72 hours.    Radiology Studies: I have reviewed all of the imaging during this hospital visit personally     Scheduled Meds: Continuous Infusions: . sodium chloride 100 mL/hr at 02/23/21 0045  . ampicillin-sulbactam (UNASYN) IV 3 g (02/23/21 1028)     LOS: 1 day        Erum Cercone Annett Gula, MD

## 2021-02-24 LAB — CBC WITH DIFFERENTIAL/PLATELET
Abs Immature Granulocytes: 0.04 10*3/uL (ref 0.00–0.07)
Basophils Absolute: 0 10*3/uL (ref 0.0–0.1)
Basophils Relative: 1 %
Eosinophils Absolute: 0.1 10*3/uL (ref 0.0–0.5)
Eosinophils Relative: 1 %
HCT: 43.7 % (ref 39.0–52.0)
Hemoglobin: 13.8 g/dL (ref 13.0–17.0)
Immature Granulocytes: 1 %
Lymphocytes Relative: 35 %
Lymphs Abs: 2.8 10*3/uL (ref 0.7–4.0)
MCH: 27.7 pg (ref 26.0–34.0)
MCHC: 31.6 g/dL (ref 30.0–36.0)
MCV: 87.8 fL (ref 80.0–100.0)
Monocytes Absolute: 0.4 10*3/uL (ref 0.1–1.0)
Monocytes Relative: 5 %
Neutro Abs: 4.6 10*3/uL (ref 1.7–7.7)
Neutrophils Relative %: 57 %
Platelets: 189 10*3/uL (ref 150–400)
RBC: 4.98 MIL/uL (ref 4.22–5.81)
RDW: 13.1 % (ref 11.5–15.5)
WBC: 7.9 10*3/uL (ref 4.0–10.5)
nRBC: 0 % (ref 0.0–0.2)

## 2021-02-24 MED ORDER — LORAZEPAM 1 MG PO TABS
1.0000 mg | ORAL_TABLET | Freq: Once | ORAL | Status: AC
  Administered 2021-02-24: 1 mg via ORAL
  Filled 2021-02-24: qty 1

## 2021-02-24 MED ORDER — AMOXICILLIN-POT CLAVULANATE 875-125 MG PO TABS
1.0000 | ORAL_TABLET | Freq: Two times a day (BID) | ORAL | Status: DC
  Administered 2021-02-24: 1 via ORAL
  Filled 2021-02-24: qty 1

## 2021-02-24 MED ORDER — AMOXICILLIN-POT CLAVULANATE 875-125 MG PO TABS: 1.0000 | ORAL_TABLET | Freq: Two times a day (BID) | ORAL | 0 refills | Status: AC

## 2021-02-24 NOTE — Discharge Summary (Signed)
Physician Discharge Summary  Gerald Oliver YIR:[id number] DOB: 11-Nov-1990 DOA: 02/22/2021  PCP: Patient, No Pcp Per (Inactive)  Admit date: 02/22/2021 Discharge date: 02/24/2021  Admitted From: Home  Disposition:  Home   Recommendations for Outpatient Follow-up and new medication changes:  1. Follow up with PCP in 7 to 10 days.  2. Continue antibiotic therapy with Augmentin for 4 more days.  3. Patient will go to DIS program in am for drug abuse.   Home Health: no   Equipment/Devices: no    Discharge Condition: stable   CODE STATUS:  full Diet recommendation: regular   Brief/Interim Summary: Mr. Gerald Oliver was admitted to the hospital with working diagnosis of drug overdose complicated with aspiration pneumonia.  30 year old male past medical history for heroin abuse, homeless who presented after an overdose.  Apparently he was using heroin and became unresponsive, EMS administered 8 mg of naloxone and patient was brought to the hospital.  In the emergency department he was lethargic but easy to arouse, his blood pressure 120/71, heart rate 93, respiratory rate 21, temperature 97.5, oxygen saturation 89%, lungs with diminished breath sounds on the left side, diffuse rales but no wheezing, heart S1-S2, present, rhythmic, abdomen soft nontender, no lower extremity edema.  Sodium 137, potassium 4.8, chloride 102, bicarb 24, glucose 113, BUN 26, creatinine 1.23, white count 21.5, hemoglobin 16.5, hematocrit 50.8, platelets 300. SARS COVID-19 negative.  Chest radiograph with alveolar infiltrate at the left upper lobe.  EKG 89 bpm, normal axis, normal intervals, sinus rhythm, concave ST elevation in lead II, lead III, aVF, V4-V6, no significant T wave changes.  1.  Left upper lobe aspiration pneumonia, in the setting of heroin overdose, complicated with acute hypoxic respiratory failure. (No sepsis.) Patient was admitted to the medical ward, he received supplemental oxygen, bronchodilator  therapy and IV antibiotics with good toleration.  At discharge his white cell count is 7.9, his oxygenation is 96% on room air. Patient will be transition to Augmentin to continue for 4 more days.  2.  Acute kidney injury.  Patient received intravenous fluids with good toleration.  Kidney function improved.  At discharge creatinine 1.05, BUN 26, sodium 137, potassium 4.4, chloride 102 and bicarb 30.  3.  Heroin overdose.  No acute withdrawals during his hospitalization, patient will follow-up with DIS program as an outpatient.     Discharge Diagnoses:  Principal Problem:   Aspiration pneumonia [facility]) Active Problems:   Drug overdose   Heroin abuse (HCC)   Acute hypoxemic respiratory failure (HCC)   Homeless    Discharge Instructions   Allergies as of 02/24/2021   No Known Allergies     Medication List    STOP taking these medications   doxycycline 100 MG capsule Commonly known as: VIBRAMYCIN     TAKE these medications   amoxicillin-clavulanate 875-125 MG tablet Commonly known as: AUGMENTIN Take 1 tablet by mouth every 12 (twelve) hours for 4 days.       No Known Allergies      Procedures/Studies: DG Chest Port 1 View  Result Date: 02/22/2021 CLINICAL DATA:  Hypoxia, recent overdose EXAM: PORTABLE CHEST 1 VIEW COMPARISON:  05/08/2018 FINDINGS: Cardiac shadow is within normal limits. The lungs are well aerated bilaterally. Patchy airspace opacity is noted throughout the left lung and within the right upper lobe. This likely represents edema given the recent history. No bony abnormality is seen. IMPRESSION: Changes consistent with multifocal edema as described. Electronically Signed   By: Eulah Pont.D.  On: 02/22/2021 21:59      Subjective: Patient is feeling better, no nausea or vomiting, no chest pain or dyspnea.   Discharge Exam: Vitals:   02/23/21 2121 02/24/21 0457  BP: 127/65 102/74  Pulse: (!) 58 (!) 55  Resp: 18 19  Temp:    SpO2: 96% 96%    Vitals:   02/23/21 1030 02/23/21 1509 02/23/21 2121 02/24/21 0457  BP: (!) 94/50 95/77 127/65 102/74  Pulse: 69 (!) 57 (!) 58 (!) 55  Resp: 15 15 18 19   Temp: 98.4 F (36.9 C) 98.8 F (37.1 C)    TempSrc: Oral Oral    SpO2: 93% 99% 96% 96%  Weight:        General: Not in pain or dyspnea,  Neurology: Awake and alert, non focal  E ENT: no pallor, no icterus, oral mucosa moist Cardiovascular: No JVD. S1-S2 present, rhythmic, no gallops, rubs, or murmurs. No lower extremity edema. Pulmonary: positive breath sounds bilaterally, adequate air movement, no wheezing, rhonchi or rales. Gastrointestinal. Abdomen soft and non tender Skin. No rashes Musculoskeletal: no joint deformities   The results of significant diagnostics from this hospitalization (including imaging, microbiology, ancillary and laboratory) are listed below for reference.     Microbiology: Recent Results (from the past 240 hour(s))  SARS CORONAVIRUS 2 (TAT 6-24 HRS) Nasopharyngeal Nasopharyngeal Swab     Status: None   Collection Time: 02/22/21 11:22 PM   Specimen: Nasopharyngeal Swab  Result Value Ref Range Status   SARS Coronavirus 2 NEGATIVE NEGATIVE Final    Comment: (NOTE) SARS-CoV-2 target nucleic acids are NOT DETECTED.  The SARS-CoV-2 RNA is generally detectable in upper and lower respiratory specimens during the acute phase of infection. Negative results do not preclude SARS-CoV-2 infection, do not rule out co-infections with other pathogens, and should not be used as the sole basis for treatment or other patient management decisions. Negative results must be combined with clinical observations, patient history, and epidemiological information. The expected result is Negative.  Fact Sheet for Patients: 04/24/21  Fact Sheet for Healthcare Providers: HairSlick.no  This test is not yet approved or cleared by the [place] FDA and   has been authorized for detection and/or diagnosis of SARS-CoV-2 by FDA under an Emergency Use Authorization (EUA). This EUA will remain  in effect (meaning this test can be used) for the duration of the COVID-19 declaration under Se ction 564(b)(1) of the Act, 21 U.S.C. section 360bbb-3(b)(1), unless the authorization is terminated or revoked sooner.  Performed at [facility], [address] [place], [place]      Labs: BNP (last 3 results) No results for input(s): BNP in the last 8760 hours. Basic Metabolic Panel: Recent Labs  Lab 02/22/21 2215 02/23/21 0547  NA 137 137  K 4.8 4.4  CL 102 102  CO2 24 30  GLUCOSE 113* 140*  BUN 26* 26*  CREATININE 1.23 1.05  CALCIUM 9.5 9.0   Liver Function Tests: Recent Labs  Lab 02/22/21 2215  AST 107*  ALT 66*  ALKPHOS 106  BILITOT 1.1  PROT 8.7*  ALBUMIN 4.5   Recent Labs  Lab 02/22/21 2215  LIPASE 66*   No results for input(s): AMMONIA in the last 168 hours. CBC: Recent Labs  Lab 02/22/21 2215 02/23/21 0547 02/24/21 0526  WBC 21.5* 21.2* 7.9  NEUTROABS 19.4*  --  4.6  HGB 16.5 14.1 13.8  HCT 50.8 42.2 43.7  MCV 85.7 85.8 87.8  PLT 300 210  189   Cardiac Enzymes: Recent Labs  Lab 02/22/21 2207  CKTOTAL 101   BNP: Invalid input(s): POCBNP CBG: No results for input(s): GLUCAP in the last 168 hours. D-Dimer No results for input(s): DDIMER in the last 72 hours. Hgb A1c No results for input(s): HGBA1C in the last 72 hours. Lipid Profile No results for input(s): CHOL, HDL, LDLCALC, TRIG, CHOLHDL, LDLDIRECT in the last 72 hours. Thyroid function studies No results for input(s): TSH, T4TOTAL, T3FREE, THYROIDAB in the last 72 hours.  Invalid input(s): FREET3 Anemia work up No results for input(s): VITAMINB12, FOLATE, FERRITIN, TIBC, IRON, RETICCTPCT in the last 72 hours. Urinalysis    Component Value Date/Time   COLORURINE YELLOW 11/19/2020 1715   APPEARANCEUR CLEAR 11/19/2020 1715    LABSPEC 1.011 11/19/2020 1715   PHURINE 6.0 11/19/2020 1715   GLUCOSEU NEGATIVE 11/19/2020 1715   HGBUR NEGATIVE 11/19/2020 1715   BILIRUBINUR NEGATIVE 11/19/2020 1715   KETONESUR NEGATIVE 11/19/2020 1715   PROTEINUR NEGATIVE 11/19/2020 1715   UROBILINOGEN 0.2 05/14/2014 2035   NITRITE NEGATIVE 11/19/2020 1715   LEUKOCYTESUR NEGATIVE 11/19/2020 1715   Sepsis Labs Invalid input(s): PROCALCITONIN,  WBC,  LACTICIDVEN Microbiology Recent Results (from the past 240 hour(s))  SARS CORONAVIRUS 2 (TAT 6-24 HRS) Nasopharyngeal Nasopharyngeal Swab     Status: None   Collection Time: 02/22/21 11:22 PM   Specimen: Nasopharyngeal Swab  Result Value Ref Range Status   SARS Coronavirus 2 NEGATIVE NEGATIVE Final    Comment: (NOTE) SARS-CoV-2 target nucleic acids are NOT DETECTED.  The SARS-CoV-2 RNA is generally detectable in upper and lower respiratory specimens during the acute phase of infection. Negative results do not preclude SARS-CoV-2 infection, do not rule out co-infections with other pathogens, and should not be used as the sole basis for treatment or other patient management decisions. Negative results must be combined with clinical observations, patient history, and epidemiological information. The expected result is Negative.  Fact Sheet for Patients: HairSlick.no  Fact Sheet for Healthcare Providers: quierodirigir.com  This test is not yet approved or cleared by the [place] FDA and  has been authorized for detection and/or diagnosis of SARS-CoV-2 by FDA under an Emergency Use Authorization (EUA). This EUA will remain  in effect (meaning this test can be used) for the duration of the COVID-19 declaration under Se ction 564(b)(1) of the Act, 21 U.S.C. section 360bbb-3(b)(1), unless the authorization is terminated or revoked sooner.  Performed at [facility], [address], [place], [place] [address]       Time coordinating discharge: 45 minutes  SIGNED:   Coralie Keens, MD  [facility] 02/24/2021, 11:04 AM

## 2021-02-24 NOTE — Plan of Care (Signed)

## 2021-02-24 NOTE — Progress Notes (Signed)
During bedside shift change, patient was made aware that the doctor has officially released him and that he has been discharged. Patient stated he needed to get his things together. Patient after multiple attempts was walked downstairs to hospital exit near ER entrance to get to the bus stop. Explained importance of getting antibiotic prescription from [facility] as soon as possible.

## 2021-02-24 NOTE — TOC Initial Note (Signed)
Transition of Care [facility]) - Initial/Assessment Note    Patient Details  Name: Gerald Oliver MRN: [id number] Date of Birth: 06-26-1991  [facility] CM/SW Contact:    Bartholome Bill, RN Phone Number: 02/24/2021, 12:31 PM  Clinical Narrative:                 Spoke with pt at bedside. [facility]. Pt was provided with a couple of bus tickets and a coupon to make his dc antibiotic $6. RN informed of above assistance.    Expected Discharge Plan: Home/Self Care Barriers to Discharge: No Barriers Identified  Expected Discharge Plan and Services Expected Discharge Plan: Home/Self Care   Discharge Planning Services: CM Consult,Medication Assistance   Living arrangements for the past 2 months: Homeless Expected Discharge Date: 02/24/21                   Prior Living Arrangements/Services Living arrangements for the past 2 months: Homeless Lives with:: Self          Activities of Daily Living Home Assistive Devices/Equipment: None ADL Screening (condition at time of admission) Patient's cognitive ability adequate to safely complete daily activities?: Yes Is the patient deaf or have difficulty hearing?: No Does the patient have difficulty seeing, even when wearing glasses/contacts?: No Does the patient have difficulty concentrating, remembering, or making decisions?: No Patient able to express need for assistance with ADLs?: Yes Does the patient have difficulty dressing or bathing?: No Independently performs ADLs?: Yes (appropriate for developmental age) Does the patient have difficulty walking or climbing stairs?: No Weakness of Legs: None Weakness of Arms/Hands: None  Permission Sought/Granted                  Emotional Assessment Appearance:: Appears stated age Attitude/Demeanor/Rapport: Gracious Affect (typically observed): Calm        Admission diagnosis:  Aspiration pneumonia (HCC) [J69.0] Acute  respiratory failure with hypoxia (HCC) [J96.01] Opiate overdose, accidental or unintentional, initial encounter (HCC) [T40.601A] Aspiration pneumonia of left lung, unspecified aspiration pneumonia type, unspecified part of lung (HCC) [J69.0] Patient Active Problem List   Diagnosis Date Noted  . Homeless 02/22/2021  . Sepsis (HCC) 05/07/2018  . ARF (acute renal failure) (HCC) 05/07/2018  . Hyperglycemia 05/07/2018  . RUE weakness 05/07/2018  . Rhabdomyolysis 05/07/2018  . Acute respiratory failure with hypoxia (HCC) 05/06/2018  . Acute hypoxemic respiratory failure (HCC) 09/23/2017  . Heroin overdose (HCC) 09/23/2017  . Drug overdose 05/14/2014  . Heroin abuse (HCC) 05/14/2014  . Aspiration pneumonia (HCC) 05/14/2014  . AKI (acute kidney injury) (HCC) 05/14/2014  . Unresponsive 05/14/2014   PCP:  Patient, No Pcp Per (Inactive) Pharmacy:   [facility] [phone] - [place], [place] - [address] AT [facility] [address] [place] [address] Phone: [phone] Fax: [phone]  [id number] - [place], [place] - [address] AT [facility] [address] [place] [address] Phone: [phone] Fax: [phone]     Social Determinants of Health (SDOH) Interventions    Readmission Risk Interventions No flowsheet data found.

## 2021-03-13 ENCOUNTER — Other Ambulatory Visit: Payer: Self-pay

## 2021-03-13 ENCOUNTER — Emergency Department (HOSPITAL_COMMUNITY)
Admission: EM | Admit: 2021-03-13 | Discharge: 2021-03-14 | Disposition: A | Discharge status: Home / Self Care | Payer: Self-pay | Attending: Emergency Medicine | Admitting: Emergency Medicine

## 2021-03-13 DIAGNOSIS — R464 Slowness and poor responsiveness: Secondary | ICD-10-CM | POA: Insufficient documentation

## 2021-03-13 DIAGNOSIS — R0602 Shortness of breath: Secondary | ICD-10-CM | POA: Insufficient documentation

## 2021-03-13 DIAGNOSIS — Z87891 Personal history of nicotine dependence: Secondary | ICD-10-CM | POA: Insufficient documentation

## 2021-03-13 DIAGNOSIS — X58XXXA Exposure to other specified factors, initial encounter: Secondary | ICD-10-CM | POA: Insufficient documentation

## 2021-03-13 DIAGNOSIS — T401X4A Poisoning by heroin, undetermined, initial encounter: Secondary | ICD-10-CM | POA: Insufficient documentation

## 2021-03-13 MED ORDER — NALOXONE HCL 4 MG/0.1ML NA LIQD
1.0000 | Freq: Once | NASAL | Status: AC
  Administered 2021-03-14: 1 via NASAL
  Filled 2021-03-13: qty 4

## 2021-03-13 NOTE — ED Provider Notes (Signed)
Care assumed at shift change from Dr. Charm Barges pending monitoring and reevaluation.  Patient presenting as accidental heroin overdose.  He was given 1 mg of intranasal Narcan by fire department and another half of a milligram of IV Narcan by EMS.  He was arousable to voice and following commands on arrival to the ED.  Plan to continue to monitor, readdress intent. Physical Exam  BP (!) 102/59 (BP Location: Right Arm)   Pulse 99   Temp 97.8 F (36.6 C) (Oral)   Resp 16   Ht 5\' 9"  (1.753 m)   Wt 65 kg   SpO2 94%   BMI 21.16 kg/m   Physical Exam Vitals and nursing note reviewed.  Constitutional:      Appearance: He is well-developed.     Comments: Disheveled, poor hygiene.  Alert and mentating normally.  HENT:     Head: Normocephalic and atraumatic.  Eyes:     Conjunctiva/sclera: Conjunctivae normal.  Cardiovascular:     Rate and Rhythm: Normal rate.  Pulmonary:     Effort: Pulmonary effort is normal.  Abdominal:     Palpations: Abdomen is soft.  Skin:    General: Skin is warm.  Neurological:     Mental Status: He is alert.  Psychiatric:        Mood and Affect: Mood normal.     ED Course/Procedures   Clinical Course as of 03/13/21 2318  Sat Mar 13, 2021  2220 Pt alert and conversant. States he did not intentionally overdose. Has a new heroin dealer. Denies other drug or etoh use. BP low normal.  [JR]    Clinical Course User Index [JR] Aahil Fredin, [address], PA-C    Procedures  MDM  Patient monitored for 3 hours, is now alert, requesting food and drink.  States he had no intent of self-harm.  He has a new drug dealer this is caused 2 overdoses now within the last month.  Denies any other illicit drug or alcohol use.  States he does have 1 dose of Narcan to use as needed, he is given take-home dose here as well.  Outpatient resources provided.     Kamari Bilek, [place] N, PA-C 03/13/21 2320    03/15/21, MD 03/14/21 [phone]

## 2021-03-13 NOTE — ED Provider Notes (Signed)
[facility] EMERGENCY DEPARTMENT Provider Note   CSN: [id number] Arrival date & time: 03/13/21  2020     History Chief Complaint  Patient presents with  . Drug Overdose    Pt found unresponsive from heroin OD    Gerald Oliver is a 30 y.o. male.  He is brought in by EMS [place].  He was noted to have minimal respirations and to be dusky so was given 1 mg intranasal Narcan by fire department.  EMS bagged him for a few minutes and gave him another half a milligram IV Narcan.  Patient is now arousable to voice and following commands.  Nasal trumpet in place.  Patient admits to heroin IV.  He was admitted earlier this month for aspiration pneumonia.  He did not answer when asked if he was trying to hurt himself  The history is provided by the patient and the EMS personnel.  Drug Overdose This is a recurrent problem. The current episode started less than 1 hour ago. The problem has been gradually improving. Associated symptoms include shortness of breath. Pertinent negatives include no chest pain, no abdominal pain and no headaches. Nothing aggravates the symptoms. Relieved by: narcan. He has tried nothing for the symptoms. The treatment provided moderate (to narcan) relief.       Past Medical History:  Diagnosis Date  . Medical history non-contributory     Patient Active Problem List   Diagnosis Date Noted  . Homeless 02/22/2021  . Sepsis (HCC) 05/07/2018  . ARF (acute renal failure) (HCC) 05/07/2018  . Hyperglycemia 05/07/2018  . RUE weakness 05/07/2018  . Rhabdomyolysis 05/07/2018  . Acute respiratory failure with hypoxia (HCC) 05/06/2018  . Acute hypoxemic respiratory failure (HCC) 09/23/2017  . Heroin overdose (HCC) 09/23/2017  . Drug overdose 05/14/2014  . Heroin abuse (HCC) 05/14/2014  . Aspiration pneumonia (HCC) 05/14/2014  . AKI (acute kidney injury) (HCC) 05/14/2014  . Unresponsive 05/14/2014    Past  Surgical History:  Procedure Laterality Date  . WISDOM TOOTH EXTRACTION         Family History  Family history unknown: Yes    Social History   Tobacco Use  . Smoking status: Former Smoker    Types: Cigarettes    Quit date: 05/14/2008    Years since quitting: 12.8  . Smokeless tobacco: Never Used  Substance Use Topics  . Alcohol use: No    Comment: rarely  . Drug use: Yes    Comment: heroin    Home Medications Prior to Admission medications   Not on File    Allergies    Patient has no known allergies.  Review of Systems   Review of Systems  Constitutional: Negative for fever.  HENT: Negative for sore throat.   Eyes: Negative for visual disturbance.  Respiratory: Positive for shortness of breath.   Cardiovascular: Negative for chest pain.  Gastrointestinal: Negative for abdominal pain.  Genitourinary: Negative for dysuria.  Musculoskeletal: Positive for myalgias.  Skin: Positive for pallor.  Neurological: Negative for headaches.    Physical Exam Updated Vital Signs There were no vitals taken for this visit.  Physical Exam Vitals and nursing note reviewed.  Constitutional:      Appearance: Normal appearance. He is well-developed.  HENT:     Head: Normocephalic and atraumatic.  Eyes:     Conjunctiva/sclera: Conjunctivae normal.  Cardiovascular:     Rate and Rhythm: Normal rate and regular rhythm.  Heart sounds: No murmur heard.   Pulmonary:     Effort: Pulmonary effort is normal. No respiratory distress.     Breath sounds: Normal breath sounds.  Abdominal:     Palpations: Abdomen is soft.     Tenderness: There is no abdominal tenderness.  Musculoskeletal:        General: Normal range of motion.     Cervical back: Neck supple.  Skin:    General: Skin is warm and dry.     Capillary Refill: Capillary refill takes less than 2 seconds.  Neurological:     General: No focal deficit present.     Mental Status: He is alert.     Comments: Patient  is awake.  Moving all extremities nonfocally.  Asking for something to drink.     ED Results / Procedures / Treatments   Labs (all labs ordered are listed, but only abnormal results are displayed) Labs Reviewed - No data to display  EKG EKG Interpretation  Date/Time:  Saturday Mar 13 2021 20:24:03 EDT Ventricular Rate:  94 PR Interval:  160 QRS Duration: 78 QT Interval:  323 QTC Calculation: 404 R Axis:   74 Text Interpretation: Sinus rhythm No significant change since prior 5/22 Confirmed by Meridee Score [phone]) on 03/13/2021 8:25:06 PM   Radiology No results found.  Procedures Procedures   Medications Ordered in ED Medications - No data to display  ED Course  I have reviewed the triage vital signs and the nursing notes.  Pertinent labs & imaging results that were available during my care of the patient were reviewed by me and considered in my medical decision making (see chart for details).    MDM Rules/Calculators/A&P                         Patient signed out to PA [facility] to observe for a few hours until the Narcan wears off.  Final Clinical Impression(s) / ED Diagnoses Final diagnoses:  None    Rx / DC Orders ED Discharge Orders    None       Terrilee Files, MD 03/13/21 2047

## 2021-03-13 NOTE — ED Triage Notes (Signed)
[place] found in woods unresponsive by bystanders. Pt was given 1 mg nasally, and 0.5mg  iv. Pt is AAOx4 while EMS was dropping the pt off. Pt denies any pain.

## 2021-03-13 NOTE — Discharge Instructions (Addendum)
Please consider detox. °

## 2021-06-17 ENCOUNTER — Encounter (HOSPITAL_COMMUNITY): Payer: Self-pay | Admitting: Emergency Medicine

## 2021-06-17 ENCOUNTER — Emergency Department (HOSPITAL_COMMUNITY): Payer: Self-pay

## 2021-06-17 ENCOUNTER — Inpatient Hospital Stay (HOSPITAL_COMMUNITY)
Admission: EM | Admit: 2021-06-17 | Discharge: 2021-07-19 | DRG: 028 | Disposition: A | Discharge status: Home / Self Care | Payer: Self-pay | Attending: Internal Medicine | Admitting: Internal Medicine

## 2021-06-17 DIAGNOSIS — G061 Intraspinal abscess and granuloma: Principal | ICD-10-CM | POA: Diagnosis present

## 2021-06-17 DIAGNOSIS — T401X1A Poisoning by heroin, accidental (unintentional), initial encounter: Secondary | ICD-10-CM | POA: Diagnosis present

## 2021-06-17 DIAGNOSIS — G8929 Other chronic pain: Secondary | ICD-10-CM | POA: Diagnosis present

## 2021-06-17 DIAGNOSIS — Z800 Family history of malignant neoplasm of digestive organs: Secondary | ICD-10-CM

## 2021-06-17 DIAGNOSIS — Z560 Unemployment, unspecified: Secondary | ICD-10-CM

## 2021-06-17 DIAGNOSIS — Z634 Disappearance and death of family member: Secondary | ICD-10-CM

## 2021-06-17 DIAGNOSIS — Z5900 Homelessness unspecified: Secondary | ICD-10-CM

## 2021-06-17 DIAGNOSIS — U071 COVID-19: Secondary | ICD-10-CM | POA: Diagnosis present

## 2021-06-17 DIAGNOSIS — T40605A Adverse effect of unspecified narcotics, initial encounter: Secondary | ICD-10-CM | POA: Diagnosis present

## 2021-06-17 DIAGNOSIS — B9689 Other specified bacterial agents as the cause of diseases classified elsewhere: Secondary | ICD-10-CM | POA: Diagnosis present

## 2021-06-17 DIAGNOSIS — Z8661 Personal history of infections of the central nervous system: Secondary | ICD-10-CM

## 2021-06-17 DIAGNOSIS — Z419 Encounter for procedure for purposes other than remedying health state, unspecified: Secondary | ICD-10-CM

## 2021-06-17 DIAGNOSIS — R1310 Dysphagia, unspecified: Secondary | ICD-10-CM | POA: Diagnosis present

## 2021-06-17 DIAGNOSIS — F1120 Opioid dependence, uncomplicated: Secondary | ICD-10-CM | POA: Diagnosis present

## 2021-06-17 DIAGNOSIS — F5104 Psychophysiologic insomnia: Secondary | ICD-10-CM | POA: Diagnosis present

## 2021-06-17 DIAGNOSIS — F1910 Other psychoactive substance abuse, uncomplicated: Secondary | ICD-10-CM

## 2021-06-17 DIAGNOSIS — Z9152 Personal history of nonsuicidal self-harm: Secondary | ICD-10-CM

## 2021-06-17 DIAGNOSIS — Z87891 Personal history of nicotine dependence: Secondary | ICD-10-CM

## 2021-06-17 DIAGNOSIS — M4802 Spinal stenosis, cervical region: Secondary | ICD-10-CM | POA: Diagnosis present

## 2021-06-17 DIAGNOSIS — M62838 Other muscle spasm: Secondary | ICD-10-CM | POA: Diagnosis not present

## 2021-06-17 DIAGNOSIS — Z765 Malingerer [conscious simulation]: Secondary | ICD-10-CM

## 2021-06-17 DIAGNOSIS — K5903 Drug induced constipation: Secondary | ICD-10-CM | POA: Diagnosis present

## 2021-06-17 DIAGNOSIS — F1110 Opioid abuse, uncomplicated: Secondary | ICD-10-CM | POA: Diagnosis present

## 2021-06-17 DIAGNOSIS — F32A Depression, unspecified: Secondary | ICD-10-CM | POA: Diagnosis present

## 2021-06-17 DIAGNOSIS — D696 Thrombocytopenia, unspecified: Secondary | ICD-10-CM | POA: Diagnosis present

## 2021-06-17 DIAGNOSIS — R079 Chest pain, unspecified: Secondary | ICD-10-CM

## 2021-06-17 DIAGNOSIS — F411 Generalized anxiety disorder: Secondary | ICD-10-CM | POA: Diagnosis present

## 2021-06-17 DIAGNOSIS — G062 Extradural and subdural abscess, unspecified: Secondary | ICD-10-CM | POA: Diagnosis present

## 2021-06-17 DIAGNOSIS — M4622 Osteomyelitis of vertebra, cervical region: Secondary | ICD-10-CM | POA: Diagnosis present

## 2021-06-17 HISTORY — DX: Opioid dependence, uncomplicated: F11.20

## 2021-06-17 LAB — TROPONIN I (HIGH SENSITIVITY)
Troponin I (High Sensitivity): 11 ng/L (ref <18)
Troponin I (High Sensitivity): 9 ng/L (ref <18)

## 2021-06-17 LAB — COMPREHENSIVE METABOLIC PANEL
ALT: 9 U/L (ref 0–44)
AST: 13 U/L — ABNORMAL LOW (ref 15–41)
Albumin: 3.1 g/dL — ABNORMAL LOW (ref 3.5–5.0)
Alkaline Phosphatase: 83 U/L (ref 38–126)
Anion gap: 8 (ref 5–15)
BUN: 12 mg/dL (ref 6–20)
CO2: 26 mmol/L (ref 22–32)
Calcium: 8.9 mg/dL (ref 8.9–10.3)
Chloride: 99 mmol/L (ref 98–111)
Creatinine, Ser: 0.78 mg/dL (ref 0.61–1.24)
GFR, Estimated: >60 mL/min (ref >60)
Glucose, Bld: 122 mg/dL — ABNORMAL HIGH (ref 70–99)
Potassium: 4 mmol/L (ref 3.5–5.1)
Sodium: 133 mmol/L — ABNORMAL LOW (ref 135–145)
Total Bilirubin: 0.8 mg/dL (ref 0.3–1.2)
Total Protein: 7.1 g/dL (ref 6.5–8.1)

## 2021-06-17 LAB — CBC WITH DIFFERENTIAL/PLATELET
Abs Immature Granulocytes: 0.19 10*3/uL — ABNORMAL HIGH (ref 0.00–0.07)
Basophils Absolute: 0 10*3/uL (ref 0.0–0.1)
Basophils Relative: 0 %
Eosinophils Absolute: 0 10*3/uL (ref 0.0–0.5)
Eosinophils Relative: 0 %
HCT: 36.7 % — ABNORMAL LOW (ref 39.0–52.0)
Hemoglobin: 11.9 g/dL — ABNORMAL LOW (ref 13.0–17.0)
Immature Granulocytes: 2 %
Lymphocytes Relative: 16 %
Lymphs Abs: 2 10*3/uL (ref 0.7–4.0)
MCH: 26.7 pg (ref 26.0–34.0)
MCHC: 32.4 g/dL (ref 30.0–36.0)
MCV: 82.3 fL (ref 80.0–100.0)
Monocytes Absolute: 0.9 10*3/uL (ref 0.1–1.0)
Monocytes Relative: 7 %
Neutro Abs: 9.7 10*3/uL — ABNORMAL HIGH (ref 1.7–7.7)
Neutrophils Relative %: 75 %
Platelets: 104 10*3/uL — ABNORMAL LOW (ref 150–400)
RBC: 4.46 MIL/uL (ref 4.22–5.81)
RDW: 14.3 % (ref 11.5–15.5)
WBC: 12.8 10*3/uL — ABNORMAL HIGH (ref 4.0–10.5)
nRBC: 0 % (ref 0.0–0.2)

## 2021-06-17 LAB — PROTIME-INR
INR: 1 (ref 0.8–1.2)
Prothrombin Time: 12.7 seconds (ref 11.4–15.2)

## 2021-06-17 LAB — LACTIC ACID, PLASMA
Lactic Acid, Venous: 0.9 mmol/L (ref 0.5–1.9)
Lactic Acid, Venous: 0.9 mmol/L (ref 0.5–1.9)

## 2021-06-17 LAB — ETHANOL: Alcohol, Ethyl (B): <10 mg/dL (ref <10)

## 2021-06-17 LAB — RESP PANEL BY RT-PCR (FLU A&B, COVID) ARPGX2
Influenza A by PCR: NEGATIVE
Influenza B by PCR: NEGATIVE
SARS Coronavirus 2 by RT PCR: POSITIVE — AB

## 2021-06-17 MED ORDER — GADOBUTROL 1 MMOL/ML IV SOLN
6.0000 mL | Freq: Once | INTRAVENOUS | Status: AC | PRN
  Administered 2021-06-17: 6 mL via INTRAVENOUS

## 2021-06-17 MED ORDER — HYDROMORPHONE HCL 1 MG/ML IJ SOLN
1.0000 mg | Freq: Once | INTRAMUSCULAR | Status: AC
  Administered 2021-06-17: 1 mg via INTRAVENOUS
  Filled 2021-06-17: qty 1

## 2021-06-17 MED ORDER — LACTATED RINGERS IV SOLN: INTRAVENOUS | Status: DC

## 2021-06-17 MED ORDER — CLONIDINE HCL 0.1 MG PO TABS
0.1000 mg | ORAL_TABLET | Freq: Four times a day (QID) | ORAL | Status: DC
  Filled 2021-06-17: qty 1

## 2021-06-17 MED ORDER — SODIUM CHLORIDE 0.9 % IV SOLN
1.0000 g | Freq: Three times a day (TID) | INTRAVENOUS | Status: DC
  Filled 2021-06-17 (×2): qty 1

## 2021-06-17 MED ORDER — DICYCLOMINE HCL 20 MG PO TABS
20.0000 mg | ORAL_TABLET | Freq: Four times a day (QID) | ORAL | Status: AC | PRN
  Filled 2021-06-17: qty 1

## 2021-06-17 MED ORDER — ACETAMINOPHEN 325 MG PO TABS
650.0000 mg | ORAL_TABLET | Freq: Four times a day (QID) | ORAL | Status: DC | PRN
  Administered 2021-06-18 – 2021-06-23 (×5): 650 mg via ORAL
  Filled 2021-06-17 (×5): qty 2

## 2021-06-17 MED ORDER — KETOROLAC TROMETHAMINE 30 MG/ML IJ SOLN
30.0000 mg | Freq: Once | INTRAMUSCULAR | Status: AC
  Administered 2021-06-17: 30 mg via INTRAVENOUS
  Filled 2021-06-17: qty 1

## 2021-06-17 MED ORDER — ACETAMINOPHEN 650 MG RE SUPP: 650.0000 mg | Freq: Four times a day (QID) | RECTAL | Status: DC | PRN

## 2021-06-17 MED ORDER — HYDROCODONE-ACETAMINOPHEN 5-325 MG PO TABS
1.0000 | ORAL_TABLET | ORAL | Status: DC | PRN
  Administered 2021-06-18 – 2021-06-19 (×3): 2 via ORAL
  Filled 2021-06-17 (×5): qty 2

## 2021-06-17 MED ORDER — VANCOMYCIN HCL 1250 MG/250ML IV SOLN
1250.0000 mg | Freq: Once | INTRAVENOUS | Status: AC
  Administered 2021-06-17: 1250 mg via INTRAVENOUS
  Filled 2021-06-17: qty 250

## 2021-06-17 MED ORDER — MORPHINE SULFATE (PF) 4 MG/ML IV SOLN
4.0000 mg | INTRAVENOUS | Status: DC | PRN
  Administered 2021-06-17 – 2021-06-19 (×9): 4 mg via INTRAVENOUS
  Filled 2021-06-17 (×9): qty 1

## 2021-06-17 MED ORDER — DOCUSATE SODIUM 100 MG PO CAPS
100.0000 mg | ORAL_CAPSULE | Freq: Two times a day (BID) | ORAL | Status: DC
Start: 2021-06-17 — End: 2021-06-22
  Administered 2021-06-18 – 2021-06-21 (×5): 100 mg via ORAL
  Filled 2021-06-17 (×9): qty 1

## 2021-06-17 MED ORDER — BISACODYL 5 MG PO TBEC
5.0000 mg | DELAYED_RELEASE_TABLET | Freq: Every day | ORAL | Status: DC | PRN
  Administered 2021-06-20: 5 mg via ORAL
  Filled 2021-06-17: qty 1

## 2021-06-17 MED ORDER — ONDANSETRON HCL 4 MG/2ML IJ SOLN: 4.0000 mg | Freq: Four times a day (QID) | INTRAMUSCULAR | Status: DC | PRN

## 2021-06-17 MED ORDER — LORAZEPAM 2 MG/ML IJ SOLN
1.0000 mg | Freq: Once | INTRAMUSCULAR | Status: AC | PRN
  Administered 2021-06-17: 1 mg via INTRAVENOUS
  Filled 2021-06-17: qty 1

## 2021-06-17 MED ORDER — SODIUM CHLORIDE 0.9% FLUSH
3.0000 mL | Freq: Two times a day (BID) | INTRAVENOUS | Status: DC
  Administered 2021-06-17 – 2021-07-19 (×63): 3 mL via INTRAVENOUS

## 2021-06-17 MED ORDER — POLYETHYLENE GLYCOL 3350 17 G PO PACK
17.0000 g | PACK | Freq: Every day | ORAL | Status: DC | PRN
  Administered 2021-06-19 – 2021-06-20 (×2): 17 g via ORAL
  Filled 2021-06-17 (×2): qty 1

## 2021-06-17 MED ORDER — SODIUM CHLORIDE 0.9 % IV SOLN
2.0000 g | Freq: Three times a day (TID) | INTRAVENOUS | Status: DC
  Administered 2021-06-17 – 2021-06-18 (×3): 2 g via INTRAVENOUS
  Filled 2021-06-17 (×8): qty 2

## 2021-06-17 MED ORDER — HYDRALAZINE HCL 20 MG/ML IJ SOLN: 5.0000 mg | INTRAMUSCULAR | Status: DC | PRN

## 2021-06-17 MED ORDER — HYDROXYZINE HCL 25 MG PO TABS
25.0000 mg | ORAL_TABLET | Freq: Four times a day (QID) | ORAL | Status: AC | PRN
  Administered 2021-06-19: 25 mg via ORAL
  Filled 2021-06-17: qty 1

## 2021-06-17 MED ORDER — VANCOMYCIN HCL 1250 MG/250ML IV SOLN
1250.0000 mg | Freq: Two times a day (BID) | INTRAVENOUS | Status: DC
  Administered 2021-06-18: 1250 mg via INTRAVENOUS
  Filled 2021-06-17: qty 250

## 2021-06-17 MED ORDER — CLONIDINE HCL 0.1 MG PO TABS: 0.1000 mg | ORAL_TABLET | Freq: Every day | ORAL | Status: DC

## 2021-06-17 MED ORDER — ONDANSETRON HCL 4 MG PO TABS: 4.0000 mg | ORAL_TABLET | Freq: Four times a day (QID) | ORAL | Status: DC | PRN

## 2021-06-17 MED ORDER — CLONIDINE HCL 0.1 MG PO TABS: 0.1000 mg | ORAL_TABLET | ORAL | Status: DC

## 2021-06-17 NOTE — H&P (Signed)
History and Physical    Gerald Oliver LEX:[id number] DOB: 19-Jul-1991 DOA: 06/17/2021  PCP: Patient, No Pcp Per (Inactive) Consultants:  None Patient coming from: Homeless; NOK: None *Note: his brother, Gerald Fus, is also currently hospitalized with the same issue.  With the patient's permission, I notified Gerald Fus of his hospitalization and need for surgery and encouraged him to visit the patient once he is discharged - anticipated to be next week.*  Chief Complaint: upper back pain  HPI: Gerald Oliver is a 30 y.o. male with medical history significant of IVDA and prior heroin overdose in 02/2021 presenting with upper back pain.  He reports onset of pain last night.  +subjective fevers.  He occasionally smokes tobacco and marijuana and occasionally drinks alcohol.  He uses heroin routinely, last use 2 days ago.  He moved to [place] at age 32-11 and dropped out of school shortly in 02/01/23 grade.  His father died when he was 89.  His mother died of esophageal cancer in 02/08/2017.  He and his brother are both homeless and heroin abusers.  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     ED Course: IVDA.  Started with neck pain 2 days ago.  Epidural abscess on imaging.  No fever.  Normal lactate, mild leukocytosis.  On Vanc/Meropenem.  Needs to be NPO until seen by neurosurgery Conchita Paris).  Review of Systems: As per HPI; otherwise review of systems reviewed and negative.   Ambulatory Status:  Ambulates without assistance  COVID Vaccine Status:  None  Past Medical History:  Diagnosis Date   Accidental overdose of heroin (HCC) 02/2021   Heroin dependence (HCC)     Past Surgical History:  Procedure Laterality Date   WISDOM TOOTH EXTRACTION      Social History   Socioeconomic History   Marital status: Single    Spouse name: Not on file   Number of children: Not on file   Years of education: Not on file   Highest education level: Not on file  Occupational History   Occupation:  unemployed  Tobacco Use   Smoking status: Some Days    Types: Cigarettes   Smokeless tobacco: Never  Substance and Sexual Activity   Alcohol use: Yes    Comment: rarely   Drug use: Yes    Types: Heroin, Marijuana    Comment: heroin   Sexual activity: Yes  Other Topics Concern   Not on file  Social History Narrative   Not on file   Social Determinants of Health   Financial Resource Strain: Not on file  Food Insecurity: Not on file  Transportation Needs: Not on file  Physical Activity: Not on file  Stress: Not on file  Social Connections: Not on file  Intimate Partner Violence: Not on file    No Known Allergies  Family History  Problem Relation Age of Onset   Cancer Mother     Prior to Admission medications   Not on File    Physical Exam: Vitals:   06/17/21 0741 06/17/21 1321  BP: (!) 147/97 (!) 141/89  Pulse: 76 71  Resp: 16 17  Temp: 99.8 F (37.7 C)   TempSrc: Oral   SpO2: 100% 99%     General:  Appears disheveled and miserable Eyes:   EOMI, normal lids, iris; tattoo adjacent to R lateral eye ENT:  grossly normal hearing, lips & tongue, mmm; appropriate dentition Neck:  no LAD, masses or thyromegaly Cardiovascular:  RRR, no  m/r/g. No LE edema.  Respiratory:   CTA bilaterally with no wheezes/rales/rhonchi.  Normal respiratory effort. Abdomen:  soft, NT, ND Back:   normal alignment, no CVAT Skin:  diffuse excoriations along his B forearms and hands from injecting Musculoskeletal:  grossly normal tone BUE/BLE, no bony abnormality Lower extremity:  No LE edema.  Limited foot exam with no ulcerations.  2+ distal pulses. Psychiatric:  flat mood and affect, speech fluent and appropriate, AOx3 Neurologic:  CN 2-12 grossly intact, moves all extremities in coordinated fashion    Radiological Exams on Admission: Independently reviewed - see discussion in A/P where applicable  DG Chest 1 View  Result Date: 06/17/2021 CLINICAL DATA:  Neck pain for 2 days.   Pain in chest. EXAM: CHEST  1 VIEW COMPARISON:  02/22/2021 FINDINGS: The heart size and mediastinal contours are within normal limits. Both lungs are clear. The visualized skeletal structures are unremarkable. IMPRESSION: No active disease. Electronically Signed   By: Signa Kellaylor  Stroud M.D.   On: 06/17/2021 10:51   DG Cervical Spine Complete  Result Date: 06/17/2021 CLINICAL DATA:  Neck pain since last night on the right side EXAM: CERVICAL SPINE - COMPLETE 4+ VIEW COMPARISON:  Cervical spine radiographs 06/15/2012 FINDINGS: Vertebral body heights are preserved. There is no evidence of acute fracture. Alignment is normal. The neural foramina appear patent. The prevertebral soft tissues are unremarkable. IMPRESSION: Unremarkable cervical spine radiographs. Electronically Signed   By: Lesia HausenPeter  Noone M.D.   On: 06/17/2021 08:54   MR Cervical Spine W or Wo Contrast  Result Date: 06/17/2021 CLINICAL DATA:  Cervical radiculopathy, infection suspected. Additional history provided: Neck pain for 2 days, cervical radiculopathy, mid back pain, infection suspected. EXAM: MRI CERVICAL SPINE WITHOUT AND WITH CONTRAST TECHNIQUE: Multiplanar and multiecho pulse sequences of the cervical spine, to include the craniocervical junction and cervicothoracic junction, were obtained without and with intravenous contrast. CONTRAST:  6mL GADAVIST GADOBUTROL 1 MMOL/ML IV SOLN COMPARISON:  Cervical spine radiographs 06/17/2021. CT of the cervical spine 10/22/2010. FINDINGS: Mild intermittent motion degradation. Alignment: Straightening of the expected cervical lordosis. No significant spondylolisthesis. Vertebrae: Vertebral body height is maintained. No significant marrow edema or focal suspicious osseous lesion. Cord: No spinal cord signal abnormality. Contact upon the ventral spinal cord at C5-C6, as described below. Posterior Fossa, vertebral arteries, paraspinal tissues: No abnormality identified within included portions of the posterior  fossa. Flow voids preserved within the imaged cervical vertebral arteries. There is abnormal enhancement ventral to the vertebral bodies greatest at the C4-C6 levels. Additionally, there is abnormal enhancement within the ventral aspect of the spinal canal, also most notably at the C4-C6 levels. These findings are highly suspicious for epidural and paraspinal phlegmon (best appreciated on series 19, image 6). There is resultant spinal canal narrowing at the C5-C6 level, as described below. Disc levels: Mild multilevel disc degeneration, greatest at C5-C6 and C6-C7. C2-C3: No significant disc herniation or stenosis. C3-C4: Minimal uncovertebral hypertrophy (greater on the right). No significant disc herniation or stenosis. C4-C5: Minimal uncovertebral hypertrophy (greater on the right). No significant disc herniation or spinal canal stenosis at the disc level. Abnormal enhancing soft tissue within the ventral aspect of the spinal canal at the C5 vertebral body level, highly suspicious for phlegmon, contributing to mild/moderate spinal canal stenosis with contact upon the ventral spinal cord. C5-C6: Disc bulge with bilateral uncovertebral hypertrophy. Superimposed central disc extrusion with cranial migration to the lower C5 vertebral body level (series 19, image 5). Superimposed abnormal enhancing soft tissue within  the ventral aspect of the spinal canal highly suspicious for phlegmon, contributing to multifactorial moderate spinal canal stenosis, contacting and minimally flattening the ventral aspect of the spinal cord. No significant degenerative foraminal stenosis. However, abnormal enhancement suspicious for phlegmon extends into the bilateral neural foramina (series 20, image 16). C6-C7: Disc bulge asymmetric to the right with endplate spurring and right-sided uncovertebral hypertrophy. Superimposed abnormal enhancing soft tissue within the ventral aspect of the spinal canal highly suspicious for epidural  phlegmon. Mild relative spinal canal narrowing (without spinal cord mass effect). Moderate degenerative right neural foraminal narrowing. No significant left foraminal stenosis. C7-T1: Mild facet arthrosis. No significant disc herniation or stenosis. IMPRESSION: Abnormal enhancement ventral to the cervical vertebral bodies and within the ventral aspect of the spinal canal, greatest at the C4-C6 levels, and highly suspicious for soft tissue infection with epidural/paraspinal phlegmon. Superimposed cervical spondylosis, including a cranially migrated central disc extrusion at C5-C6. Multilevel spinal canal stenosis. Most notably at C5-C6, a disc herniation and suspected ventral epidural phlegmon contribute to moderate spinal canal stenosis, contacting and mildly flattening the ventral spinal cord. Abnormal enhancement also suspicious for phlegmon extends into the bilateral C5-C6 neural foramina. At C6-C7, there is moderate degenerative right foraminal stenosis. Electronically Signed   By: Jackey Loge D.O.   On: 06/17/2021 13:17   MR THORACIC SPINE W WO CONTRAST  Result Date: 06/17/2021 CLINICAL DATA:  Epidural abscess; IV drug abuse, rule out epidural abscess. Additional history provided: Neck pain for 2 days. Cervical radiculopathy, mid back pain, infection suspected. EXAM: MRI THORACIC WITHOUT AND WITH CONTRAST TECHNIQUE: Multiplanar and multiecho pulse sequences of the thoracic spine were obtained without and with intravenous contrast. CONTRAST:  61mL GADAVIST GADOBUTROL 1 MMOL/ML IV SOLN COMPARISON:  Chest CT 05/07/2018. FINDINGS: Alignment:  No significant spondylolisthesis. Vertebrae: Redemonstrated mild chronic T7 and T8 vertebral compression deformities. Trace edema along the T8 inferior endplate appears degenerative. Elsewhere, no significant marrow edema is identified within the thoracic spine. T12 inferior endplate Schmorl node. Cord:  No spinal cord signal abnormality is identified. Paraspinal and other  soft tissues: No abnormality identified within included portions of the thorax or upper abdomen/retroperitoneum. Paraspinal soft tissues unremarkable. No abnormal enhancement within the thoracic spinal canal. Disc levels: Intervertebral disc height and hydration are maintained. Small T2-T3 central disc protrusion (without significant spinal canal stenosis). No significant disc herniation, spinal canal stenosis or neural foraminal narrowing at the remaining levels. IMPRESSION: No evidence of discitis/osteomyelitis or paraspinal soft tissue infection at the thoracic levels. Minimal edema along the T8 inferior endplate appears degenerative. Redemonstrated mild chronic T7 and T8 vertebral compression deformities. Small central disc protrusion at T2-T3 without significant spinal canal stenosis. Electronically Signed   By: Jackey Loge D.O.   On: 06/17/2021 13:24    EKG: pending   Labs on Admission: I have personally reviewed the available labs and imaging studies at the time of the admission.  Pertinent labs:   Na++ 133 Glucose 122 Albumin 3.1 HS troponin 11, 9 Lactate 0.9, 0.9 WBC 12.8 Hgb 11.9 Platelets 104 INR 1.0 ETOH <10   Assessment/Plan Principal Problem:   Epidural abscess Active Problems:   Heroin abuse (HCC)   Homeless   Epidural abscess -Patient presenting with neck/upper back pain -CT with concern for epidural abscess -Will need antibiotics for 4-6 weeks. -Further important considerations include nutrition, tobacco cessation. -Will request neurosurgical consultation. -Vertebral osteo is more common in the L-spine and usually presents with severe pain; his is present in the C-spine. -Will  admit with Meropenem and Vanc -Patient has agreed to the go to the OR tonight  Heroin abuse -Long-standing heroin dependence -He reports a desire to quit and recognizes that ongoing use is life-threatening -Recommend suboxone therapy once the patient is stable post-operatively -Will  monitor on COWS protocol  -TOC team consult for substance abuse education/support -prn orders from the Clonidine withdrawal order set were also ordered. -If blood cultures are positive (which would not be unexpected), will need echo and/or TEE to assess for endocarditis -UDS is pending  Homelessness -Patient desires to enter a treatment program upon discharge from the hospital -He understands that he is likely to require 4-6 weeks of inpatient antibiotics here in the hospital and has been strongly discouraged about leaving AMA prior to the completion of treatment -TOC team consulted      Note: This patient has been tested and is negative for the novel coronavirus COVID-19. The patient has been fully vaccinated against COVID-19.   Level of care: Progressive DVT prophylaxis:  SCDs Code Status:  Full  Family Communication: None present; I spoke with the patient's brother (who is similarly actively hospitalized) by telephone at the time of admission. Disposition Plan:  The patient is from: homeless  Anticipated d/c is to: be determined  Anticipated d/c date will depend on clinical response to treatment, likely 4-6 weeks  Patient is currently: acutely ill Consults called: Neurosurgery; [facility] team  Admission status:  Admit - It is my clinical opinion that admission to INPATIENT is reasonable and necessary because of the expectation that this patient will require hospital care that crosses at least 2 midnights to treat this condition based on the medical complexity of the problems presented.  Given the aforementioned information, the predictability of an adverse outcome is felt to be significant.    Jonah Blue MD [facility]   How to contact the [facility] Attending or Consulting provider 7A - 7P or covering provider during after hours 7P -7A, for this patient?  Check the care team in [facility] and look for a) attending/consulting [facility] provider listed and b) the [facility] team listed Log into  [email or web address] and use [facility]'s universal password to access. If you do not have the password, please contact the hospital operator. Locate the [facility] provider you are looking for under [facility] and page to a number that you can be directly reached. If you still have difficulty reaching the provider, please page the [facility] (Director on Call) for the Hospitalists listed on amion for assistance.   06/17/2021, 6:00 PM

## 2021-06-17 NOTE — ED Notes (Signed)
Patient transported to MRI 

## 2021-06-17 NOTE — ED Notes (Addendum)
Pt was given a mask to wear in the hallway due to positive COVID results. Pt refused to wear the mask "stating I am moving around too much to wear that"

## 2021-06-17 NOTE — ED Notes (Signed)
Patient transported to X-ray 

## 2021-06-17 NOTE — ED Notes (Signed)
Pt refused 12- lead. Stating he can not lay on his back.

## 2021-06-17 NOTE — ED Provider Notes (Signed)
I received a call from Dr. Conchita Paris, neurosurgery unfortunately based on the caseload in the OR he is unable to take the patient to the OR today and plans to take him tomorrow afternoon.  Recommended the patient be allowed to eat and drink this evening.  We will make him n.p.o. at midnight.   Melene Plan, DO 06/17/21 1940

## 2021-06-17 NOTE — Progress Notes (Signed)
Pharmacy Antibiotic Note  Gerald Oliver is a 30 y.o. male admitted on 06/17/2021 with  spinal epidural abscess .  Pharmacy has been consulted for Meropenem and vancomycin dosing.  WBC elevated. SCr wnl   Plan: -Meropenem 2 gm IV Q 8 hours  -Vancomycin 1250 mg IV Q 12 hrs. Goal AUC 400-550. Expected AUC: 493 SCr used: 0.78 -Monitor CBC, renal fx, cultures and clinical progress -Vanc levels as indicated       Temp (24hrs), Avg:99.8 F (37.7 C), Min:99.8 F (37.7 C), Max:99.8 F (37.7 C)  Recent Labs  Lab 06/17/21 1007 06/17/21 1009 06/17/21 1041  WBC 12.8*  --   --   CREATININE 0.78  --   --   LATICACIDVEN  --  0.9 0.9    CrCl cannot be calculated (Unknown ideal weight.).    No Known Allergies  Antimicrobials this admission: Meropenem 9/1 >>  Vanc 9/1 >>   Dose adjustments this admission:   Microbiology results: 9/1 BCx:   Thank you for allowing pharmacy to be a part of this patient's care.  Vinnie Level, PharmD., BCPS, BCCCP Clinical Pharmacist Please refer to [facility] for unit-specific pharmacist

## 2021-06-17 NOTE — ED Provider Notes (Signed)
[facility] EMERGENCY DEPARTMENT Provider Note   CSN: [id number] Arrival date & time: 06/17/21  9798     History No chief complaint on file.   Gerald Oliver is a 30 y.o. male.  HPI Patient reports he has had severe neck pain for 2 days.  He denies any injury or known precipitating event.  He reports he is got a very sharp deep aching pain at the base of his neck and the top of his back.  He reports pain is severe if he moves his head.  He reports he feels some tingling and aching about to his elbows on both sides.  Reports he also feels some chest pain.  Is a diffuse aching.  He reports that he has been nauseated and he has not been able to eat or sleep for about 2 days.  He believes he has had a fever but has not had a thermometer to measure it.  Patient does have ongoing IV drug abuse.  Reports last use was about 24 hours ago.    Past Medical History:  Diagnosis Date   Medical history non-contributory     Patient Active Problem List   Diagnosis Date Noted   Homeless 02/22/2021   Sepsis (HCC) 05/07/2018   ARF (acute renal failure) (HCC) 05/07/2018   Hyperglycemia 05/07/2018   RUE weakness 05/07/2018   Rhabdomyolysis 05/07/2018   Acute respiratory failure with hypoxia (HCC) 05/06/2018   Acute hypoxemic respiratory failure (HCC) 09/23/2017   Heroin overdose (HCC) 09/23/2017   Drug overdose 05/14/2014   Heroin abuse (HCC) 05/14/2014   Aspiration pneumonia (HCC) 05/14/2014   AKI (acute kidney injury) (HCC) 05/14/2014   Unresponsive 05/14/2014    Past Surgical History:  Procedure Laterality Date   WISDOM TOOTH EXTRACTION         Family History  Family history unknown: Yes    Social History   Tobacco Use   Smoking status: Former    Types: Cigarettes    Quit date: 05/14/2008    Years since quitting: 13.1   Smokeless tobacco: Never  Substance Use Topics   Alcohol use: No    Comment: rarely   Drug use: Yes    Comment: heroin    Home  Medications Prior to Admission medications   Not on File    Allergies    Patient has no known allergies.  Review of Systems   Review of Systems 10 systems reviewed and negative except as per HPI Physical Exam Updated Vital Signs BP (!) 141/89   Pulse 71   Temp 99.8 F (37.7 C) (Oral)   Resp 17   SpO2 99%   Physical Exam Constitutional:      Comments: Alert with clear mental status but very uncomfortable in appearance.  Patient is lying on his left side curled up on the stretcher.  He reports this is his position of comfort and does not wish to sit up.  No respiratory distress.  HENT:     Mouth/Throat:     Comments: Airway clear.  Mucous membranes moist however patient has extensive gingivitis.  No facial swelling. Eyes:     Extraocular Movements: Extraocular movements intact.     Conjunctiva/sclera: Conjunctivae normal.  Neck:     Comments: Patient endorses discomfort to palpation around C5-C6.  He reports significant pain with range of motion at this area.  No palpable abnormality.  No anterior soft tissue swelling.  No stridor. Cardiovascular:     Rate and Rhythm: Normal  rate and regular rhythm.  Pulmonary:     Effort: Pulmonary effort is normal.     Breath sounds: Normal breath sounds.  Abdominal:     General: There is no distension.     Palpations: Abdomen is soft.     Tenderness: There is no abdominal tenderness. There is no guarding.  Musculoskeletal:        General: Normal range of motion.     Right lower leg: No edema.     Left lower leg: No edema.  Skin:    General: Skin is warm and dry.  Neurological:     General: No focal deficit present.     Mental Status: He is oriented to person, place, and time.     Cranial Nerves: No cranial nerve deficit.     Motor: No weakness.     Coordination: Coordination normal.    ED Results / Procedures / Treatments   Labs (all labs ordered are listed, but only abnormal results are displayed) Labs Reviewed   COMPREHENSIVE METABOLIC PANEL - Abnormal; Notable for the following components:      Result Value   Sodium 133 (*)    Glucose, Bld 122 (*)    Albumin 3.1 (*)    AST 13 (*)    All other components within normal limits  CBC WITH DIFFERENTIAL/PLATELET - Abnormal; Notable for the following components:   WBC 12.8 (*)    Hemoglobin 11.9 (*)    HCT 36.7 (*)    Platelets 104 (*)    Neutro Abs 9.7 (*)    Abs Immature Granulocytes 0.19 (*)    All other components within normal limits  CULTURE, BLOOD (ROUTINE X 2)  CULTURE, BLOOD (ROUTINE X 2)  ETHANOL  LACTIC ACID, PLASMA  LACTIC ACID, PLASMA  PROTIME-INR  URINALYSIS, ROUTINE W REFLEX MICROSCOPIC  RAPID URINE DRUG SCREEN, HOSP PERFORMED  TROPONIN I (HIGH SENSITIVITY)  TROPONIN I (HIGH SENSITIVITY)    EKG None  Radiology DG Chest 1 View  Result Date: 06/17/2021 CLINICAL DATA:  Neck pain for 2 days.  Pain in chest. EXAM: CHEST  1 VIEW COMPARISON:  02/22/2021 FINDINGS: The heart size and mediastinal contours are within normal limits. Both lungs are clear. The visualized skeletal structures are unremarkable. IMPRESSION: No active disease. Electronically Signed   By: Signa Kell M.D.   On: 06/17/2021 10:51   DG Cervical Spine Complete  Result Date: 06/17/2021 CLINICAL DATA:  Neck pain since last night on the right side EXAM: CERVICAL SPINE - COMPLETE 4+ VIEW COMPARISON:  Cervical spine radiographs 06/15/2012 FINDINGS: Vertebral body heights are preserved. There is no evidence of acute fracture. Alignment is normal. The neural foramina appear patent. The prevertebral soft tissues are unremarkable. IMPRESSION: Unremarkable cervical spine radiographs. Electronically Signed   By: Lesia Hausen M.D.   On: 06/17/2021 08:54   MR Cervical Spine W or Wo Contrast  Result Date: 06/17/2021 CLINICAL DATA:  Cervical radiculopathy, infection suspected. Additional history provided: Neck pain for 2 days, cervical radiculopathy, mid back pain, infection  suspected. EXAM: MRI CERVICAL SPINE WITHOUT AND WITH CONTRAST TECHNIQUE: Multiplanar and multiecho pulse sequences of the cervical spine, to include the craniocervical junction and cervicothoracic junction, were obtained without and with intravenous contrast. CONTRAST:  55mL GADAVIST GADOBUTROL 1 MMOL/ML IV SOLN COMPARISON:  Cervical spine radiographs 06/17/2021. CT of the cervical spine 10/22/2010. FINDINGS: Mild intermittent motion degradation. Alignment: Straightening of the expected cervical lordosis. No significant spondylolisthesis. Vertebrae: Vertebral body height is maintained. No significant marrow  edema or focal suspicious osseous lesion. Cord: No spinal cord signal abnormality. Contact upon the ventral spinal cord at C5-C6, as described below. Posterior Fossa, vertebral arteries, paraspinal tissues: No abnormality identified within included portions of the posterior fossa. Flow voids preserved within the imaged cervical vertebral arteries. There is abnormal enhancement ventral to the vertebral bodies greatest at the C4-C6 levels. Additionally, there is abnormal enhancement within the ventral aspect of the spinal canal, also most notably at the C4-C6 levels. These findings are highly suspicious for epidural and paraspinal phlegmon (best appreciated on series 19, image 6). There is resultant spinal canal narrowing at the C5-C6 level, as described below. Disc levels: Mild multilevel disc degeneration, greatest at C5-C6 and C6-C7. C2-C3: No significant disc herniation or stenosis. C3-C4: Minimal uncovertebral hypertrophy (greater on the right). No significant disc herniation or stenosis. C4-C5: Minimal uncovertebral hypertrophy (greater on the right). No significant disc herniation or spinal canal stenosis at the disc level. Abnormal enhancing soft tissue within the ventral aspect of the spinal canal at the C5 vertebral body level, highly suspicious for phlegmon, contributing to mild/moderate spinal canal  stenosis with contact upon the ventral spinal cord. C5-C6: Disc bulge with bilateral uncovertebral hypertrophy. Superimposed central disc extrusion with cranial migration to the lower C5 vertebral body level (series 19, image 5). Superimposed abnormal enhancing soft tissue within the ventral aspect of the spinal canal highly suspicious for phlegmon, contributing to multifactorial moderate spinal canal stenosis, contacting and minimally flattening the ventral aspect of the spinal cord. No significant degenerative foraminal stenosis. However, abnormal enhancement suspicious for phlegmon extends into the bilateral neural foramina (series 20, image 16). C6-C7: Disc bulge asymmetric to the right with endplate spurring and right-sided uncovertebral hypertrophy. Superimposed abnormal enhancing soft tissue within the ventral aspect of the spinal canal highly suspicious for epidural phlegmon. Mild relative spinal canal narrowing (without spinal cord mass effect). Moderate degenerative right neural foraminal narrowing. No significant left foraminal stenosis. C7-T1: Mild facet arthrosis. No significant disc herniation or stenosis. IMPRESSION: Abnormal enhancement ventral to the cervical vertebral bodies and within the ventral aspect of the spinal canal, greatest at the C4-C6 levels, and highly suspicious for soft tissue infection with epidural/paraspinal phlegmon. Superimposed cervical spondylosis, including a cranially migrated central disc extrusion at C5-C6. Multilevel spinal canal stenosis. Most notably at C5-C6, a disc herniation and suspected ventral epidural phlegmon contribute to moderate spinal canal stenosis, contacting and mildly flattening the ventral spinal cord. Abnormal enhancement also suspicious for phlegmon extends into the bilateral C5-C6 neural foramina. At C6-C7, there is moderate degenerative right foraminal stenosis. Electronically Signed   By: Jackey LogeKyle  Golden D.O.   On: 06/17/2021 13:17   MR THORACIC  SPINE W WO CONTRAST  Result Date: 06/17/2021 CLINICAL DATA:  Epidural abscess; IV drug abuse, rule out epidural abscess. Additional history provided: Neck pain for 2 days. Cervical radiculopathy, mid back pain, infection suspected. EXAM: MRI THORACIC WITHOUT AND WITH CONTRAST TECHNIQUE: Multiplanar and multiecho pulse sequences of the thoracic spine were obtained without and with intravenous contrast. CONTRAST:  6mL GADAVIST GADOBUTROL 1 MMOL/ML IV SOLN COMPARISON:  Chest CT 05/07/2018. FINDINGS: Alignment:  No significant spondylolisthesis. Vertebrae: Redemonstrated mild chronic T7 and T8 vertebral compression deformities. Trace edema along the T8 inferior endplate appears degenerative. Elsewhere, no significant marrow edema is identified within the thoracic spine. T12 inferior endplate Schmorl node. Cord:  No spinal cord signal abnormality is identified. Paraspinal and other soft tissues: No abnormality identified within included portions of the thorax or upper abdomen/retroperitoneum.  Paraspinal soft tissues unremarkable. No abnormal enhancement within the thoracic spinal canal. Disc levels: Intervertebral disc height and hydration are maintained. Small T2-T3 central disc protrusion (without significant spinal canal stenosis). No significant disc herniation, spinal canal stenosis or neural foraminal narrowing at the remaining levels. IMPRESSION: No evidence of discitis/osteomyelitis or paraspinal soft tissue infection at the thoracic levels. Minimal edema along the T8 inferior endplate appears degenerative. Redemonstrated mild chronic T7 and T8 vertebral compression deformities. Small central disc protrusion at T2-T3 without significant spinal canal stenosis. Electronically Signed   By: Jackey Loge D.O.   On: 06/17/2021 13:24    Procedures Procedures  CRITICAL CARE Performed by: Arby Barrette   Total critical care time: 30 minutes  Critical care time was exclusive of separately billable procedures  and treating other patients.  Critical care was necessary to treat or prevent imminent or life-threatening deterioration.  Critical care was time spent personally by me on the following activities: development of treatment plan with patient and/or surrogate as well as nursing, discussions with consultants, evaluation of patient's response to treatment, examination of patient, obtaining history from patient or surrogate, ordering and performing treatments and interventions, ordering and review of laboratory studies, ordering and review of radiographic studies, pulse oximetry and re-evaluation of patient's condition.  Medications Ordered in ED Medications  lactated ringers infusion ( Intravenous New Bag/Given 06/17/21 1029)  ketorolac (TORADOL) 30 MG/ML injection 30 mg (30 mg Intravenous Given 06/17/21 1027)  HYDROmorphone (DILAUDID) injection 1 mg (1 mg Intravenous Given 06/17/21 1028)  LORazepam (ATIVAN) injection 1 mg (1 mg Intravenous Given 06/17/21 1054)  gadobutrol (GADAVIST) 1 MMOL/ML injection 6 mL (6 mLs Intravenous Contrast Given 06/17/21 1246)    ED Course  I have reviewed the triage vital signs and the nursing notes.  Pertinent labs & imaging results that were available during my care of the patient were reviewed by me and considered in my medical decision making (see chart for details).    MDM Rules/Calculators/A&P                           Patient presents as outlined.  He has history of ongoing IV drug abuse.  Patient reports severe pain in his cervical spine with radiation to the arms.  MRI is suggestive of epidural abscess\phlegmon.  Will start antibiotics and consult neurosurgery with anticipated admission to medical service. Final Clinical Impression(s) / ED Diagnoses Final diagnoses:  IV drug abuse (HCC)  Spinal epidural abscess    Rx / DC Orders ED Discharge Orders     None        Arby Barrette, MD 06/17/21 1411

## 2021-06-17 NOTE — Progress Notes (Signed)
Surgery ([facility] C5-6) now scheduled tomorrow afternoon. Pt may eat now, remain NPO after midnight.  Lisbeth Renshaw, MD [facility]

## 2021-06-17 NOTE — Consult Note (Signed)
Chief Complaint  Neck pain  History of Present Illness  Gerald Oliver is a 30 y.o. male presenting to the emergency department complaining of about 2 to 3 days of sudden onset severe neck pain.  Patient states the pain started without any identifiable inciting event.  He describes sharp, stabbing pain in the posterior midline between the shoulder blades.  Pain is exacerbated with movement of the neck and walking.  He does not complain of specific weakness in the upper or lower extremities.  No changes in bladder function.  No paresthesias in the upper extremities.  Of note, patient does have a history of IV drug use, last injecting approximately 24 hours ago.  He does not report any other history of heart disease, stroke, or blood clots.  Past Medical History   Past Medical History:  Diagnosis Date   Medical history non-contributory     Past Surgical History   Past Surgical History:  Procedure Laterality Date   WISDOM TOOTH EXTRACTION      Social History   Social History   Tobacco Use   Smoking status: Former    Types: Cigarettes    Quit date: 05/14/2008    Years since quitting: 13.1   Smokeless tobacco: Never  Substance Use Topics   Alcohol use: No    Comment: rarely   Drug use: Yes    Comment: heroin    Medications   Prior to Admission medications   Not on File    Allergies  No Known Allergies  Review of Systems  Review of Systems  Constitutional:  Positive for chills, diaphoresis and malaise/fatigue.  Musculoskeletal:  Positive for neck pain.   Neurologic Exam  Awake, alert, oriented Memory and concentration grossly intact Speech fluent, appropriate CN grossly intact Motor exam: Upper Extremities Deltoid Bicep Tricep Grip  Right 5/5 5/5 5/5 5/5  Left 5/5 5/5 5/5 5/5   Lower Extremities IP Quad PF DF EHL  Right 5/5 5/5 5/5 5/5 5/5  Left 5/5 5/5 5/5 5/5 5/5   Sensation grossly intact to LT  Imaging  MRI of the cervical spine was personally  reviewed.  This demonstrates enhancing epidural likely phlegmon extending from about the bottom of C4 down to about C6.  Maximal thickness is at the C5-6 disc space.  There does appear to be an underlying disc bulge with slight superior migration at the C5-6 level.  Overall there is moderate central stenosis and slight effacement of the ventral spinal cord.  Also noted is prevertebral enhancement likely indicative of prevertebral abscess/phlegmon  Impression  - 30 y.o. male IVDU with 2 to 3 days of neck pain, neurologically intact.  MRI demonstrates likely spinal epidural abscess with moderate stenosis and mild spinal cord compression worst at C5-6.  Plan  - Patient elects to proceed with surgical decompression.  We will proceed with anterior cervical discectomy and fusion C5-6 - Cont empiric abx - Blood cx pending  I reviewed the imaging findings with the patient.  We discussed treatment options including close expectant observation with IV antibiotic treatment versus upfront surgical decompression.  While observation may obviate the need for surgery, if there is development of neurologic compromise, there is no guarantee that surgical decompression at that point will improve his symptoms.  After our discussion, the patient does wish to proceed with surgery at this point.  We have reviewed the details of surgery as well as the expected postoperative course and recovery.  We discussed the risks of the procedure including  risk of nerve root and/or spinal cord injury leading to numbness, weakness, paralysis, vascular injury, bleeding, stroke, and CSF leak.  Patient appeared to understand our discussion.  He provided verbal consent to proceed.  All his questions were answered.   Lisbeth Renshaw, MD [facility]

## 2021-06-17 NOTE — ED Triage Notes (Signed)
Pt here from side of street with c/o neck pain times 2 days , no trauma noted

## 2021-06-18 ENCOUNTER — Inpatient Hospital Stay (HOSPITAL_COMMUNITY): Payer: Self-pay

## 2021-06-18 ENCOUNTER — Inpatient Hospital Stay (HOSPITAL_COMMUNITY): Payer: Self-pay | Admitting: Anesthesiology

## 2021-06-18 ENCOUNTER — Encounter (HOSPITAL_COMMUNITY)
Admission: EM | Disposition: A | Discharge status: Home / Self Care | Payer: Self-pay | Source: Home / Self Care | Attending: Internal Medicine

## 2021-06-18 DIAGNOSIS — F1110 Opioid abuse, uncomplicated: Secondary | ICD-10-CM

## 2021-06-18 DIAGNOSIS — G062 Extradural and subdural abscess, unspecified: Secondary | ICD-10-CM

## 2021-06-18 DIAGNOSIS — F1910 Other psychoactive substance abuse, uncomplicated: Secondary | ICD-10-CM

## 2021-06-18 HISTORY — PX: ANTERIOR CERVICAL DECOMP/DISCECTOMY FUSION: SHX1161

## 2021-06-18 LAB — BASIC METABOLIC PANEL
Anion gap: 9 (ref 5–15)
BUN: 14 mg/dL (ref 6–20)
CO2: 23 mmol/L (ref 22–32)
Calcium: 8.4 mg/dL — ABNORMAL LOW (ref 8.9–10.3)
Chloride: 102 mmol/L (ref 98–111)
Creatinine, Ser: 0.91 mg/dL (ref 0.61–1.24)
GFR, Estimated: >60 mL/min (ref >60)
Glucose, Bld: 204 mg/dL — ABNORMAL HIGH (ref 70–99)
Potassium: 3.7 mmol/L (ref 3.5–5.1)
Sodium: 134 mmol/L — ABNORMAL LOW (ref 135–145)

## 2021-06-18 LAB — CBC
HCT: 35.3 % — ABNORMAL LOW (ref 39.0–52.0)
Hemoglobin: 11.7 g/dL — ABNORMAL LOW (ref 13.0–17.0)
MCH: 26.7 pg (ref 26.0–34.0)
MCHC: 33.1 g/dL (ref 30.0–36.0)
MCV: 80.4 fL (ref 80.0–100.0)
Platelets: 116 10*3/uL — ABNORMAL LOW (ref 150–400)
RBC: 4.39 MIL/uL (ref 4.22–5.81)
RDW: 14 % (ref 11.5–15.5)
WBC: 12 10*3/uL — ABNORMAL HIGH (ref 4.0–10.5)
nRBC: 0 % (ref 0.0–0.2)

## 2021-06-18 LAB — C-REACTIVE PROTEIN: CRP: 7.3 mg/dL — ABNORMAL HIGH (ref <1.0)

## 2021-06-18 LAB — SEDIMENTATION RATE: Sed Rate: 35 mm/hr — ABNORMAL HIGH (ref 0–16)

## 2021-06-18 SURGERY — ANTERIOR CERVICAL DECOMPRESSION/DISCECTOMY FUSION 1 LEVEL
Anesthesia: General

## 2021-06-18 MED ORDER — SODIUM CHLORIDE 0.9 % IV SOLN
2.0000 g | Freq: Two times a day (BID) | INTRAVENOUS | Status: DC
  Administered 2021-06-18 – 2021-06-20 (×6): 2 g via INTRAVENOUS
  Filled 2021-06-18 (×7): qty 20

## 2021-06-18 MED ORDER — PHENYLEPHRINE 40 MCG/ML (10ML) SYRINGE FOR IV PUSH (FOR BLOOD PRESSURE SUPPORT)
PREFILLED_SYRINGE | INTRAVENOUS | Status: DC | PRN
  Administered 2021-06-18: 40 ug via INTRAVENOUS
  Administered 2021-06-18: 80 ug via INTRAVENOUS

## 2021-06-18 MED ORDER — HYDROMORPHONE HCL 1 MG/ML IJ SOLN
INTRAMUSCULAR | Status: AC
  Filled 2021-06-18: qty 1

## 2021-06-18 MED ORDER — HYDROMORPHONE HCL 1 MG/ML IJ SOLN
INTRAMUSCULAR | Status: DC | PRN
  Administered 2021-06-18 (×2): .5 mg via INTRAVENOUS

## 2021-06-18 MED ORDER — THROMBIN 5000 UNITS EX SOLR
CUTANEOUS | Status: AC
  Filled 2021-06-18: qty 10000

## 2021-06-18 MED ORDER — PROMETHAZINE HCL 25 MG/ML IJ SOLN: 6.2500 mg | INTRAMUSCULAR | Status: DC | PRN

## 2021-06-18 MED ORDER — VANCOMYCIN HCL 750 MG/150ML IV SOLN
750.0000 mg | Freq: Three times a day (TID) | INTRAVENOUS | Status: DC
  Administered 2021-06-18 – 2021-06-19 (×3): 750 mg via INTRAVENOUS
  Filled 2021-06-18 (×4): qty 150

## 2021-06-18 MED ORDER — ROCURONIUM BROMIDE 10 MG/ML (PF) SYRINGE
PREFILLED_SYRINGE | INTRAVENOUS | Status: DC | PRN
  Administered 2021-06-18: 100 mg via INTRAVENOUS

## 2021-06-18 MED ORDER — LIDOCAINE-EPINEPHRINE 1 %-1:100000 IJ SOLN
INTRAMUSCULAR | Status: DC | PRN
  Administered 2021-06-18: 3.5 mL

## 2021-06-18 MED ORDER — SUCCINYLCHOLINE CHLORIDE 200 MG/10ML IV SOSY
PREFILLED_SYRINGE | INTRAVENOUS | Status: DC | PRN
  Administered 2021-06-18: 120 mg via INTRAVENOUS

## 2021-06-18 MED ORDER — FENTANYL CITRATE (PF) 250 MCG/5ML IJ SOLN
INTRAMUSCULAR | Status: AC
  Filled 2021-06-18: qty 5

## 2021-06-18 MED ORDER — FENTANYL CITRATE (PF) 100 MCG/2ML IJ SOLN
25.0000 ug | INTRAMUSCULAR | Status: DC | PRN
  Administered 2021-06-18: 50 ug via INTRAVENOUS
  Administered 2021-06-18: 25 ug via INTRAVENOUS

## 2021-06-18 MED ORDER — FENTANYL CITRATE (PF) 100 MCG/2ML IJ SOLN
INTRAMUSCULAR | Status: AC
  Filled 2021-06-18: qty 2

## 2021-06-18 MED ORDER — PROPOFOL 10 MG/ML IV BOLUS
INTRAVENOUS | Status: AC
  Filled 2021-06-18: qty 20

## 2021-06-18 MED ORDER — MIDAZOLAM HCL 2 MG/2ML IJ SOLN
INTRAMUSCULAR | Status: DC | PRN
  Administered 2021-06-18: 4 mg via INTRAVENOUS

## 2021-06-18 MED ORDER — KETAMINE HCL 50 MG/5ML IJ SOSY
PREFILLED_SYRINGE | INTRAMUSCULAR | Status: AC
  Filled 2021-06-18: qty 5

## 2021-06-18 MED ORDER — KETAMINE HCL 10 MG/ML IJ SOLN
INTRAMUSCULAR | Status: DC | PRN
  Administered 2021-06-18: 30 mg via INTRAVENOUS
  Administered 2021-06-18: 10 mg via INTRAVENOUS

## 2021-06-18 MED ORDER — FENTANYL CITRATE (PF) 250 MCG/5ML IJ SOLN
INTRAMUSCULAR | Status: DC | PRN
  Administered 2021-06-18: 100 ug via INTRAVENOUS
  Administered 2021-06-18: 150 ug via INTRAVENOUS

## 2021-06-18 MED ORDER — BUPIVACAINE HCL 0.5 % IJ SOLN
INTRAMUSCULAR | Status: DC | PRN
  Administered 2021-06-18: 3.5 mL

## 2021-06-18 MED ORDER — DEXAMETHASONE SODIUM PHOSPHATE 10 MG/ML IJ SOLN
INTRAMUSCULAR | Status: DC | PRN
  Administered 2021-06-18: 10 mg via INTRAVENOUS

## 2021-06-18 MED ORDER — ENSURE ENLIVE PO LIQD
237.0000 mL | Freq: Two times a day (BID) | ORAL | Status: DC
  Administered 2021-06-19 – 2021-07-06 (×30): 237 mL via ORAL

## 2021-06-18 MED ORDER — ONDANSETRON HCL 4 MG/2ML IJ SOLN
INTRAMUSCULAR | Status: DC | PRN
  Administered 2021-06-18: 4 mg via INTRAVENOUS

## 2021-06-18 MED ORDER — HYDROMORPHONE HCL 1 MG/ML IJ SOLN
1.0000 mg | INTRAMUSCULAR | Status: DC | PRN
Start: 2021-06-18 — End: 2021-07-02
  Administered 2021-06-18 – 2021-07-02 (×69): 1 mg via INTRAVENOUS
  Filled 2021-06-18 (×71): qty 1

## 2021-06-18 MED ORDER — LIDOCAINE 2% (20 MG/ML) 5 ML SYRINGE
INTRAMUSCULAR | Status: DC | PRN
  Administered 2021-06-18: 100 mg via INTRAVENOUS

## 2021-06-18 MED ORDER — THROMBIN 5000 UNITS EX SOLR
OROMUCOSAL | Status: DC | PRN
  Administered 2021-06-18: 5 mL via TOPICAL

## 2021-06-18 MED ORDER — BUPIVACAINE HCL (PF) 0.5 % IJ SOLN
INTRAMUSCULAR | Status: AC
  Filled 2021-06-18: qty 30

## 2021-06-18 MED ORDER — LIDOCAINE-EPINEPHRINE (PF) 1 %-1:200000 IJ SOLN
INTRAMUSCULAR | Status: AC
  Filled 2021-06-18: qty 30

## 2021-06-18 MED ORDER — PROMETHAZINE HCL 25 MG/ML IJ SOLN
INTRAMUSCULAR | Status: AC
  Filled 2021-06-18: qty 1

## 2021-06-18 MED ORDER — LACTATED RINGERS IV SOLN: INTRAVENOUS | Status: DC | PRN

## 2021-06-18 MED ORDER — PROPOFOL 10 MG/ML IV BOLUS
INTRAVENOUS | Status: DC | PRN
  Administered 2021-06-18: 100 mg via INTRAVENOUS

## 2021-06-18 MED ORDER — SUGAMMADEX SODIUM 200 MG/2ML IV SOLN
INTRAVENOUS | Status: DC | PRN
  Administered 2021-06-18: 300 mg via INTRAVENOUS

## 2021-06-18 MED ORDER — 0.9 % SODIUM CHLORIDE (POUR BTL) OPTIME
TOPICAL | Status: DC | PRN
  Administered 2021-06-18: 1000 mL

## 2021-06-18 MED ORDER — THROMBIN 5000 UNITS EX SOLR
CUTANEOUS | Status: DC | PRN
  Administered 2021-06-18 (×2): 5000 [IU] via TOPICAL

## 2021-06-18 MED ORDER — PHENYLEPHRINE HCL-NACL 20-0.9 MG/250ML-% IV SOLN
INTRAVENOUS | Status: DC | PRN
  Administered 2021-06-18: 25 ug/min via INTRAVENOUS

## 2021-06-18 MED ORDER — THROMBIN 5000 UNITS EX SOLR
CUTANEOUS | Status: AC
  Filled 2021-06-18: qty 5000

## 2021-06-18 MED ORDER — ADULT MULTIVITAMIN W/MINERALS CH
1.0000 | ORAL_TABLET | Freq: Every day | ORAL | Status: DC
  Administered 2021-06-19 – 2021-06-21 (×3): 1 via ORAL
  Filled 2021-06-18 (×3): qty 1

## 2021-06-18 MED ORDER — MIDAZOLAM HCL 2 MG/2ML IJ SOLN
INTRAMUSCULAR | Status: AC
  Filled 2021-06-18: qty 4

## 2021-06-18 MED ORDER — THROMBIN (RECOMBINANT) 5000 UNITS EX SOLR
CUTANEOUS | Status: AC
  Filled 2021-06-18: qty 10000

## 2021-06-18 SURGICAL SUPPLY — 66 items
ADH SKN CLS APL DERMABOND .7 (GAUZE/BANDAGES/DRESSINGS) ×1
ADH SKN CLS LQ APL DERMABOND (GAUZE/BANDAGES/DRESSINGS) ×1
APL SKNCLS STERI-STRIP NONHPOA (GAUZE/BANDAGES/DRESSINGS)
BAG COUNTER SPONGE SURGICOUNT (BAG) ×2 IMPLANT
BAG SPNG CNTER NS LX DISP (BAG) ×1
BAND INSRT 18 STRL LF DISP RB (MISCELLANEOUS) ×2
BAND RUBBER #18 3X1/16 STRL (MISCELLANEOUS) ×4 IMPLANT
BENZOIN TINCTURE PRP APPL 2/3 (GAUZE/BANDAGES/DRESSINGS) IMPLANT
BLADE CLIPPER SURG (BLADE) ×1 IMPLANT
BLADE SURG 11 STRL SS (BLADE) ×1 IMPLANT
BLADE ULTRA TIP 2M (BLADE) ×1 IMPLANT
BUR MATCHSTICK NEURO 3.0 LAGG (BURR) ×2 IMPLANT
CAGE LORDOTIC 6 SM (Cage) ×1 IMPLANT
CANISTER SUCT 3000ML PPV (MISCELLANEOUS) ×2 IMPLANT
CARTRIDGE OIL MAESTRO DRILL (MISCELLANEOUS) ×1 IMPLANT
DECANTER SPIKE VIAL GLASS SM (MISCELLANEOUS) ×1 IMPLANT
DERMABOND ADHESIVE PROPEN (GAUZE/BANDAGES/DRESSINGS) ×1
DERMABOND ADVANCED (GAUZE/BANDAGES/DRESSINGS) ×1
DERMABOND ADVANCED .7 DNX12 (GAUZE/BANDAGES/DRESSINGS) ×1 IMPLANT
DERMABOND ADVANCED .7 DNX6 (GAUZE/BANDAGES/DRESSINGS) IMPLANT
DIFFUSER DRILL AIR PNEUMATIC (MISCELLANEOUS) ×2 IMPLANT
DRAPE C-ARM 42X72 X-RAY (DRAPES) ×4 IMPLANT
DRAPE HALF SHEET 40X57 (DRAPES) ×1 IMPLANT
DRAPE LAPAROTOMY 100X72 PEDS (DRAPES) ×2 IMPLANT
DRAPE MICROSCOPE LEICA (MISCELLANEOUS) ×2 IMPLANT
DRAPE PACK CV HEAVY DUTY (DRAPES) ×1 IMPLANT
DRSG OPSITE 4X5.5 SM (GAUZE/BANDAGES/DRESSINGS) ×4 IMPLANT
DRSG OPSITE POSTOP 4X6 (GAUZE/BANDAGES/DRESSINGS) ×1 IMPLANT
DURAPREP 6ML APPLICATOR 50/CS (WOUND CARE) ×2 IMPLANT
ELECT COATED BLADE 2.86 ST (ELECTRODE) ×2 IMPLANT
ELECT REM PT RETURN 9FT ADLT (ELECTROSURGICAL) ×2
ELECTRODE REM PT RTRN 9FT ADLT (ELECTROSURGICAL) ×1 IMPLANT
GAUZE 4X4 16PLY ~~LOC~~+RFID DBL (SPONGE) IMPLANT
GLOVE EXAM NITRILE XL STR (GLOVE) ×1 IMPLANT
GLOVE SURG ENC MOIS LTX SZ7.5 (GLOVE) ×2 IMPLANT
GLOVE SURG LTX SZ7 (GLOVE) ×2 IMPLANT
GLOVE SURG UNDER POLY LF SZ7.5 (GLOVE) ×4 IMPLANT
GOWN STRL REUS W/ TWL LRG LVL3 (GOWN DISPOSABLE) ×2 IMPLANT
GOWN STRL REUS W/ TWL XL LVL3 (GOWN DISPOSABLE) IMPLANT
GOWN STRL REUS W/TWL 2XL LVL3 (GOWN DISPOSABLE) IMPLANT
GOWN STRL REUS W/TWL LRG LVL3 (GOWN DISPOSABLE) ×4
GOWN STRL REUS W/TWL XL LVL3 (GOWN DISPOSABLE)
HEMOSTAT POWDER KIT SURGIFOAM (HEMOSTASIS) ×2 IMPLANT
KIT BASIN OR (CUSTOM PROCEDURE TRAY) ×2 IMPLANT
KIT TURNOVER KIT B (KITS) ×2 IMPLANT
MARKER SKIN DUAL TIP RULER LAB (MISCELLANEOUS) ×1 IMPLANT
NDL SPNL 22GX3.5 QUINCKE BK (NEEDLE) ×1 IMPLANT
NEEDLE HYPO 22GX1.5 SAFETY (NEEDLE) ×2 IMPLANT
NEEDLE SPNL 22GX3.5 QUINCKE BK (NEEDLE) ×2 IMPLANT
NS IRRIG 1000ML POUR BTL (IV SOLUTION) ×2 IMPLANT
OIL CARTRIDGE MAESTRO DRILL (MISCELLANEOUS) ×2
PACK LAMINECTOMY NEURO (CUSTOM PROCEDURE TRAY) ×2 IMPLANT
PAD ARMBOARD 7.5X6 YLW CONV (MISCELLANEOUS) ×6 IMPLANT
PLATE ZEVO 1LVL 17MM (Plate) ×1 IMPLANT
PUTTY DBF 1CC CORTICAL FIBERS (Putty) ×1 IMPLANT
SCREW 13MM (Screw) ×4 IMPLANT
SPONGE INTESTINAL PEANUT (DISPOSABLE) ×2 IMPLANT
SPONGE SURGIFOAM ABS GEL SZ50 (HEMOSTASIS) ×2 IMPLANT
STAPLER VISISTAT 35W (STAPLE) ×2 IMPLANT
STRIP CLOSURE SKIN 1/2X4 (GAUZE/BANDAGES/DRESSINGS) IMPLANT
SUT VIC AB 3-0 SH 8-18 (SUTURE) ×2 IMPLANT
SUT VICRYL 3-0 RB1 18 ABS (SUTURE) ×4 IMPLANT
TAPE CLOTH 3X10 TAN LF (GAUZE/BANDAGES/DRESSINGS) ×2 IMPLANT
TOWEL GREEN STERILE (TOWEL DISPOSABLE) ×2 IMPLANT
TOWEL GREEN STERILE FF (TOWEL DISPOSABLE) ×2 IMPLANT
WATER STERILE IRR 1000ML POUR (IV SOLUTION) ×2 IMPLANT

## 2021-06-18 NOTE — Progress Notes (Signed)
  NEUROSURGERY PROGRESS NOTE   No issues overnight. Cont to c/o severe neck pain worsened with movement.  EXAM:  BP 130/85   Pulse (!) 55   Temp 98.8 F (37.1 C) (Oral)   Resp 12   SpO2 97%   Awake, alert, oriented  Speech fluent, appropriate  CN grossly intact  Good strength BUE/BLE  IMPRESSION:  30 y.o. male with active IVDU and SEA based at C5-6.  PLAN: - Will proceed today with ACDF C5-6  I have again discussed the situation with the patient. All his questions were answered and he is ready to proceed with surgery.   Lisbeth Renshaw, MD [facility]

## 2021-06-18 NOTE — Transfer of Care (Signed)
Immediate Anesthesia Transfer of Care Note  Patient: Gerald Oliver  Procedure(s) Performed: ANTERIOR CERVICAL DECOMPRESSION/DISCECTOMY FUSION C5-C6  Patient Location: PACU  Anesthesia Type:General  Level of Consciousness: awake, alert  and confused  Airway & Oxygen Therapy: Patient Spontanous Breathing  Post-op Assessment: Report given to RN and Post -op Vital signs reviewed and stable  Post vital signs: Reviewed and stable  Last Vitals:  Vitals Value Taken Time  BP 157/90 06/18/21 1836  Temp 37.5 C 06/18/21 1835  Pulse 74 06/18/21 1842  Resp 13 06/18/21 1842  SpO2 96 % 06/18/21 1842  Vitals shown include unvalidated device data.  Last Pain:  Vitals:   06/18/21 1600  TempSrc: Oral  PainSc: 8          Complications: No notable events documented.

## 2021-06-18 NOTE — Progress Notes (Signed)
Initial Nutrition Assessment  DOCUMENTATION CODES:  Not applicable  INTERVENTION:  Advance diet to regular once medically able.  Obtain updated weight.  Add Ensure po BID, each supplement provides 350 kcal and 13-20 grams of protein.  Add Magic cup TID with meals, each supplement provides 290 kcal and 9 grams of protein.  Add MVI with minerals daily.  NUTRITION DIAGNOSIS:  Increased nutrient needs related to acute illness (epidural abscess) as evidenced by estimated needs.  GOAL:  Patient will meet greater than or equal to 90% of their needs  MONITOR:  PO intake, Supplement acceptance, Labs, Weight trends, I & O's  REASON FOR ASSESSMENT:  Consult Assessment of nutrition requirement/status  ASSESSMENT:  30 yo male with a PMH of IVDA and prior heroin overdose in 02/2021 presenting with upper back pain.  He reports onset of pain last night.  +subjective fevers.  He occasionally smokes tobacco and marijuana and occasionally drinks alcohol.  He uses heroin routinely, last use 2 days ago.  He moved to [place] at age [age] and dropped out of school shortly in 18-Feb-2023 grade.  His father died when he was 89.  His mother died of esophageal cancer in 02/17/17.  He and his brother are both homeless and heroin abusers.  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 Admitted with epidural abscess.  Pt unavailable at time of RD visit.  Pt at risk for malnutrition due to homelessness and drug addiction/abuse.  Pt in need of new weight, as patient was not weighed on admission.  Recommend Magic Cup TID, Ensure Enlive BID, and MVI with minerals daily.  Medications: reviewed; colace BID, LR @ 75 ml/hr, Dilaudid PRN (given once today), morphine PRN (given twice today)  Labs: reviewed; Na 134 (L), Glucose 204 (H)  NUTRITION - FOCUSED PHYSICAL EXAM: Unable to perform  Diet Order:   Diet Order             Diet NPO time specified  Diet effective midnight                  EDUCATION  NEEDS:  No education needs have been identified at this time  Skin:  Skin Assessment: Reviewed RN Assessment  Last BM:  unknown  Height:  Ht Readings from Last 1 Encounters:  03/13/21 5\' 9"  (1.753 m)   Weight:  Wt Readings from Last 1 Encounters:  03/13/21 65 kg   BMI:  There is no height or weight on file to calculate BMI.  Estimated Nutritional Needs:  Kcal:  2100-2300 Protein:  80-95 grams Fluid:  >2.1 L  03/15/21, RD, LDN (she/her/hers) Registered Dietitian I After-Hours/Weekend Pager # in [place]

## 2021-06-18 NOTE — Consult Note (Addendum)
I have seen and examined the patient. I have personally reviewed the clinical findings, laboratory findings, microbiological data and imaging studies. The assessment and treatment plan was discussed with the Resident Dellis Filbert. I agree with her/his recommendations except following additions/corrections.  30 Year old Male with h/o active IVDU ( last use 3 days ago), hepatitis C( untreated ) with h/o drug overdose, homelessness who presented to the ED with complains of neck pain for 2 days. He was seen in the ED 3 days ago after [place] and admitted in may 2022 for drug overdose.   At ED, he was afebrile with WBC of 12  Blood cx 9/1 no growth so far SARScov2 - asymptomatic MRI CT spine concerning for epidural/paraspinal phlegmon c5-c6/moderate spinal stenosis with mild flattening of the ventral spinal cord  Started on bs abtx with Vancomycin and meropenem Plan to OR by Neurosurgery tomorrow noted  Vitals BP 111/73 (BP Location: Right Arm)   Pulse (!) 57   Temp 99 F (37.2 C) (Oral)   Resp 11   SpO2 99%  Exam - patient in severe pain and in fetal position, limited exam             HEENT WNL              Chest - bilateral equal air entry              CVS- s1s2, RRR, Murmur+             Abdomen - soft, non tender             Neuro - awake, alert and oriented, c spine and tspine tenderness, power intact in all extremities              Skin - excoriations in the feet  Plan  Continue Vancomycin and will de-escalate meropenem to ceftriaxone  2 sets of blood cultures ( ordered ) TTE  Fu Neurosurgical plans Monitor CBC , CMP and Vancomycin trough  HCV RNA, Urine GC  COVID management per primary COVID isolation precautions per IP Following peripherally over the long weekend, please call with questions/concerns.  Odette Fraction, MD [facility]     [facility]  for Infectious Disease    Date of Admission:  06/17/2021   Total days of antibiotics 2 days        Day 2 Meropenum         Day 2 Vancomycin               Reason for Consult: Epidural Abscess Requesting Provider:  Pamella Pert    Consult Provider: Odette Fraction, MD   Assessment: Gerald Oliver is a 30 year old male with a PMHx of IVDA with prior history of OD May of 2022 who presented to the ED c/o 2 day history of neck pain localized to the cervical region with sharp pain that radiates to bilateral arms and subjective fevers. Patient reports last IV heroin use 3 days ago.  Initial labs reveal WBC 12.8; lactate 0.9; Blood cultures x2 show no growth; Patient is COVID positive on admission.  X-ray of the cervical spine is unremarkable- no acute fractures, alignment is normal;  neural foramina patent. CXR reveals no active disease.  MRI of the cervical spine reveals abnormal enhancement ventral to the cervical vertebral bodies and within the ventral aspect of the spinal canal,  greatest at the C4-C6 levels and highly suspicious for soft tissue infection with epidural/paraspinal phlegmon. Superimposed cervical spondylosis, including a cranially migrated central disc extrusion at C5-C6. Multilevel spinal canal stenosis. Most notably at C5-C6, a disc herniation and suspected ventral epidural phlegmon contribute to moderate spinal canal stenosis, contacting and mildly flattening the ventral spinal cord. Abnormal enhancement also suspicious for phlegmon extends into the bilateral C5-C6 neural foramina.  PE: Patient was unable to cooperate due to neck pain. Patient in fetal position stating "too painful to move around".    Plan: Cervical Epidural Abscess   ---IV meropenem 2g IV q8 hrs>> ceftriaxone 2g iv q12 hrs    ---IV Vancomycin Q12H  ---Scheduled surgical decompression surgery today per neurosurgery  ---Blood cultures 2 sets. TTE and HCV RNA   Principal Problem:   Epidural  abscess Active Problems:   Heroin abuse (HCC)   Homeless   Scheduled Meds:  docusate sodium  100 mg Oral BID   sodium chloride flush  3 mL Intravenous Q12H   Continuous Infusions:  lactated ringers Stopped (06/18/21 0639)   meropenem (MERREM) IV Stopped (06/18/21 2633)   vancomycin Stopped (06/18/21 0534)   PRN Meds:.acetaminophen **OR** acetaminophen, bisacodyl, dicyclomine, hydrALAZINE, HYDROcodone-acetaminophen, HYDROmorphone (DILAUDID) injection, hydrOXYzine, morphine injection, ondansetron **OR** ondansetron (ZOFRAN) IV, polyethylene glycol  HPI: Gerald Oliver is a 30 y.o. male with a history of IVDA with prior OD May of 2022 presented to the ED on 06/17/21 c/o cervical neck pain with sharp pain radiating to bilateral arms and subjective fevers for the past 2 days. Patient denies injury or recent trauma associated with his neck pain. He denies headache, CP, SOB, incontinence or LE numbness/weakness associated with his current symptoms.  During interview, patient was in fetal position complaining of neck pain. He states slight improvement in subjective fevers since beginning antibiotics. He states that moving his neck and arms elicit pain. However, he admits he was able ambulate unassisted to void overnight. Denies any weakness/numbness of the lower extremities and denies incontinence.   Review of Systems: Review of Systems  Constitutional:  Negative for chills and fever.  Eyes:  Negative for blurred vision.  Respiratory:  Negative for shortness of breath.   Cardiovascular:  Negative for chest pain and palpitations.  Gastrointestinal:  Negative for constipation, diarrhea, nausea and vomiting.  Genitourinary:  Negative for dysuria.  Musculoskeletal:  Positive for neck pain.  Neurological:  Positive for headaches. Negative for dizziness and focal weakness.   Past Medical History:  Diagnosis Date   Accidental overdose of heroin (HCC) 02/2021   Heroin dependence (HCC)    Past  Surgical History:  Procedure Laterality Date   WISDOM TOOTH EXTRACTION      Social History   Tobacco Use   Smoking status: Some Days    Types: Cigarettes   Smokeless tobacco: Never  Substance Use Topics   Alcohol use: Yes    Comment: rarely   Drug use: Yes    Types: Heroin, Marijuana    Comment: heroin    Family History  Problem Relation Age of Onset   Cancer Mother    No Known Allergies  OBJECTIVE: Blood pressure 111/73, pulse (!) 57, temperature 99 F (37.2 C), temperature source Oral, resp. rate 11, SpO2 99 %.  Physical Exam  Lab Results Lab Results  Component Value Date   WBC 12.0 (H) 06/18/2021   HGB 11.7 (L) 06/18/2021   HCT 35.3 (L) 06/18/2021   MCV 80.4 06/18/2021   PLT 116 (L) 06/18/2021  Lab Results  Component Value Date   CREATININE 0.91 06/18/2021   BUN 14 06/18/2021   NA 134 (L) 06/18/2021   K 3.7 06/18/2021   CL 102 06/18/2021   CO2 23 06/18/2021    Lab Results  Component Value Date   ALT 9 06/17/2021   AST 13 (L) 06/17/2021   ALKPHOS 83 06/17/2021   BILITOT 0.8 06/17/2021     Microbiology: Recent Results (from the past 240 hour(s))  Culture, blood (routine x 2)     Status: None (Preliminary result)   Collection Time: 06/17/21  2:03 PM   Specimen: BLOOD LEFT HAND  Result Value Ref Range Status   Specimen Description BLOOD LEFT HAND  Final   Special Requests   Final    BOTTLES DRAWN AEROBIC AND ANAEROBIC Blood Culture adequate volume   Culture   Final    NO GROWTH < 24 HOURS Performed at [facility], [address] [place], [place] [address]    Report Status PENDING  Incomplete  Resp Panel by RT-PCR (Flu A&B, Covid) Nasopharyngeal Swab     Status: Abnormal   Collection Time: 06/17/21  6:24 PM   Specimen: Nasopharyngeal Swab; Nasopharyngeal(NP) swabs in vial transport medium  Result Value Ref Range Status   SARS Coronavirus 2 by RT PCR POSITIVE (A) NEGATIVE Final    Comment: RESULT CALLED TO, READ BACK BY AND VERIFIED  WITH: GRACE TATE RN 06/17/21 @2032  BY JW (NOTE) SARS-CoV-2 target nucleic acids are DETECTED.  The SARS-CoV-2 RNA is generally detectable in upper respiratory specimens during the acute phase of infection. Positive results are indicative of the presence of the identified virus, but do not rule out bacterial infection or co-infection with other pathogens not detected by the test. Clinical correlation with patient history and other diagnostic information is necessary to determine patient infection status. The expected result is Negative.  Fact Sheet for Patients: BloggerCourse.comhttps://www.fda.gov/media/152166/download  Fact Sheet for Healthcare Providers: SeriousBroker.ithttps://www.fda.gov/media/152162/download  This test is not yet approved or cleared by the [place] FDA and  has been authorized for detection and/or diagnosis of SARS-CoV-2 by FDA under an Emergency Use Authorization (EUA).  This EUA will remain in effect (meaning this test can be  used) for the duration of  the COVID-19 declaration under Section 564(b)(1) of the Act, 21 U.S.C. section 360bbb-3(b)(1), unless the authorization is terminated or revoked sooner.     Influenza A by PCR NEGATIVE NEGATIVE Final   Influenza B by PCR NEGATIVE NEGATIVE Final    Comment: (NOTE) The Xpert Xpress SARS-CoV-2/FLU/RSV plus assay is intended as an aid in the diagnosis of influenza from Nasopharyngeal swab specimens and should not be used as a sole basis for treatment. Nasal washings and aspirates are unacceptable for Xpert Xpress SARS-CoV-2/FLU/RSV testing.  Fact Sheet for Patients: BloggerCourse.comhttps://www.fda.gov/media/152166/download  Fact Sheet for Healthcare Providers: SeriousBroker.ithttps://www.fda.gov/media/152162/download  This test is not yet approved or cleared by the [place] FDA and has been authorized for detection and/or diagnosis of SARS-CoV-2 by FDA under an Emergency Use Authorization (EUA). This EUA will remain in effect (meaning this test can be used)  for the duration of the COVID-19 declaration under Section 564(b)(1) of the Act, 21 U.S.C. section 360bbb-3(b)(1), unless the authorization is terminated or revoked.  Performed at [facility], [address] [place], [place] [address]   Culture, blood (Routine X 2) w Reflex to ID Panel     Status: None (Preliminary result)   Collection Time: 06/18/21 12:06 AM  Specimen: BLOOD  Result Value Ref Range Status   Specimen Description BLOOD SITE NOT SPECIFIED  Final   Special Requests   Final    BOTTLES DRAWN AEROBIC AND ANAEROBIC Blood Culture adequate volume   Culture   Final    NO GROWTH < 12 HOURS Performed at [facility], [address] [place], [place] [address]    Report Status PENDING  Incomplete   Pertinent Imagings 06/17/21 MRI C spine 06/17/21 IMPRESSION: Abnormal enhancement ventral to the cervical vertebral bodies and within the ventral aspect of the spinal canal, greatest at the C4-C6 levels, and highly suspicious for soft tissue infection with epidural/paraspinal phlegmon. Superimposed cervical spondylosis, including a cranially migrated central disc extrusion at C5-C6. Multilevel spinal canal stenosis. Most notably at C5-C6, a disc herniation and suspected ventral epidural phlegmon contribute to moderate spinal canal stenosis, contacting and mildly flattening the ventral spinal cord. Abnormal enhancement also suspicious for phlegmon extends into the bilateral C5-C6 neural foramina.   At C6-C7, there is moderate degenerative right foraminal stenosis.   Chest Xray 06/17/21 FINDINGS: The heart size and mediastinal contours are within normal limits. Both lungs are clear. The visualized skeletal structures are unremarkable.   IMPRESSION: No active disease.  Cervical Xray  FINDINGS: Vertebral body heights are preserved. There is no evidence of acute fracture. Alignment is normal. The neural foramina appear patent.   The prevertebral soft tissues are  unremarkable.   IMPRESSION: Unremarkable cervical spine radiographs.    Dellis Filbert, MD PGY-1 Internal Medicine Resident [facility] [phone] pager    06/18/2021, 9:29 AM

## 2021-06-18 NOTE — Anesthesia Procedure Notes (Signed)
Procedure Name: Intubation Date/Time: 06/18/2021 4:12 PM Performed by: Lynnell Chad, CRNA Pre-anesthesia Checklist: Patient identified, Emergency Drugs available, Suction available and Patient being monitored Patient Re-evaluated:Patient Re-evaluated prior to induction Oxygen Delivery Method: Circle System Utilized Preoxygenation: Pre-oxygenation with 100% oxygen Induction Type: IV induction Ventilation: Mask ventilation without difficulty Laryngoscope Size: Desa and 2 Grade View: Grade I Tube type: Oral Tube size: 7.5 mm Number of attempts: 1 Airway Equipment and Method: Stylet and Oral airway Placement Confirmation: ETT inserted through vocal cords under direct vision, positive ETCO2 and breath sounds checked- equal and bilateral Secured at: 22 cm Tube secured with: Tape Dental Injury: Teeth and Oropharynx as per pre-operative assessment

## 2021-06-18 NOTE — Progress Notes (Signed)
Patient does not want echo exam at this time since he is going to surgery, even though I told him echo would take 15 minutes.

## 2021-06-18 NOTE — Anesthesia Preprocedure Evaluation (Addendum)
Anesthesia Evaluation  Patient identified by MRN, date of birth, ID band Patient awake    Reviewed: Allergy & Precautions, NPO status , Patient's Chart, lab work & pertinent test results  History of Anesthesia Complications Negative for: history of anesthetic complications  Airway Mallampati: II  TM Distance: >3 FB Neck ROM: Limited    Dental no notable dental hx.    Pulmonary neg pulmonary ROS, Current Smoker,    Pulmonary exam normal breath sounds clear to auscultation       Cardiovascular negative cardio ROS Normal cardiovascular exam Rhythm:Regular Rate:Normal     Neuro/Psych negative neurological ROS  negative psych ROS   GI/Hepatic negative GI ROS, (+)     substance abuse  marijuana use and IV drug use,   Endo/Other  negative endocrine ROS  Renal/GU Renal disease  negative genitourinary   Musculoskeletal negative musculoskeletal ROS (+) narcotic dependent  Abdominal   Peds negative pediatric ROS (+)  Hematology negative hematology ROS (+) anemia ,  Plt 116k    Anesthesia Other Findings Hx heroin OD 02/2021 Covid+, asymptomatic   Reproductive/Obstetrics negative OB ROS                            Anesthesia Physical Anesthesia Plan  ASA: 3  Anesthesia Plan: General   Post-op Pain Management:    Induction: Intravenous  PONV Risk Score and Plan: 1 and Treatment may vary due to age or medical condition, Ondansetron, Dexamethasone and Midazolam  Airway Management Planned: Oral ETT and Video Laryngoscope Planned  Additional Equipment: None  Intra-op Plan:   Post-operative Plan: Extubation in OR  Informed Consent: I have reviewed the patients History and Physical, chart, labs and discussed the procedure including the risks, benefits and alternatives for the proposed anesthesia with the patient or authorized representative who has indicated his/her understanding and  acceptance.     Dental advisory given  Plan Discussed with: CRNA and Surgeon  Anesthesia Plan Comments:        Anesthesia Quick Evaluation

## 2021-06-18 NOTE — Op Note (Signed)
NEUROSURGERY OPERATIVE NOTE   PREOP DIAGNOSIS: Cervical spinal epidural abscess  POSTOP DIAGNOSIS: Same  PROCEDURE: 1. Discectomy at C5-6 for decompression of thecal sac and debridement of epidural abscess 2. Placement of intervertebral biomechanical device Medtronic Titan 81mm lordotic cage 3. Placement of anterior instrumentation consisting of interbody plate and screws - Medtronic Zevo 45mm plate 10UV screws  4. Use of morselized bone allograft  5. Arthrodesis C5-6, anterior interbody technique  6. Use of intraoperative microscope  SURGEON: Dr. Lisbeth Renshaw, MD  ASSISTANT: None  ANESTHESIA: General Endotracheal  EBL: 50cc  SPECIMENS: None  DRAINS: None  COMPLICATIONS: None immediate  CONDITION: Hemodynamically stable to PACU  HISTORY: Gerald Oliver is a 30 y.o. y.o. male with a history of IVDU presenting to the emergency department with severe neck pain for several days.  Patient was noted to be neurologically intact.  He underwent work-up in the emergency department including MRI of the cervical spine demonstrating an epidural abscess centered at C5-6 with up to moderate spinal stenosis and some mild spinal cord compression.  Treatment options were discussed and he did elect to proceed with surgical decompression and fusion.  The risks, benefits, and alternatives to the surgery were all reviewed in detail with the patient.  After all his questions were answered informed consent was verbally obtained.  PROCEDURE IN DETAIL: The patient was brought to the operating room and transferred to the operative table. After induction of general anesthesia, the patient was positioned on the operative table in the supine position with all pressure points meticulously padded. The skin of the neck was then prepped and draped in the usual sterile fashion.  After timeout was conducted, the right sided transverse neck skin was infiltrated with local anesthetic. Skin incision was then  made sharply and Bovie electrocautery was used to dissect the subcutaneous tissue until the platysma was identified. The platysma was then divided and undermined. The sternocleidomastoid muscle was then identified and, utilizing natural fascial planes in the neck, the prevertebral fascia was identified and the carotid sheath was retracted laterally and the trachea and esophagus retracted medially.  The prevertebral space was noted to be somewhat thickened although no obvious phlegmon or abscess was noted.  The disc space was identified and spinal needle was introduced and lateral fluoroscopy taken to confirm our location at the correct C5-6 disc space.  Bovie electrocautery was then used to dissect in the subperiosteal plane and elevate the longus coli muscles bilaterally.  Table mounted retractors were then placed.  Microscope was then brought into the field and the remainder of the discectomy and fusion procedure was done under the microscope using microdissection technique.  The disc space was incised sharply and rongeurs were use to initially complete a discectomy. The high-speed drill was then used to complete discectomy until the posterior annulus was identified and removed and the posterior longitudinal ligament was identified.  There did appear to be significant amount of phlegmon between the posterior annulus and the posterior longitudinal ligament.  This material was removed and sent for culture.  Standard culture swabs were also taken of this material.  Immediately deep to this the posterior longitudinal ligament was identified.  This was elevated and removed piecemeal with Kerrison punches.  Using a combination of Kerrison punches, the inferior border of C5 and superior border of C6 were undercut in order to adequately decompress.  Having completed our decompression, attention was turned to placement of the intervertebral device. Trial spacers were used to select a 6 mm  graft. This graft was then  filled with morcellized allograft, and inserted so that it was flush with the anterior vertebral bodies.  After placement of the intervertebral device, the above anterior cervical plate was selected, and placed across the interspace. Using a high-speed drill, the cortex of the cervical vertebral bodies was drilled, and screws inserted in C5 and C6. Final fluoroscopic image lateral projection was taken to confirm good hardware placement.  At this point, after all counts were verified to be correct, meticulous hemostasis was secured using a combination of bipolar electrocautery and passive hemostatics. The platysma muscle was then closed using interrupted 3-0 Vicryl sutures, and the skin was closed with an interrupted 3-0 Vicry subcuticular stitch. Dermabond and sterile dressings were then applied and the drapes removed.  The patient tolerated the procedure well and was extubated in the room and taken to the postanesthesia care unit in stable condition.   Lisbeth Renshaw, MD [facility]

## 2021-06-18 NOTE — Progress Notes (Addendum)
PROGRESS NOTE  KOHAN AZIZI LFY:[id number] DOB: March 30, 1991 DOA: 06/17/2021 PCP: Patient, No Pcp Per (Inactive)   LOS: 1 day   Brief Narrative / Interim history: 30 year old male with history of IV drug use, prior heroin overdose in May 2022 comes into the hospital with upper back pain.  This is been going on for the last several days progressively getting worse.  He also reports subjective fevers.  He also smokes tobacco, marijuana and occasionally drinks EtOH.  He uses heroin regularly, last used 2 days prior to admission.  Of note, his brother is also hospitalized with IV drug use related complications.  On admission, he was noted to be incidentally COVID-positive.  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  The phlegmon appears to extend into the bilateral C5-C6 neural foramina -Patient has been placed on broad-spectrum antibiotics.  Neurosurgery and infectious disease consulted.  He is scheduled to get surgery this afternoon -Continue to monitor cultures, closely monitor neuro status  Active Problems Heroin, polysubstance abuse-longstanding heroin dependence.  He was placed on clonidine withdrawal protocol however he has excruciating pain and is about to undergo surgery.  Stop detox protocol and place patient on Dilaudid at least perioperatively. -2D echo pending, follow results  Covid positive-no significant respiratory symptoms related to COVID.  Chest x-ray negative.  Recommend 10-day isolation until 9/11, no treatment necessary at this point  [facility] consulted  Scheduled Meds:  docusate sodium  100 mg Oral BID   sodium chloride flush  3 mL Intravenous Q12H    Continuous Infusions:  cefTRIAXone (ROCEPHIN)  IV 2 g (06/18/21 1100)   lactated ringers Stopped (06/18/21 0943)   vancomycin     PRN Meds:.acetaminophen **OR** acetaminophen, bisacodyl, dicyclomine, hydrALAZINE, HYDROcodone-acetaminophen, HYDROmorphone (DILAUDID) injection, hydrOXYzine, morphine injection, ondansetron **OR** ondansetron (ZOFRAN) IV, polyethylene glycol  Diet Orders (From admission, onward)     Start     Ordered   06/18/21 0001  Diet NPO time specified  Diet effective midnight        06/17/21 1926            DVT prophylaxis: SCDs Start: 06/17/21 1716     Code Status: Full Code  Family Communication: No family at bedside  Status is: Inpatient  Remains inpatient appropriate because:Inpatient level of care appropriate due to severity of illness  Dispo: The patient is from: Home              Anticipated d/c is to: Home              Patient currently is not medically stable to d/c.   Difficult to place patient No   Level of care: Progressive  Consultants:  Neurosurgery Infectious disease  Procedures:  2D echo: Pending  Microbiology  Blood cultures 9/1-pending RT-PCR for Covid 9/1-positive  Antimicrobials: Vancomycin 9/1 >> Ceftriaxone 9/1 >>   Objective: Vitals:   06/18/21 0005 06/18/21 0359 06/18/21 0700 06/18/21 0900  BP: 133/77 119/81 111/73   Pulse:  62 (!) 57 76  Resp:  16 11 (!) 21  Temp: 99.6 F (37.6 C) 99 F (37.2 C) 98.8 F (37.1 C)   TempSrc: Oral Oral Oral   SpO2:  97% 99% 99%    Intake/Output Summary (Last 24 hours)  at 06/18/2021 1115 Last data filed at 06/18/2021 1100 Gross per 24 hour  Intake 1296.17 ml  Output 1500 ml  Net -203.83 ml   There were no vitals filed for this visit.  Examination:  Constitutional: NAD but uncomfortable Eyes: no scleral icterus ENMT: Mucous membranes are moist.  Neck: normal, supple Respiratory: clear to auscultation bilaterally, no wheezing, no crackles. Cardiovascular: Regular  rate and rhythm, no murmurs / rubs / gallops. Abdomen: non distended, no tenderness. Bowel sounds positive.  Musculoskeletal: no clubbing / cyanosis.  Skin: no rashes Neurologic: CN 2-12 grossly intact. Strength 5/5 in all 4.  Psychiatric: Normal judgment and insight. Alert and oriented x 3. Normal mood.    Data Reviewed: I have independently reviewed following labs and imaging studies   CBC: Recent Labs  Lab 06/17/21 1007 06/18/21 0006  WBC 12.8* 12.0*  NEUTROABS 9.7*  --   HGB 11.9* 11.7*  HCT 36.7* 35.3*  MCV 82.3 80.4  PLT 104* 116*   Basic Metabolic Panel: Recent Labs  Lab 06/17/21 1007 06/18/21 0006  NA 133* 134*  K 4.0 3.7  CL 99 102  CO2 26 23  GLUCOSE 122* 204*  BUN 12 14  CREATININE 0.78 0.91  CALCIUM 8.9 8.4*   Liver Function Tests: Recent Labs  Lab 06/17/21 1007  AST 13*  ALT 9  ALKPHOS 83  BILITOT 0.8  PROT 7.1  ALBUMIN 3.1*   Coagulation Profile: Recent Labs  Lab 06/17/21 1007  INR 1.0   HbA1C: No results for input(s): HGBA1C in the last 72 hours. CBG: No results for input(s): GLUCAP in the last 168 hours.  Recent Results (from the past 240 hour(s))  Culture, blood (routine x 2)     Status: None (Preliminary result)   Collection Time: 06/17/21  2:03 PM   Specimen: BLOOD LEFT HAND  Result Value Ref Range Status   Specimen Description BLOOD LEFT HAND  Final   Special Requests   Final    BOTTLES DRAWN AEROBIC AND ANAEROBIC Blood Culture adequate volume   Culture   Final    NO GROWTH < 24 HOURS Performed at [facility], [address] [place], [place] [address]    Report Status PENDING  Incomplete  Resp Panel by RT-PCR (Flu A&B, Covid) Nasopharyngeal Swab     Status: Abnormal   Collection Time: 06/17/21  6:24 PM   Specimen: Nasopharyngeal Swab; Nasopharyngeal(NP) swabs in vial transport medium  Result Value Ref Range Status   SARS Coronavirus 2 by RT PCR POSITIVE (A) NEGATIVE Final    Comment: RESULT CALLED TO, READ BACK BY  AND VERIFIED WITH: GRACE TATE RN 06/17/21 @2032  BY JW (NOTE) SARS-CoV-2 target nucleic acids are DETECTED.  The SARS-CoV-2 RNA is generally detectable in upper respiratory specimens during the acute phase of infection. Positive results are indicative of the presence of the identified virus, but do not rule out bacterial infection or co-infection with other pathogens not detected by the test. Clinical correlation with patient history and other diagnostic information is necessary to determine patient infection status. The expected result is Negative.  Fact Sheet for Patients: BloggerCourse.comhttps://www.fda.gov/media/152166/download  Fact Sheet for Healthcare Providers: SeriousBroker.ithttps://www.fda.gov/media/152162/download  This test is not yet approved or cleared by the [place] FDA and  has been authorized for detection and/or diagnosis of SARS-CoV-2 by FDA under an Emergency Use Authorization (EUA).  This EUA will remain in effect (meaning this test can be  used) for the duration of  the COVID-19 declaration under  Section 564(b)(1) of the Act, 21 U.S.C. section 360bbb-3(b)(1), unless the authorization is terminated or revoked sooner.     Influenza A by PCR NEGATIVE NEGATIVE Final   Influenza B by PCR NEGATIVE NEGATIVE Final    Comment: (NOTE) The Xpert Xpress SARS-CoV-2/FLU/RSV plus assay is intended as an aid in the diagnosis of influenza from Nasopharyngeal swab specimens and should not be used as a sole basis for treatment. Nasal washings and aspirates are unacceptable for Xpert Xpress SARS-CoV-2/FLU/RSV testing.  Fact Sheet for Patients: BloggerCourse.com  Fact Sheet for Healthcare Providers: SeriousBroker.it  This test is not yet approved or cleared by the [place] FDA and has been authorized for detection and/or diagnosis of SARS-CoV-2 by FDA under an Emergency Use Authorization (EUA). This EUA will remain in effect (meaning this test  can be used) for the duration of the COVID-19 declaration under Section 564(b)(1) of the Act, 21 U.S.C. section 360bbb-3(b)(1), unless the authorization is terminated or revoked.  Performed at [facility], [address] [place], [place] [address]   Culture, blood (Routine X 2) w Reflex to ID Panel     Status: None (Preliminary result)   Collection Time: 06/18/21 12:06 AM   Specimen: BLOOD  Result Value Ref Range Status   Specimen Description BLOOD SITE NOT SPECIFIED  Final   Special Requests   Final    BOTTLES DRAWN AEROBIC AND ANAEROBIC Blood Culture adequate volume   Culture   Final    NO GROWTH < 12 HOURS Performed at [facility], [address] [place], [place] [address]    Report Status PENDING  Incomplete     Radiology Studies: MR Cervical Spine W or Wo Contrast  Result Date: 06/17/2021 CLINICAL DATA:  Cervical radiculopathy, infection suspected. Additional history provided: Neck pain for 2 days, cervical radiculopathy, mid back pain, infection suspected. EXAM: MRI CERVICAL SPINE WITHOUT AND WITH CONTRAST TECHNIQUE: Multiplanar and multiecho pulse sequences of the cervical spine, to include the craniocervical junction and cervicothoracic junction, were obtained without and with intravenous contrast. CONTRAST:  32mL GADAVIST GADOBUTROL 1 MMOL/ML IV SOLN COMPARISON:  Cervical spine radiographs 06/17/2021. CT of the cervical spine 10/22/2010. FINDINGS: Mild intermittent motion degradation. Alignment: Straightening of the expected cervical lordosis. No significant spondylolisthesis. Vertebrae: Vertebral body height is maintained. No significant marrow edema or focal suspicious osseous lesion. Cord: No spinal cord signal abnormality. Contact upon the ventral spinal cord at C5-C6, as described below. Posterior Fossa, vertebral arteries, paraspinal tissues: No abnormality identified within included portions of the posterior fossa. Flow voids preserved within the imaged cervical  vertebral arteries. There is abnormal enhancement ventral to the vertebral bodies greatest at the C4-C6 levels. Additionally, there is abnormal enhancement within the ventral aspect of the spinal canal, also most notably at the C4-C6 levels. These findings are highly suspicious for epidural and paraspinal phlegmon (best appreciated on series 19, image 6). There is resultant spinal canal narrowing at the C5-C6 level, as described below. Disc levels: Mild multilevel disc degeneration, greatest at C5-C6 and C6-C7. C2-C3: No significant disc herniation or stenosis. C3-C4: Minimal uncovertebral hypertrophy (greater on the right). No significant disc herniation or stenosis. C4-C5: Minimal uncovertebral hypertrophy (greater on the right). No significant disc herniation or spinal canal stenosis at the disc level. Abnormal enhancing soft tissue within the ventral aspect of the spinal canal at the C5 vertebral body level, highly suspicious for phlegmon, contributing to mild/moderate spinal canal stenosis with contact upon the ventral spinal cord. C5-C6: Disc  bulge with bilateral uncovertebral hypertrophy. Superimposed central disc extrusion with cranial migration to the lower C5 vertebral body level (series 19, image 5). Superimposed abnormal enhancing soft tissue within the ventral aspect of the spinal canal highly suspicious for phlegmon, contributing to multifactorial moderate spinal canal stenosis, contacting and minimally flattening the ventral aspect of the spinal cord. No significant degenerative foraminal stenosis. However, abnormal enhancement suspicious for phlegmon extends into the bilateral neural foramina (series 20, image 16). C6-C7: Disc bulge asymmetric to the right with endplate spurring and right-sided uncovertebral hypertrophy. Superimposed abnormal enhancing soft tissue within the ventral aspect of the spinal canal highly suspicious for epidural phlegmon. Mild relative spinal canal narrowing (without  spinal cord mass effect). Moderate degenerative right neural foraminal narrowing. No significant left foraminal stenosis. C7-T1: Mild facet arthrosis. No significant disc herniation or stenosis. IMPRESSION: Abnormal enhancement ventral to the cervical vertebral bodies and within the ventral aspect of the spinal canal, greatest at the C4-C6 levels, and highly suspicious for soft tissue infection with epidural/paraspinal phlegmon. Superimposed cervical spondylosis, including a cranially migrated central disc extrusion at C5-C6. Multilevel spinal canal stenosis. Most notably at C5-C6, a disc herniation and suspected ventral epidural phlegmon contribute to moderate spinal canal stenosis, contacting and mildly flattening the ventral spinal cord. Abnormal enhancement also suspicious for phlegmon extends into the bilateral C5-C6 neural foramina. At C6-C7, there is moderate degenerative right foraminal stenosis. Electronically Signed   By: Jackey Loge D.O.   On: 06/17/2021 13:17   MR THORACIC SPINE W WO CONTRAST  Result Date: 06/17/2021 CLINICAL DATA:  Epidural abscess; IV drug abuse, rule out epidural abscess. Additional history provided: Neck pain for 2 days. Cervical radiculopathy, mid back pain, infection suspected. EXAM: MRI THORACIC WITHOUT AND WITH CONTRAST TECHNIQUE: Multiplanar and multiecho pulse sequences of the thoracic spine were obtained without and with intravenous contrast. CONTRAST:  8mL GADAVIST GADOBUTROL 1 MMOL/ML IV SOLN COMPARISON:  Chest CT 05/07/2018. FINDINGS: Alignment:  No significant spondylolisthesis. Vertebrae: Redemonstrated mild chronic T7 and T8 vertebral compression deformities. Trace edema along the T8 inferior endplate appears degenerative. Elsewhere, no significant marrow edema is identified within the thoracic spine. T12 inferior endplate Schmorl node. Cord:  No spinal cord signal abnormality is identified. Paraspinal and other soft tissues: No abnormality identified within included  portions of the thorax or upper abdomen/retroperitoneum. Paraspinal soft tissues unremarkable. No abnormal enhancement within the thoracic spinal canal. Disc levels: Intervertebral disc height and hydration are maintained. Small T2-T3 central disc protrusion (without significant spinal canal stenosis). No significant disc herniation, spinal canal stenosis or neural foraminal narrowing at the remaining levels. IMPRESSION: No evidence of discitis/osteomyelitis or paraspinal soft tissue infection at the thoracic levels. Minimal edema along the T8 inferior endplate appears degenerative. Redemonstrated mild chronic T7 and T8 vertebral compression deformities. Small central disc protrusion at T2-T3 without significant spinal canal stenosis. Electronically Signed   By: Jackey Loge D.O.   On: 06/17/2021 13:24     Pamella Pert, MD, PhD [facility]  Between 7 am - 7 pm I am available, please contact me via Amion (for emergencies) or Securechat (non urgent messages)  Between 7 pm - 7 am I am not available, please contact night coverage MD/APP via Amion

## 2021-06-18 NOTE — Progress Notes (Signed)
Pt returned from the procedure to the unit. Alert, oriented. Emotional. Incision looks clean, dry and intact with honeycomb dressing.VS stable. Report was given to upcoming nurse. Bed in low position, alarms are on, call bell in reach, all connection checked.

## 2021-06-19 ENCOUNTER — Other Ambulatory Visit (HOSPITAL_COMMUNITY): Payer: Self-pay

## 2021-06-19 ENCOUNTER — Other Ambulatory Visit: Payer: Self-pay

## 2021-06-19 ENCOUNTER — Inpatient Hospital Stay (HOSPITAL_COMMUNITY): Payer: Self-pay

## 2021-06-19 DIAGNOSIS — I38 Endocarditis, valve unspecified: Secondary | ICD-10-CM

## 2021-06-19 DIAGNOSIS — Z5900 Homelessness unspecified: Secondary | ICD-10-CM

## 2021-06-19 LAB — BASIC METABOLIC PANEL
Anion gap: 6 (ref 5–15)
BUN: 13 mg/dL (ref 6–20)
CO2: 25 mmol/L (ref 22–32)
Calcium: 8.8 mg/dL — ABNORMAL LOW (ref 8.9–10.3)
Chloride: 102 mmol/L (ref 98–111)
Creatinine, Ser: 0.73 mg/dL (ref 0.61–1.24)
GFR, Estimated: >60 mL/min (ref >60)
Glucose, Bld: 170 mg/dL — ABNORMAL HIGH (ref 70–99)
Potassium: 4.6 mmol/L (ref 3.5–5.1)
Sodium: 133 mmol/L — ABNORMAL LOW (ref 135–145)

## 2021-06-19 LAB — ECHOCARDIOGRAM LIMITED
Area-P 1/2: 3.31 cm2
Height: 69 in
S' Lateral: 2.2 cm
Weight: 2278.67 oz

## 2021-06-19 MED ORDER — PHENOL 1.4 % MT LIQD
1.0000 | OROMUCOSAL | Status: DC | PRN
  Administered 2021-06-19: 1 via OROMUCOSAL
  Filled 2021-06-19: qty 177

## 2021-06-19 MED ORDER — OXYCODONE HCL 5 MG PO TABS
10.0000 mg | ORAL_TABLET | ORAL | Status: DC | PRN
  Administered 2021-06-19 – 2021-06-20 (×6): 10 mg via ORAL
  Filled 2021-06-19 (×6): qty 2

## 2021-06-19 MED ORDER — VANCOMYCIN HCL IN DEXTROSE 1-5 GM/200ML-% IV SOLN
1000.0000 mg | Freq: Three times a day (TID) | INTRAVENOUS | Status: DC
  Administered 2021-06-19 – 2021-06-21 (×5): 1000 mg via INTRAVENOUS
  Filled 2021-06-19 (×8): qty 200

## 2021-06-19 NOTE — Progress Notes (Signed)
PROGRESS NOTE  Gerald Oliver XFG:[id number] DOB: 10-22-1990 DOA: 06/17/2021 PCP: Patient, No Pcp Per (Inactive)   LOS: 2 days   Brief Narrative / Interim history: 30 year old male with history of IV drug use, prior heroin overdose in May 2022 comes into the hospital with upper back pain.  This is been going on for the last several days progressively getting worse.  He also reports subjective fevers.  He also smokes tobacco, marijuana and occasionally drinks EtOH.  He uses heroin regularly, last used 2 days prior to admission.  Of note, his brother is also hospitalized with IV drug use related complications.  On admission, he was noted to be incidentally COVID-positive.  MRI on admission showed an epidural abscess, neurosurgery consulted  Subjective / 24h Interval events: Reports having neck soreness and sore throat.  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  The phlegmon appears to extend into the bilateral C5-C6 neural foramina.  Neurosurgery consulted and patient was taken to the operating room on 06/18/2021, status post discectomy at the C5-6 level for the thecal sac decompression and epidural abscess debridement, also received intervertebral biomechanical lordotic cage as well as anterior instrumentation with interbody plate. -Pain control alternating with IV Dilaudid and p.o. oxycodone, will attempt to wean off in the upcoming days. -Appreciate neurosurgical follow-up  Active Problems Heroin, polysubstance abuse-perioperatively we will use narcotic pain medication for anticipated significant pain.  Hopefully next week we will attempt to wean off/start a detox protocol -2D echo pending, follow results  Covid positive-no significant respiratory symptoms related to COVID.  Chest x-ray negative.  Recommend 10-day  isolation until 9/11, no treatment necessary at this point  [facility] consulted  Scheduled Meds:  docusate sodium  100 mg Oral BID   feeding supplement  237 mL Oral BID BM   multivitamin with minerals  1 tablet Oral Daily   sodium chloride flush  3 mL Intravenous Q12H   Continuous Infusions:  cefTRIAXone (ROCEPHIN)  IV 2 g (06/19/21 1107)   vancomycin     PRN Meds:.acetaminophen **OR** acetaminophen, bisacodyl, dicyclomine, hydrALAZINE, HYDROmorphone (DILAUDID) injection, hydrOXYzine, ondansetron **OR** ondansetron (ZOFRAN) IV, oxyCODONE, phenol, polyethylene glycol  Diet Orders (From admission, onward)     Start     Ordered   06/18/21 2111  Diet regular Room service appropriate? Yes; Fluid consistency: Thin  Diet effective now       Question Answer Comment  Room service appropriate? Yes   Fluid consistency: Thin      06/18/21 2110            DVT prophylaxis: SCDs Start: 06/17/21 1716     Code Status: Full Code  Family Communication: No family at bedside  Status is: Inpatient  Remains inpatient appropriate because:Inpatient level of care appropriate due to severity of illness  Dispo: The patient is from: Home              Anticipated d/c is to: Home              Patient currently is not medically stable to d/c.   Difficult to place patient No   Level of care: Telemetry Medical  Consultants:  Neurosurgery Infectious disease  Procedures:  2D echo: Pending  OR with Dr. Conchita Paris 9/2 PROCEDURE: 1. Discectomy at C5-6 for decompression of thecal sac and debridement of epidural abscess 2. Placement of intervertebral biomechanical  device Medtronic Titan 6mm lordotic cage 3. Placement of anterior instrumentation consisting of interbody plate and screws - Medtronic Zevo 17mm plate 13mm screws  4. Use of morselized bone allograft  5. Arthrodesis C5-6, anterior interbody technique  6. Use of intraoperative microscope  Microbiology  Blood cultures  9/1-pending RT-PCR for Covid 9/1-positive  Antimicrobials: Vancomycin 9/1 >> Ceftriaxone 9/1 >>   Objective: Vitals:   06/19/21 0545 06/19/21 0700 06/19/21 0916 06/19/21 1320  BP:   119/61 113/81  Pulse:  61 (!) 58 83  Resp:  15 17 (!) 24  Temp:   99.1 F (37.3 C)   TempSrc:   Oral   SpO2:  95% 97% 98%  Weight: 64.6 kg     Height:        Intake/Output Summary (Last 24 hours) at 06/19/2021 1357 Last data filed at 06/19/2021 1008 Gross per 24 hour  Intake 4999.89 ml  Output 3550 ml  Net 1449.89 ml    Filed Weights   06/19/21 0545  Weight: 64.6 kg    Examination:  Constitutional: No distress, Eyes: No scleral icterus ENMT: mmm Neck: normal, supple Respiratory: Clear bilaterally, no wheezing, no crackles Cardiovascular: Regular rate and rhythm, no murmurs, no edema Abdomen: Soft, NT, ND, bowel sounds positive Musculoskeletal: no clubbing / cyanosis.  Skin: No rashes appreciated Neurologic: Nonfocal, equal strength  Data Reviewed: I have independently reviewed following labs and imaging studies   CBC: Recent Labs  Lab 06/17/21 1007 06/18/21 0006  WBC 12.8* 12.0*  NEUTROABS 9.7*  --   HGB 11.9* 11.7*  HCT 36.7* 35.3*  MCV 82.3 80.4  PLT 104* 116*    Basic Metabolic Panel: Recent Labs  Lab 06/17/21 1007 06/18/21 0006 06/19/21 0315  NA 133* 134* 133*  K 4.0 3.7 4.6  CL 99 102 102  CO2 26 23 25   GLUCOSE 122* 204* 170*  BUN 12 14 13   CREATININE 0.78 0.91 0.73  CALCIUM 8.9 8.4* 8.8*    Liver Function Tests: Recent Labs  Lab 06/17/21 1007  AST 13*  ALT 9  ALKPHOS 83  BILITOT 0.8  PROT 7.1  ALBUMIN 3.1*    Coagulation Profile: Recent Labs  Lab 06/17/21 1007  INR 1.0    HbA1C: No results for input(s): HGBA1C in the last 72 hours. CBG: No results for input(s): GLUCAP in the last 168 hours.  Recent Results (from the past 240 hour(s))  Culture, blood (routine x 2)     Status: None (Preliminary result)   Collection Time: 06/17/21   2:03 PM   Specimen: BLOOD LEFT HAND  Result Value Ref Range Status   Specimen Description BLOOD LEFT HAND  Final   Special Requests   Final    BOTTLES DRAWN AEROBIC AND ANAEROBIC Blood Culture adequate volume   Culture   Final    NO GROWTH 2 DAYS Performed at [facility], [address] [place], [place] [id number]    Report Status PENDING  Incomplete  Resp Panel by RT-PCR (Flu A&B, Covid) Nasopharyngeal Swab     Status: Abnormal   Collection Time: 06/17/21  6:24 PM   Specimen: Nasopharyngeal Swab; Nasopharyngeal(NP) swabs in vial transport medium  Result Value Ref Range Status   SARS Coronavirus 2 by RT PCR POSITIVE (A) NEGATIVE Final    Comment: RESULT CALLED TO, READ BACK BY AND VERIFIED WITH: GRACE TATE RN 06/17/21 @2032  BY JW (NOTE) SARS-CoV-2 target nucleic acids are DETECTED.  The SARS-CoV-2 RNA is generally detectable in upper respiratory  specimens during the acute phase of infection. Positive results are indicative of the presence of the identified virus, but do not rule out bacterial infection or co-infection with other pathogens not detected by the test. Clinical correlation with patient history and other diagnostic information is necessary to determine patient infection status. The expected result is Negative.  Fact Sheet for Patients: BloggerCourse.com  Fact Sheet for Healthcare Providers: SeriousBroker.it  This test is not yet approved or cleared by the [place] FDA and  has been authorized for detection and/or diagnosis of SARS-CoV-2 by FDA under an Emergency Use Authorization (EUA).  This EUA will remain in effect (meaning this test can be  used) for the duration of  the COVID-19 declaration under Section 564(b)(1) of the Act, 21 U.S.C. section 360bbb-3(b)(1), unless the authorization is terminated or revoked sooner.     Influenza A by PCR NEGATIVE NEGATIVE Final   Influenza B by PCR NEGATIVE  NEGATIVE Final    Comment: (NOTE) The Xpert Xpress SARS-CoV-2/FLU/RSV plus assay is intended as an aid in the diagnosis of influenza from Nasopharyngeal swab specimens and should not be used as a sole basis for treatment. Nasal washings and aspirates are unacceptable for Xpert Xpress SARS-CoV-2/FLU/RSV testing.  Fact Sheet for Patients: BloggerCourse.com  Fact Sheet for Healthcare Providers: SeriousBroker.it  This test is not yet approved or cleared by the [place] FDA and has been authorized for detection and/or diagnosis of SARS-CoV-2 by FDA under an Emergency Use Authorization (EUA). This EUA will remain in effect (meaning this test can be used) for the duration of the COVID-19 declaration under Section 564(b)(1) of the Act, 21 U.S.C. section 360bbb-3(b)(1), unless the authorization is terminated or revoked.  Performed at [facility], [address], [place], [place] [address]   Culture, blood (Routine X 2) w Reflex to ID Panel     Status: None (Preliminary result)   Collection Time: 06/18/21 12:06 AM   Specimen: BLOOD  Result Value Ref Range Status   Specimen Description BLOOD SITE NOT SPECIFIED  Final   Special Requests   Final    BOTTLES DRAWN AEROBIC AND ANAEROBIC Blood Culture adequate volume   Culture   Final    NO GROWTH 1 DAY Performed at [facility], [address], [place], [place] [address]    Report Status PENDING  Incomplete  Culture, blood (routine x 2)     Status: None (Preliminary result)   Collection Time: 06/18/21  9:26 AM   Specimen: BLOOD RIGHT HAND  Result Value Ref Range Status   Specimen Description BLOOD RIGHT HAND  Final   Special Requests   Final    BOTTLES DRAWN AEROBIC AND ANAEROBIC Blood Culture adequate volume   Culture   Final    NO GROWTH < 24 HOURS Performed at [facility], [address], [place], [place] [address]    Report Status PENDING  Incomplete   Culture, blood (routine x 2)     Status: None (Preliminary result)   Collection Time: 06/18/21  9:30 AM   Specimen: BLOOD RIGHT WRIST  Result Value Ref Range Status   Specimen Description BLOOD RIGHT WRIST  Final   Special Requests   Final    BOTTLES DRAWN AEROBIC ONLY Blood Culture results may not be optimal due to an inadequate volume of blood received in culture bottles   Culture   Final    NO GROWTH < 24 HOURS Performed at [facility], [address] [place], [place] [address]    Report Status PENDING  Incomplete      Radiology Studies: DG Cervical Spine 2 or 3 views  Result Date: 06/18/2021 CLINICAL DATA:  Elective ACDF. EXAM: CERVICAL SPINE - 2-3 VIEW; DG C-ARM 1-60 MIN-NO REPORT COMPARISON:  Cervical spine radiographs 06/17/2021.  MRI 06/17/2021. FINDINGS: C-arm fluoroscopy was provided in the operating room. 5 seconds of fluoroscopy time. 0.51 mGy. Two lateral spot fluoroscopic images of the cervical spine are submitted postoperatively. The initial image demonstrates anterior localization of the C5-6 disc space with a needle. Subsequent image demonstrates interval anterior discectomy and fusion at C5-6 with an anterior plate, screws and interbody spacer. The inferior extent of the surgical hardware is incompletely visualized on this C-arm image. IMPRESSION: Intraoperative views during C5-6 ACDF. No demonstrated complication. Electronically Signed   By: Carey Bullocks M.D.   On: 06/18/2021 20:16   DG C-Arm 1-60 Min-No Report  Result Date: 06/18/2021 Fluoroscopy was utilized by the requesting physician.  No radiographic interpretation.     Pamella Pert, MD, PhD [facility]  Between 7 am - 7 pm I am available, please contact me via Amion (for emergencies) or Securechat (non urgent messages)  Between 7 pm - 7 am I am not available, please contact night coverage MD/APP via Amion

## 2021-06-19 NOTE — Progress Notes (Signed)
  Echocardiogram 2D Echocardiogram has been performed.  Gerald Oliver 06/19/2021, 5:01 PM

## 2021-06-19 NOTE — Progress Notes (Signed)
Providing Compassionate, Quality Care - Together   Subjective: Entered the room to patient sleeping. When awakened for assessment, he immediately begins complaining of sore throat and neck pain.  Objective: Vital signs in last 24 hours: Temp:  [98.9 F (37.2 C)-99.5 F (37.5 C)] 99 F (37.2 C) (09/03 0419) Pulse Rate:  [52-101] 61 (09/03 0700) Resp:  [8-20] 15 (09/03 0700) BP: (104-157)/(56-91) 104/66 (09/03 0419) SpO2:  [92 %-100 %] 95 % (09/03 0700) Weight:  [64.6 kg] 64.6 kg (09/03 0545)  Intake/Output from previous day: 09/02 0701 - 09/03 0700 In: 4241.2 [P.O.:1200; I.V.:2615.2; IV Piggyback:426] Out: 3950 [Urine:3950] Intake/Output this shift: Total I/O In: 75.1 [I.V.:75.1] Out: -   Alert and oriented x 4 PERRLA Speech clear CN II-XII grossly intact MAE, Strength and sensation intact Incision is covered with Honeycomb dressing and Steri Strips; Dressing is clean, dry, and intact   Lab Results: Recent Labs    06/17/21 1007 06/18/21 0006  WBC 12.8* 12.0*  HGB 11.9* 11.7*  HCT 36.7* 35.3*  PLT 104* 116*   BMET Recent Labs    06/17/21 1007 06/18/21 0006  NA 133* 134*  K 4.0 3.7  CL 99 102  CO2 26 23  GLUCOSE 122* 204*  BUN 12 14  CREATININE 0.78 0.91  CALCIUM 8.9 8.4*    Studies/Results: DG Chest 1 View  Result Date: 06/17/2021 CLINICAL DATA:  Neck pain for 2 days.  Pain in chest. EXAM: CHEST  1 VIEW COMPARISON:  02/22/2021 FINDINGS: The heart size and mediastinal contours are within normal limits. Both lungs are clear. The visualized skeletal structures are unremarkable. IMPRESSION: No active disease. Electronically Signed   By: Signa Kell M.D.   On: 06/17/2021 10:51   DG Cervical Spine 2 or 3 views  Result Date: 06/18/2021 CLINICAL DATA:  Elective ACDF. EXAM: CERVICAL SPINE - 2-3 VIEW; DG C-ARM 1-60 MIN-NO REPORT COMPARISON:  Cervical spine radiographs 06/17/2021.  MRI 06/17/2021. FINDINGS: C-arm fluoroscopy was provided in the operating  room. 5 seconds of fluoroscopy time. 0.51 mGy. Two lateral spot fluoroscopic images of the cervical spine are submitted postoperatively. The initial image demonstrates anterior localization of the C5-6 disc space with a needle. Subsequent image demonstrates interval anterior discectomy and fusion at C5-6 with an anterior plate, screws and interbody spacer. The inferior extent of the surgical hardware is incompletely visualized on this C-arm image. IMPRESSION: Intraoperative views during C5-6 ACDF. No demonstrated complication. Electronically Signed   By: Carey Bullocks M.D.   On: 06/18/2021 20:16   MR Cervical Spine W or Wo Contrast  Result Date: 06/17/2021 CLINICAL DATA:  Cervical radiculopathy, infection suspected. Additional history provided: Neck pain for 2 days, cervical radiculopathy, mid back pain, infection suspected. EXAM: MRI CERVICAL SPINE WITHOUT AND WITH CONTRAST TECHNIQUE: Multiplanar and multiecho pulse sequences of the cervical spine, to include the craniocervical junction and cervicothoracic junction, were obtained without and with intravenous contrast. CONTRAST:  14mL GADAVIST GADOBUTROL 1 MMOL/ML IV SOLN COMPARISON:  Cervical spine radiographs 06/17/2021. CT of the cervical spine 10/22/2010. FINDINGS: Mild intermittent motion degradation. Alignment: Straightening of the expected cervical lordosis. No significant spondylolisthesis. Vertebrae: Vertebral body height is maintained. No significant marrow edema or focal suspicious osseous lesion. Cord: No spinal cord signal abnormality. Contact upon the ventral spinal cord at C5-C6, as described below. Posterior Fossa, vertebral arteries, paraspinal tissues: No abnormality identified within included portions of the posterior fossa. Flow voids preserved within the imaged cervical vertebral arteries. There is abnormal enhancement ventral to the vertebral  bodies greatest at the C4-C6 levels. Additionally, there is abnormal enhancement within the ventral  aspect of the spinal canal, also most notably at the C4-C6 levels. These findings are highly suspicious for epidural and paraspinal phlegmon (best appreciated on series 19, image 6). There is resultant spinal canal narrowing at the C5-C6 level, as described below. Disc levels: Mild multilevel disc degeneration, greatest at C5-C6 and C6-C7. C2-C3: No significant disc herniation or stenosis. C3-C4: Minimal uncovertebral hypertrophy (greater on the right). No significant disc herniation or stenosis. C4-C5: Minimal uncovertebral hypertrophy (greater on the right). No significant disc herniation or spinal canal stenosis at the disc level. Abnormal enhancing soft tissue within the ventral aspect of the spinal canal at the C5 vertebral body level, highly suspicious for phlegmon, contributing to mild/moderate spinal canal stenosis with contact upon the ventral spinal cord. C5-C6: Disc bulge with bilateral uncovertebral hypertrophy. Superimposed central disc extrusion with cranial migration to the lower C5 vertebral body level (series 19, image 5). Superimposed abnormal enhancing soft tissue within the ventral aspect of the spinal canal highly suspicious for phlegmon, contributing to multifactorial moderate spinal canal stenosis, contacting and minimally flattening the ventral aspect of the spinal cord. No significant degenerative foraminal stenosis. However, abnormal enhancement suspicious for phlegmon extends into the bilateral neural foramina (series 20, image 16). C6-C7: Disc bulge asymmetric to the right with endplate spurring and right-sided uncovertebral hypertrophy. Superimposed abnormal enhancing soft tissue within the ventral aspect of the spinal canal highly suspicious for epidural phlegmon. Mild relative spinal canal narrowing (without spinal cord mass effect). Moderate degenerative right neural foraminal narrowing. No significant left foraminal stenosis. C7-T1: Mild facet arthrosis. No significant disc  herniation or stenosis. IMPRESSION: Abnormal enhancement ventral to the cervical vertebral bodies and within the ventral aspect of the spinal canal, greatest at the C4-C6 levels, and highly suspicious for soft tissue infection with epidural/paraspinal phlegmon. Superimposed cervical spondylosis, including a cranially migrated central disc extrusion at C5-C6. Multilevel spinal canal stenosis. Most notably at C5-C6, a disc herniation and suspected ventral epidural phlegmon contribute to moderate spinal canal stenosis, contacting and mildly flattening the ventral spinal cord. Abnormal enhancement also suspicious for phlegmon extends into the bilateral C5-C6 neural foramina. At C6-C7, there is moderate degenerative right foraminal stenosis. Electronically Signed   By: Jackey Loge D.O.   On: 06/17/2021 13:17   MR THORACIC SPINE W WO CONTRAST  Result Date: 06/17/2021 CLINICAL DATA:  Epidural abscess; IV drug abuse, rule out epidural abscess. Additional history provided: Neck pain for 2 days. Cervical radiculopathy, mid back pain, infection suspected. EXAM: MRI THORACIC WITHOUT AND WITH CONTRAST TECHNIQUE: Multiplanar and multiecho pulse sequences of the thoracic spine were obtained without and with intravenous contrast. CONTRAST:  64mL GADAVIST GADOBUTROL 1 MMOL/ML IV SOLN COMPARISON:  Chest CT 05/07/2018. FINDINGS: Alignment:  No significant spondylolisthesis. Vertebrae: Redemonstrated mild chronic T7 and T8 vertebral compression deformities. Trace edema along the T8 inferior endplate appears degenerative. Elsewhere, no significant marrow edema is identified within the thoracic spine. T12 inferior endplate Schmorl node. Cord:  No spinal cord signal abnormality is identified. Paraspinal and other soft tissues: No abnormality identified within included portions of the thorax or upper abdomen/retroperitoneum. Paraspinal soft tissues unremarkable. No abnormal enhancement within the thoracic spinal canal. Disc levels:  Intervertebral disc height and hydration are maintained. Small T2-T3 central disc protrusion (without significant spinal canal stenosis). No significant disc herniation, spinal canal stenosis or neural foraminal narrowing at the remaining levels. IMPRESSION: No evidence of discitis/osteomyelitis or paraspinal soft tissue  infection at the thoracic levels. Minimal edema along the T8 inferior endplate appears degenerative. Redemonstrated mild chronic T7 and T8 vertebral compression deformities. Small central disc protrusion at T2-T3 without significant spinal canal stenosis. Electronically Signed   By: Jackey Loge D.O.   On: 06/17/2021 13:24   DG C-Arm 1-60 Min-No Report  Result Date: 06/18/2021 Fluoroscopy was utilized by the requesting physician.  No radiographic interpretation.    Assessment/Plan: Patient underwent discectomy at C5-6 for decompression of thecal sac and debridement of epidural abscess, followed by placement of intervertebral cage and anterior plate and screws by Dr. Conchita Paris on 06/18/2021. He has history of IVDU with two overdoses, Hep C, and is COVID positive (asymptomatic).   LOS: 2 days   -Abx per ID -Continue supportive care -Mobilize in room  Val Eagle, DNP, AGNP-C Nurse Practitioner  [facility] [address], [place], [place] [address] P: [phone]    F: [phone]  06/19/2021, 9:45 AM

## 2021-06-19 NOTE — Progress Notes (Signed)
Pharmacy Antibiotic Note  Gerald Oliver is a 30 y.o. male admitted on 06/17/2021 with  spinal epidural abscess . Decompression and debridement performed 9/2. Patient's renal function has improved 9/2>>9/3 (Scr 0.91>0.73) requiring adjustment to vancomycin dosing.  Pharmacy has been consulted for vancomycin dosing.  Scr WNL - 0.71   Plan: -Vancomycin 1000 mg Q8H (nomogram dosing)  - Continue ceftriaxone 2g IV Q12H  -Monitor CBC, renal fx, cultures, ECHO, ID recs  -Vanc levels as indicated    Height: 5\' 9"  (175.3 cm) Weight: 64.6 kg (142 lb 6.7 oz) IBW/kg (Calculated) : 70.7  Temp (24hrs), Avg:99.1 F (37.3 C), Min:98.9 F (37.2 C), Max:99.5 F (37.5 C)  Recent Labs  Lab 06/17/21 1007 06/17/21 1009 06/17/21 1041 06/18/21 0006 06/19/21 0315  WBC 12.8*  --   --  12.0*  --   CREATININE 0.78  --   --  0.91 0.73  LATICACIDVEN  --  0.9 0.9  --   --      Estimated Creatinine Clearance: 121.1 mL/min (by C-G formula based on SCr of 0.73 mg/dL).    No Known Allergies  Antimicrobials this admission: Meropenem 9/1 >>9/2  Vanc 9/1 >>  Ceftriaxone 9/2>>  Dose adjustments this admission: Vanc 750 mg IV Q8H 9/2>> Vanc 1000 mg IV Q8H 9/3   Microbiology results: 9/1 Bcx: ngtd  9/2 BCx: ngtd  9/2 Wound cx: sent   Thank you for allowing pharmacy to be a part of this patient's care.  11/2, PharmD PGY-1 Acute Care Resident  06/19/2021 1:29 PM

## 2021-06-20 ENCOUNTER — Encounter (HOSPITAL_COMMUNITY): Payer: Self-pay | Admitting: Internal Medicine

## 2021-06-20 LAB — HCV RNA QUANT: HCV Quantitative: <15 IU/mL — ABNORMAL LOW (ref >50)

## 2021-06-20 MED ORDER — KETOROLAC TROMETHAMINE 15 MG/ML IJ SOLN
15.0000 mg | Freq: Three times a day (TID) | INTRAMUSCULAR | Status: AC
  Administered 2021-06-20 – 2021-06-23 (×9): 15 mg via INTRAVENOUS
  Filled 2021-06-20 (×9): qty 1

## 2021-06-20 MED ORDER — OXYCODONE HCL 5 MG PO TABS
15.0000 mg | ORAL_TABLET | ORAL | Status: DC | PRN
Start: 2021-06-20 — End: 2021-06-22
  Administered 2021-06-20 – 2021-06-22 (×10): 15 mg via ORAL
  Filled 2021-06-20 (×10): qty 3

## 2021-06-20 NOTE — Progress Notes (Signed)
PROGRESS NOTE  Gerald ShockMichael J Oliver JYN:[id number] DOB: 1991-06-08 DOA: 06/17/2021 PCP: Patient, No Pcp Per (Inactive)   LOS: 3 days   Brief Narrative / Interim history: 30 year old male with history of IV drug use, prior heroin overdose in May 2022 comes into the hospital with upper back pain.  This is been going on for the last several days progressively getting worse.  He also reports subjective fevers.  He also smokes tobacco, marijuana and occasionally drinks EtOH.  He uses heroin regularly, last used 2 days prior to admission.  Of note, his brother is also hospitalized with IV drug use related complications.  On admission, he was noted to be incidentally COVID-positive.  MRI on admission showed an epidural abscess, neurosurgery consulted  Subjective / 24h Interval events: Reports severe neck pain, neck soreness, sore throat.  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  The phlegmon appears to extend into the bilateral C5-C6 neural foramina.  Neurosurgery consulted and patient was taken to the operating room on 06/18/2021, status post discectomy at the C5-6 level for the thecal sac decompression and epidural abscess debridement, also received intervertebral biomechanical lordotic cage as well as anterior instrumentation with interbody plate. -Pain control alternating with IV Dilaudid and p.o. oxycodone, patient in severe pain, will not change Dilaudid dose but increase oxycodone and add Toradol as an anti-inflammatory -Appreciate neurosurgical follow-up  Active Problems Heroin, polysubstance abuse-perioperatively we will use narcotic pain medication for anticipated significant pain.  Hopefully next week we will attempt to wean off/start a detox protocol -2D echo shows an EF of 70-75%, LV has hyperdynamic function, no WMA.  RV was  normal.  No evidence of valvular vegetations.  Covid positive-no significant respiratory symptoms related to COVID.  Chest x-ray negative.  Recommend 10-day isolation until 9/11, no treatment necessary at this point  [facility]  Scheduled Meds:  docusate sodium  100 mg Oral BID   feeding supplement  237 mL Oral BID BM   ketorolac  15 mg Intravenous Q8H   multivitamin with minerals  1 tablet Oral Daily   sodium chloride flush  3 mL Intravenous Q12H   Continuous Infusions:  cefTRIAXone (ROCEPHIN)  IV 2 g (06/20/21 0838)   vancomycin 1,000 mg (06/20/21 1035)   PRN Meds:.acetaminophen **OR** acetaminophen, bisacodyl, dicyclomine, hydrALAZINE, HYDROmorphone (DILAUDID) injection, hydrOXYzine, ondansetron **OR** ondansetron (ZOFRAN) IV, oxyCODONE, phenol, polyethylene glycol  Diet Orders (From admission, onward)     Start     Ordered   06/18/21 2111  Diet regular Room service appropriate? Yes; Fluid consistency: Thin  Diet effective now       Question Answer Comment  Room service appropriate? Yes   Fluid consistency: Thin      06/18/21 2110            DVT prophylaxis: SCDs Start: 06/17/21 1716     Code Status: Full Code  Family Communication: No family at bedside  Status is: Inpatient  Remains inpatient appropriate because:Inpatient level of care appropriate due to severity of illness  Dispo: The patient is from: Home              Anticipated d/c is to: Home              Patient currently is not medically stable to d/c.   Difficult to place patient No   Level of care: Telemetry Medical  Consultants:  Neurosurgery Infectious disease  Procedures:  2D echo: Pending  OR with Dr. Conchita Paris 9/2 PROCEDURE: 1. Discectomy at C5-6 for decompression of thecal sac and debridement of epidural abscess 2. Placement of intervertebral biomechanical device Medtronic Titan 24mm lordotic cage 3. Placement of anterior instrumentation consisting of interbody plate  and screws - Medtronic Zevo 19mm plate [id number] screws  4. Use of morselized bone allograft  5. Arthrodesis C5-6, anterior interbody technique  6. Use of intraoperative microscope  Microbiology  Blood cultures 9/1-pending RT-PCR for Covid 9/1-positive  Antimicrobials: Vancomycin 9/1 >> Ceftriaxone 9/1 >>   Objective: Vitals:   06/20/21 0425 06/20/21 0917 06/20/21 1100 06/20/21 1200  BP: 122/72 119/73    Pulse:  68 62 70  Resp:  19 12 13   Temp: 99.8 F (37.7 C) 99.6 F (37.6 C)    TempSrc: Oral Oral    SpO2:  100% 96% 98%  Weight: 64.8 kg     Height:        Intake/Output Summary (Last 24 hours) at 06/20/2021 1332 Last data filed at 06/20/2021 0941 Gross per 24 hour  Intake 2299.87 ml  Output 2775 ml  Net -475.13 ml    Filed Weights   06/19/21 0545 06/20/21 0425  Weight: 64.6 kg 64.8 kg    Examination:  Constitutional: NAD, in bed Eyes: No icterus ENMT: Moist mucous membranes Neck: normal, supple Respiratory: Clear bilaterally, no wheezing, no crackles Cardiovascular: Regular rate and rhythm, no murmurs rubs or gallops Abdomen: Soft, nontender, nondistended, bowel sounds positive Musculoskeletal: no clubbing / cyanosis.  Skin: No rashes appreciated, neck incision CDI Neurologic: Nonfocal, equal strength  Data Reviewed: I have independently reviewed following labs and imaging studies   CBC: Recent Labs  Lab 06/17/21 1007 06/18/21 0006  WBC 12.8* 12.0*  NEUTROABS 9.7*  --   HGB 11.9* 11.7*  HCT 36.7* 35.3*  MCV 82.3 80.4  PLT 104* 116*    Basic Metabolic Panel: Recent Labs  Lab 06/17/21 1007 06/18/21 0006 06/19/21 0315  NA 133* 134* 133*  K 4.0 3.7 4.6  CL 99 102 102  CO2 26 23 25   GLUCOSE 122* 204* 170*  BUN 12 14 13   CREATININE 0.78 0.91 0.73  CALCIUM 8.9 8.4* 8.8*    Liver Function Tests: Recent Labs  Lab 06/17/21 1007  AST 13*  ALT 9  ALKPHOS 83  BILITOT 0.8  PROT 7.1  ALBUMIN 3.1*    Coagulation Profile: Recent Labs  Lab  06/17/21 1007  INR 1.0    HbA1C: No results for input(s): HGBA1C in the last 72 hours. CBG: No results for input(s): GLUCAP in the last 168 hours.  Recent Results (from the past 240 hour(s))  Culture, blood (routine x 2)     Status: None (Preliminary result)   Collection Time: 06/17/21  2:03 PM   Specimen: BLOOD LEFT HAND  Result Value Ref Range Status   Specimen Description BLOOD LEFT HAND  Final   Special Requests   Final    BOTTLES DRAWN AEROBIC AND ANAEROBIC Blood Culture adequate volume   Culture   Final    NO GROWTH 3 DAYS Performed at [facility], [address], [place], [facility] [address]    Report Status PENDING  Incomplete  Resp Panel by RT-PCR (Flu A&B, Covid) Nasopharyngeal Swab     Status: Abnormal   Collection Time: 06/17/21  6:24 PM   Specimen: Nasopharyngeal Swab; Nasopharyngeal(NP) swabs in vial transport medium  Result Value Ref Range Status  SARS Coronavirus 2 by RT PCR POSITIVE (A) NEGATIVE Final    Comment: RESULT CALLED TO, READ BACK BY AND VERIFIED WITH: GRACE TATE RN 06/17/21  BY JW (NOTE) SARS-CoV-2 target nucleic acids are DETECTED.  The SARS-CoV-2 RNA is generally detectable in upper respiratory specimens during the acute phase of infection. Positive results are indicative of the presence of the identified virus, but do not rule out bacterial infection or co-infection with other pathogens not detected by the test. Clinical correlation with patient history and other diagnostic information is necessary to determine patient infection status. The expected result is Negative.  Fact Sheet for Patients: BloggerCourse.com  Fact Sheet for Healthcare Providers: SeriousBroker.it  This test is not yet approved or cleared by the [place] FDA and  has been authorized for detection and/or diagnosis of SARS-CoV-2 by FDA under an Emergency Use Authorization (EUA).  This EUA will remain in  effect (meaning this test can be  used) for the duration of  the COVID-19 declaration under Section 564(b)(1) of the Act, 21 U.S.C. section 360bbb-3(b)(1), unless the authorization is terminated or revoked sooner.     Influenza A by PCR NEGATIVE NEGATIVE Final   Influenza B by PCR NEGATIVE NEGATIVE Final    Comment: (NOTE) The Xpert Xpress SARS-CoV-2/FLU/RSV plus assay is intended as an aid in the diagnosis of influenza from Nasopharyngeal swab specimens and should not be used as a sole basis for treatment. Nasal washings and aspirates are unacceptable for Xpert Xpress SARS-CoV-2/FLU/RSV testing.  Fact Sheet for Patients: BloggerCourse.com  Fact Sheet for Healthcare Providers: SeriousBroker.it  This test is not yet approved or cleared by the [place] FDA and has been authorized for detection and/or diagnosis of SARS-CoV-2 by FDA under an Emergency Use Authorization (EUA). This EUA will remain in effect (meaning this test can be used) for the duration of the COVID-19 declaration under Section 564(b)(1) of the Act, 21 U.S.C. section 360bbb-3(b)(1), unless the authorization is terminated or revoked.  Performed at [facility], [address], [place], [place] [address]   Culture, blood (Routine X 2) w Reflex to ID Panel     Status: None (Preliminary result)   Collection Time: 06/18/21 12:06 AM   Specimen: BLOOD  Result Value Ref Range Status   Specimen Description BLOOD SITE NOT SPECIFIED  Final   Special Requests   Final    BOTTLES DRAWN AEROBIC AND ANAEROBIC Blood Culture adequate volume   Culture   Final    NO GROWTH 2 DAYS Performed at [facility], [address], [place], [place] [address]    Report Status PENDING  Incomplete  Culture, blood (routine x 2)     Status: None (Preliminary result)   Collection Time: 06/18/21  9:26 AM   Specimen: BLOOD RIGHT HAND  Result Value Ref Range Status   Specimen  Description BLOOD RIGHT HAND  Final   Special Requests   Final    BOTTLES DRAWN AEROBIC AND ANAEROBIC Blood Culture adequate volume   Culture   Final    NO GROWTH 2 DAYS Performed at [facility], [address], [place], [place] [address]    Report Status PENDING  Incomplete  Culture, blood (routine x 2)     Status: None (Preliminary result)   Collection Time: 06/18/21  9:30 AM   Specimen: BLOOD RIGHT WRIST  Result Value Ref Range Status   Specimen Description BLOOD RIGHT WRIST  Final   Special Requests   Final  BOTTLES DRAWN AEROBIC ONLY Blood Culture results may not be optimal due to an inadequate volume of blood received in culture bottles   Culture   Final    NO GROWTH 2 DAYS Performed at [facility], [address] [place], [place] [address]    Report Status PENDING  Incomplete  Aerobic/Anaerobic Culture w Gram Stain (surgical/deep wound)     Status: None (Preliminary result)   Collection Time: 06/18/21  5:48 PM   Specimen: Abscess; Wound  Result Value Ref Range Status   Specimen Description ABSCESS  Final   Special Requests EPIDURAL  Final   Gram Stain   Final    FEW WBC PRESENT,BOTH PMN AND MONONUCLEAR NO ORGANISMS SEEN    Culture   Final    CULTURE REINCUBATED FOR BETTER GROWTH Performed at [facility], [address], [place], [place] [address]    Report Status PENDING  Incomplete  Aerobic/Anaerobic Culture w Gram Stain (surgical/deep wound)     Status: None (Preliminary result)   Collection Time: 06/18/21  5:53 PM   Specimen: Tissue  Result Value Ref Range Status   Specimen Description TISSUE  Final   Special Requests EPIDURAL  Final   Gram Stain   Final    MODERATE WBC PRESENT,BOTH PMN AND MONONUCLEAR RARE GRAM NEGATIVE RODS    Culture   Final    CULTURE REINCUBATED FOR BETTER GROWTH Performed at [facility], [address] [place], [place] [address]    Report Status PENDING  Incomplete      Radiology  Studies: ECHOCARDIOGRAM LIMITED  Result Date: 06/19/2021    ECHOCARDIOGRAM LIMITED REPORT   Patient Name:   TORRELL KRUTZ Date of Exam: 06/19/2021 Medical Rec #:  [id number]        Height:       69.0 in Accession #:    [id number]       Weight:       142.4 lb Date of Birth:  03-28-1991        BSA:          1.788 m Patient Age:    30 years         BP:           127/71 mmHg Patient Gender: M                HR:           75 bpm. Exam Location:  Inpatient Procedure: 2D Echo, Cardiac Doppler and Color Doppler Indications:    Endocardititis  History:        Patient has prior history of Echocardiogram examinations, most                 recent 05/07/2018. Polysubstance abuse. Sepsis. COVID-19.  Sonographer:    Ross Ludwig RDCS (AE) Referring Phys: [id number] [facility] IMPRESSIONS  1. Left ventricular ejection fraction, by estimation, is 70 to 75%. The left ventricle has hyperdynamic function. The left ventricle has no regional wall motion abnormalities. There is mild concentric left ventricular hypertrophy. Left ventricular diastolic parameters were normal.  2. Right ventricular systolic function is normal. The right ventricular size is normal.  3. The mitral valve is normal in structure. No evidence of mitral valve regurgitation. No evidence of mitral stenosis.  4. The aortic valve is normal in structure. Aortic valve regurgitation is not visualized. No aortic stenosis is present.  5. The inferior vena cava is normal in size with greater than  50% respiratory variability, suggesting right atrial pressure of 3 mmHg. Conclusion(s)/Recommendation(s): No evidence of valvular vegetations on this transthoracic echocardiogram. Would recommend a transesophageal echocardiogram to exclude infective endocarditis if clinically indicated. FINDINGS  Left Ventricle: Left ventricular ejection fraction, by estimation, is 70 to 75%. The left ventricle has hyperdynamic function. The left ventricle has no regional wall motion abnormalities.  The left ventricular internal cavity size was normal in size. There is mild concentric left ventricular hypertrophy. Left ventricular diastolic parameters were normal. Right Ventricle: The right ventricular size is normal. No increase in right ventricular wall thickness. Right ventricular systolic function is normal. Left Atrium: Left atrial size was normal in size. Right Atrium: Right atrial size was normal in size. Pericardium: There is no evidence of pericardial effusion. Mitral Valve: The mitral valve is normal in structure. No evidence of mitral valve stenosis. Tricuspid Valve: The tricuspid valve is normal in structure. Tricuspid valve regurgitation is trivial. No evidence of tricuspid stenosis. Aortic Valve: The aortic valve is normal in structure. Aortic valve regurgitation is not visualized. No aortic stenosis is present. Pulmonic Valve: The pulmonic valve was normal in structure. Pulmonic valve regurgitation is not visualized. No evidence of pulmonic stenosis. Aorta: The aortic root is normal in size and structure. Venous: The inferior vena cava is normal in size with greater than 50% respiratory variability, suggesting right atrial pressure of 3 mmHg. IAS/Shunts: No atrial level shunt detected by color flow Doppler. LEFT VENTRICLE PLAX 2D LVIDd:         4.00 cm  Diastology LVIDs:         2.20 cm  LV e' medial:    11.10 cm/s LV PW:         1.30 cm  LV E/e' medial:  8.7 LV IVS:        1.20 cm  LV e' lateral:   17.70 cm/s LVOT diam:     2.20 cm  LV E/e' lateral: 5.4 LVOT Area:     3.80 cm  IVC IVC diam: 1.30 cm LEFT ATRIUM         Index LA diam:    3.50 cm 1.96 cm/m   AORTA Ao Root diam: 3.10 cm Ao Asc diam:  2.80 cm MITRAL VALVE MV Area (PHT): 3.31 cm    SHUNTS MV Decel Time: 229 msec    Systemic Diam: 2.20 cm MV E velocity: 96.40 cm/s MV A velocity: 66.60 cm/s MV E/A ratio:  1.45 Donato Schultz MD Electronically signed by Donato Schultz MD Signature Date/Time: 06/19/2021/5:10:54 PM    Final      Pamella Pert, MD, PhD [facility]  Between 7 am - 7 pm I am available, please contact me via Amion (for emergencies) or Securechat (non urgent messages)  Between 7 pm - 7 am I am not available, please contact night coverage MD/APP via Amion

## 2021-06-20 NOTE — Progress Notes (Signed)
   Providing Compassionate, Quality Care - Together  NEUROSURGERY PROGRESS NOTE   S: No issues overnight.  Complains of significant neck pain.  Some soreness with swallowing  O: EXAM:  BP 119/73 (BP Location: Right Arm)   Pulse 68   Temp 99.6 F (37.6 C) (Oral) Comment: RN Notified  Resp 19   Ht 5\' 9"  (1.753 m)   Wt 64.8 kg   SpO2 100%   BMI 21.10 kg/m   Awake, alert, oriented  Speech fluent, appropriate  CNs grossly intact  Moves all extremities equally Trachea midline Neck soft Dressing clean dry and intact Normal phonation   ASSESSMENT:  30 y.o. male with  Epidural abscess, status post C5-6 ACDF  PLAN: -PT/OT -Pain control -ABX per ID    Thank you for allowing me to participate in this patient's care.  Please do not hesitate to call with questions or concerns.   34, DO Neurosurgeon [facility] Cell: [phone]

## 2021-06-20 NOTE — Plan of Care (Signed)
O x 4. Resting most of night with eyes closed but when primary nurse enters room immediately complains about inadequate pain control. Is able to sit up, readjust himself in bed. Is constantly asking for food and drinks- and he is eating all of it. Complains of throat pain but no difficulties noted when eating.  Problem: Elimination: Goal: Will not experience complications related to urinary retention Outcome: Progressing   Problem: Safety: Goal: Ability to remain free from injury will improve Outcome: Progressing   Problem: Pain Managment: Goal: General experience of comfort will improve Outcome: Not Progressing

## 2021-06-20 NOTE — Anesthesia Postprocedure Evaluation (Signed)
Anesthesia Post Note  Patient: Gerald Oliver  Procedure(s) Performed: ANTERIOR CERVICAL DECOMPRESSION/DISCECTOMY FUSION C5-C6     Patient location during evaluation: PACU Anesthesia Type: General Level of consciousness: awake and alert Pain management: pain level controlled Vital Signs Assessment: post-procedure vital signs reviewed and stable Respiratory status: spontaneous breathing, nonlabored ventilation, respiratory function stable and patient connected to nasal cannula oxygen Cardiovascular status: blood pressure returned to baseline and stable Postop Assessment: no apparent nausea or vomiting Anesthetic complications: no   No notable events documented.  Last Vitals:  Vitals:   06/20/21 0117 06/20/21 0425  BP: 116/73 122/72  Pulse: 60   Resp: 10   Temp:  37.7 C  SpO2: 96%     Last Pain:  Vitals:   06/20/21 0452  TempSrc:   PainSc: 8                  Alejandra Barna S

## 2021-06-21 ENCOUNTER — Encounter (HOSPITAL_COMMUNITY): Payer: Self-pay | Admitting: Neurosurgery

## 2021-06-21 DIAGNOSIS — U071 COVID-19: Secondary | ICD-10-CM | POA: Diagnosis present

## 2021-06-21 LAB — BASIC METABOLIC PANEL
Anion gap: 6 (ref 5–15)
BUN: 18 mg/dL (ref 6–20)
CO2: 32 mmol/L (ref 22–32)
Calcium: 9.6 mg/dL (ref 8.9–10.3)
Chloride: 100 mmol/L (ref 98–111)
Creatinine, Ser: 0.82 mg/dL (ref 0.61–1.24)
GFR, Estimated: >60 mL/min (ref >60)
Glucose, Bld: 119 mg/dL — ABNORMAL HIGH (ref 70–99)
Potassium: 4.1 mmol/L (ref 3.5–5.1)
Sodium: 138 mmol/L (ref 135–145)

## 2021-06-21 LAB — VANCOMYCIN, PEAK: Vancomycin Pk: 60 ug/mL (ref 30–40)

## 2021-06-21 MED ORDER — PREGABALIN 25 MG PO CAPS
25.0000 mg | ORAL_CAPSULE | Freq: Three times a day (TID) | ORAL | Status: DC
  Administered 2021-06-21 (×3): 25 mg via ORAL
  Filled 2021-06-21 (×3): qty 1

## 2021-06-21 MED ORDER — SODIUM CHLORIDE 0.9 % IV SOLN
2.0000 g | Freq: Three times a day (TID) | INTRAVENOUS | Status: DC
  Administered 2021-06-21 – 2021-07-19 (×84): 2 g via INTRAVENOUS
  Filled 2021-06-21 (×86): qty 2

## 2021-06-21 MED ORDER — METAXALONE 800 MG PO TABS
400.0000 mg | ORAL_TABLET | Freq: Four times a day (QID) | ORAL | Status: DC
  Administered 2021-06-21 – 2021-06-22 (×3): 400 mg via ORAL
  Filled 2021-06-21 (×2): qty 0.5
  Filled 2021-06-21: qty 1
  Filled 2021-06-21 (×3): qty 0.5
  Filled 2021-06-21: qty 1

## 2021-06-21 NOTE — Progress Notes (Signed)
Went to give pt his Toradol and he put the [facility] all the way up and then kept saying Ow Ouch. When asked what was wrong he stated his pain was almost gone until he lifted his head up now it is a 9. I told pt then to lower the [facility] to a comfortable level.

## 2021-06-21 NOTE — Progress Notes (Signed)
[facility] PROGRESS NOTE  Gerald Oliver NTZ:[id number] DOB: 1991/08/03 DOA: 06/17/2021 PCP: Patient, No Pcp Per (Inactive)  Status: Remains inpatient appropriate because:Unsafe d/c plan, IV treatments appropriate due to intensity of illness or inability to take PO, and Inpatient level of care appropriate due to severity of illness  Dispo: The patient is from: Home              Anticipated d/c is to: Home              Patient currently is not medically stable to d/c.   Difficult to place patient No    Level of care: Telemetry Medical  Code Status: Full Family Communication: Patient only DVT prophylaxis: SCDs COVID vaccination status: Found to have incidental COVID this admission-asymptomatic-apparently was not immunized prior to admission   HPI: 30 year old male with history of IV drug use, prior heroin overdose in May 2022 comes into the hospital with upper back pain.  This is been going on for the last several days progressively getting worse.  He also reports subjective fevers.  He also smokes tobacco, marijuana and occasionally drinks EtOH.  He uses heroin regularly, last used 2 days prior to admission.  Of note, his brother is also hospitalized with IV drug use related complications.  On admission, he was noted to be incidentally COVID-positive.  MRI on admission showed an epidural abscess, neurosurgery consulted  Subjective: Alert.  Primarily complaining of sore throat and neck pain.  Objective: Vitals:   06/21/21 0843 06/21/21 1115  BP: (!) 126/56 117/65  Pulse: 72 81  Resp: 16 17  Temp: 98.6 F (37 C) 98.5 F (36.9 C)  SpO2: 96% 97%    Intake/Output Summary (Last 24 hours) at 06/21/2021 1239 Last data filed at 06/21/2021 0843 Gross per 24 hour  Intake 240 ml  Output 750 ml  Net -510 ml   Filed Weights   06/19/21 0545 06/20/21 0425 06/21/21 0416  Weight: 64.6 kg 64.8 kg 64.5 kg    Exam:  Constitutional: NAD, calm, uncomfortable secondary to ongoing  neck pain Neck: Operative dressing clean dry and intact Respiratory: clear to auscultation bilaterally, no wheezing, no crackles. Normal respiratory effort. No accessory muscle use.  Room air Cardiovascular: Regular rate and rhythm, no murmurs / rubs / gallops. No extremity edema.  Skin warm and dry with adequate capillary refill. Abdomen: no tenderness, no masses palpated. Bowel sounds positive. LBM/1 Neurologic: CN 2-12 grossly intact. Sensation intact, DTR normal. Strength 5/5 x all 4 extremities.  Psychiatric: Normal judgment and insight. Alert and oriented x 3. Normal mood.    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    -Neurosurgery consulted. 06/18/2021, status post discectomy, thecal sac decompression and epidural abscess debridement, also received intervertebral biomechanical lordotic cage as well as anterior instrumentation with interbody plate. -Pain control alternating with IV Dilaudid and p.o. oxycodone, patient in severe pain, will not change Dilaudid dose. -Oral Oxy IR at max dose 15 mg prn along with IV Toradol -9/5 we will add low-dose Lyrica 25 mg TID and Skelaxin 400 mg QID -Appreciate neurosurgical follow-up -Operative culture positive for Serratia.  ID has discontinued vancomycin and Rocephin as of 9/5 in favor of cefepime  Heroin, polysubstance abuse -Discussed pain management at length with patient.  Plan is to continue to treat acute pain as above and then  once acute pain is adequately controlled begin to taper narcotics in favor of methadone.  Patient wishes to follow-up with methadone clinic after discharge -2D echo shows an EF of 70-75%, LV has hyperdynamic function, no WMA.  RV was normal.  No evidence of valvular vegetations.   Covid positive -Asymptomatic -CRP  slightly elevated at 7.3 with an ESR 35 and this is likely related to acute bacterial infection as above -Chest x-ray negative.  Recommend 10-day isolation until 9/11, no treatment necessary at this point     Data Reviewed: Basic Metabolic Panel: Recent Labs  Lab 06/17/21 1007 06/18/21 0006 06/19/21 0315 06/21/21 0149  NA 133* 134* 133* 138  K 4.0 3.7 4.6 4.1  CL 99 102 102 100  CO2 _0 32  GLUCOSE 122* 204* 170* 119*  BUN _1 CREATININE 0.78 0.91 0.73 0.82  CALCIUM 8.9 8.4* 8.8* 9.6   Liver Function Tests: Recent Labs  Lab 06/17/21 1007  AST 13*  ALT 9  ALKPHOS 83  BILITOT 0.8  PROT 7.1  ALBUMIN 3.1*   No results for input(s): LIPASE, AMYLASE in the last 168 hours. No results for input(s): AMMONIA in the last 168 hours. CBC: Recent Labs  Lab 06/17/21 1007 06/18/21 0006  WBC 12.8* 12.0*  NEUTROABS 9.7*  --   HGB 11.9* 11.7*  HCT 36.7* 35.3*  MCV 82.3 80.4  PLT 104* 116*   Cardiac Enzymes: No results for input(s): CKTOTAL, CKMB, CKMBINDEX, TROPONINI in the last 168 hours. BNP (last 3 results) No results for input(s): BNP in the last 8760 hours.  ProBNP (last 3 results) No results for input(s): PROBNP in the last 8760 hours.  CBG: No results for input(s): GLUCAP in the last 168 hours.  Recent Results (from the past 240 hour(s))  Culture, blood (routine x 2)     Status: None (Preliminary result)   Collection Time: 06/17/21  2:03 PM   Specimen: BLOOD LEFT HAND  Result Value Ref Range Status   Specimen Description BLOOD LEFT HAND  Final   Special Requests   Final    BOTTLES DRAWN AEROBIC AND ANAEROBIC Blood Culture adequate volume   Culture   Final    NO GROWTH 4 DAYS Performed at [facility], [address], [place], [place] [address]    Report Status PENDING  Incomplete  Resp Panel by RT-PCR (Flu A&B, Covid) Nasopharyngeal Swab     Status: Abnormal   Collection Time: 06/17/21  6:24 PM   Specimen: Nasopharyngeal Swab;  Nasopharyngeal(NP) swabs in vial transport medium  Result Value Ref Range Status   SARS Coronavirus 2 by RT PCR POSITIVE (A) NEGATIVE Final    Comment: RESULT CALLED TO, READ BACK BY AND VERIFIED WITH: GRACE TATE RN 06/17/21 _2  BY JW (NOTE) SARS-CoV-2 target nucleic acids are DETECTED.  The SARS-CoV-2 RNA is generally detectable in upper respiratory specimens during the acute phase of infection. Positive results are indicative of the presence of the identified virus, but do not rule out bacterial infection or co-infection with other pathogens not detected by the test. Clinical correlation with patient history and other diagnostic information is necessary to determine patient infection status. The expected result is Negative.  Fact Sheet for Patients: EntrepreneurPulse.com.au  Fact Sheet for Healthcare Providers: IncredibleEmployment.be  This test is not yet approved or cleared by the [place] FDA and  has been authorized for detection and/or diagnosis of SARS-CoV-2 by FDA under an Emergency Use Authorization (EUA).  This EUA will remain in effect (meaning this test can be  used) for the duration of  the COVID-19 declaration under Section 564(b)(1) of the Act, 21 U.S.C. section 360bbb-3(b)(1), unless the authorization is terminated or revoked sooner.     Influenza A by PCR NEGATIVE NEGATIVE Final   Influenza B by PCR NEGATIVE NEGATIVE Final    Comment: (NOTE) The Xpert Xpress SARS-CoV-2/FLU/RSV plus assay is intended as an aid in the diagnosis of influenza from Nasopharyngeal swab specimens and should not be used as a sole basis for treatment. Nasal washings and aspirates are unacceptable for Xpert Xpress SARS-CoV-2/FLU/RSV testing.  Fact Sheet for Patients: EntrepreneurPulse.com.au  Fact Sheet for Healthcare Providers: IncredibleEmployment.be  This test is not yet approved or cleared by the  [place] FDA and has been authorized for detection and/or diagnosis of SARS-CoV-2 by FDA under an Emergency Use Authorization (EUA). This EUA will remain in effect (meaning this test can be used) for the duration of the COVID-19 declaration under Section 564(b)(1) of the Act, 21 U.S.C. section 360bbb-3(b)(1), unless the authorization is terminated or revoked.  Performed at [facility], [address] [place], [place] [address]   Culture, blood (Routine X 2) w Reflex to ID Panel     Status: None (Preliminary result)   Collection Time: 06/18/21 12:06 AM   Specimen: BLOOD  Result Value Ref Range Status   Specimen Description BLOOD SITE NOT SPECIFIED  Final   Special Requests   Final    BOTTLES DRAWN AEROBIC AND ANAEROBIC Blood Culture adequate volume   Culture   Final    NO GROWTH 3 DAYS Performed at [facility], [address] [place], [place] [address]    Report Status PENDING  Incomplete  Culture, blood (routine x 2)     Status: None (Preliminary result)   Collection Time: 06/18/21  9:26 AM   Specimen: BLOOD RIGHT HAND  Result Value Ref Range Status   Specimen Description BLOOD RIGHT HAND  Final   Special Requests   Final    BOTTLES DRAWN AEROBIC AND ANAEROBIC Blood Culture adequate volume   Culture   Final    NO GROWTH 3 DAYS Performed at [facility], [address], [place], [place] [address]    Report Status PENDING  Incomplete  Culture, blood (routine x 2)     Status: None (Preliminary result)   Collection Time: 06/18/21  9:30 AM   Specimen: BLOOD RIGHT WRIST  Result Value Ref Range Status   Specimen Description BLOOD RIGHT WRIST  Final   Special Requests   Final    BOTTLES DRAWN AEROBIC ONLY Blood Culture results may not be optimal due to an inadequate volume of blood received in culture bottles   Culture   Final    NO GROWTH 3 DAYS Performed at [facility], [place] [address] [place], [place] [address]    Report Status PENDING   Incomplete  Aerobic/Anaerobic Culture w Gram Stain (surgical/deep wound)     Status: None (Preliminary result)   Collection Time: 06/18/21  5:48 PM   Specimen: Abscess; Wound  Result Value Ref Range Status   Specimen Description ABSCESS  Final   Special Requests EPIDURAL  Final   Gram Stain   Final    FEW WBC PRESENT,BOTH PMN AND MONONUCLEAR NO ORGANISMS SEEN Performed at [facility], [address] [place], [place] [address]    Culture   Final    FEW  SERRATIA MARCESCENS SUSCEPTIBILITIES PERFORMED ON PREVIOUS CULTURE WITHIN THE LAST 5 DAYS. NO ANAEROBES ISOLATED; CULTURE IN PROGRESS FOR 5 DAYS    Report Status PENDING  Incomplete  Aerobic/Anaerobic Culture w Gram Stain (surgical/deep wound)     Status: None (Preliminary result)   Collection Time: 06/18/21  5:53 PM   Specimen: Tissue  Result Value Ref Range Status   Specimen Description TISSUE  Final   Special Requests EPIDURAL  Final   Gram Stain   Final    MODERATE WBC PRESENT,BOTH PMN AND MONONUCLEAR RARE GRAM NEGATIVE RODS Performed at [facility], [address], [place], [place] [address]    Culture   Final    ABUNDANT SERRATIA MARCESCENS NO ANAEROBES ISOLATED; CULTURE IN PROGRESS FOR 5 DAYS    Report Status PENDING  Incomplete   Organism ID, Bacteria SERRATIA MARCESCENS  Final      Susceptibility   Serratia marcescens - MIC*    CEFAZOLIN >=64 RESISTANT Resistant     CEFEPIME <=0.12 SENSITIVE Sensitive     CEFTAZIDIME <=1 SENSITIVE Sensitive     CEFTRIAXONE <=0.25 SENSITIVE Sensitive     CIPROFLOXACIN <=0.25 SENSITIVE Sensitive     GENTAMICIN <=1 SENSITIVE Sensitive     TRIMETH/SULFA <=20 SENSITIVE Sensitive     * ABUNDANT SERRATIA MARCESCENS     Studies: ECHOCARDIOGRAM LIMITED  Result Date: 06/19/2021    ECHOCARDIOGRAM LIMITED REPORT   Patient Name:   Gerald Oliver Date of Exam: 06/19/2021 Medical Rec #:  [id number]        Height:       69.0 in Accession #:    [id number]       Weight:       142.4  lb Date of Birth:  Sep 04, 1991        BSA:          1.788 m Patient Age:    30 years         BP:           127/71 mmHg Patient Gender: M                HR:           75 bpm. Exam Location:  Inpatient Procedure: 2D Echo, Cardiac Doppler and Color Doppler Indications:    Endocardititis  History:        Patient has prior history of Echocardiogram examinations, most                 recent 05/07/2018. Polysubstance abuse. Sepsis. COVID-19.  Sonographer:    Clayton Lefort RDCS (AE) Referring Phys: [id number] [place]  1. Left ventricular ejection fraction, by estimation, is 70 to 75%. The left ventricle has hyperdynamic function. The left ventricle has no regional wall motion abnormalities. There is mild concentric left ventricular hypertrophy. Left ventricular diastolic parameters were normal.  2. Right ventricular systolic function is normal. The right ventricular size is normal.  3. The mitral valve is normal in structure. No evidence of mitral valve regurgitation. No evidence of mitral stenosis.  4. The aortic valve is normal in structure. Aortic valve regurgitation is not visualized. No aortic stenosis is present.  5. The inferior vena cava is normal in size with greater than 50% respiratory variability, suggesting right atrial pressure of 3 mmHg. Conclusion(s)/Recommendation(s): No evidence of valvular vegetations on this transthoracic echocardiogram. Would recommend a transesophageal echocardiogram to exclude infective endocarditis if clinically indicated. FINDINGS  Left Ventricle: Left ventricular ejection fraction, by estimation,  is 70 to 75%. The left ventricle has hyperdynamic function. The left ventricle has no regional wall motion abnormalities. The left ventricular internal cavity size was normal in size. There is mild concentric left ventricular hypertrophy. Left ventricular diastolic parameters were normal. Right Ventricle: The right ventricular size is normal. No increase in right ventricular  wall thickness. Right ventricular systolic function is normal. Left Atrium: Left atrial size was normal in size. Right Atrium: Right atrial size was normal in size. Pericardium: There is no evidence of pericardial effusion. Mitral Valve: The mitral valve is normal in structure. No evidence of mitral valve stenosis. Tricuspid Valve: The tricuspid valve is normal in structure. Tricuspid valve regurgitation is trivial. No evidence of tricuspid stenosis. Aortic Valve: The aortic valve is normal in structure. Aortic valve regurgitation is not visualized. No aortic stenosis is present. Pulmonic Valve: The pulmonic valve was normal in structure. Pulmonic valve regurgitation is not visualized. No evidence of pulmonic stenosis. Aorta: The aortic root is normal in size and structure. Venous: The inferior vena cava is normal in size with greater than 50% respiratory variability, suggesting right atrial pressure of 3 mmHg. IAS/Shunts: No atrial level shunt detected by color flow Doppler. LEFT VENTRICLE PLAX 2D LVIDd:         4.00 cm  Diastology LVIDs:         2.20 cm  LV e' medial:    11.10 cm/s LV PW:         1.30 cm  LV E/e' medial:  8.7 LV IVS:        1.20 cm  LV e' lateral:   17.70 cm/s LVOT diam:     2.20 cm  LV E/e' lateral: 5.4 LVOT Area:     3.80 cm  IVC IVC diam: 1.30 cm LEFT ATRIUM         Index LA diam:    3.50 cm 1.96 cm/m   AORTA Ao Root diam: 3.10 cm Ao Asc diam:  2.80 cm MITRAL VALVE MV Area (PHT): 3.31 cm    SHUNTS MV Decel Time: 229 msec    Systemic Diam: 2.20 cm MV E velocity: 96.40 cm/s MV A velocity: 66.60 cm/s MV E/A ratio:  1.45 Candee Furbish MD Electronically signed by Candee Furbish MD Signature Date/Time: 06/19/2021/5:10:54 PM    Final     Scheduled Meds:  docusate sodium  100 mg Oral BID   feeding supplement  237 mL Oral BID BM   ketorolac  15 mg Intravenous Q8H   metaxalone  400 mg Oral QID   multivitamin with minerals  1 tablet Oral Daily   pregabalin  25 mg Oral TID   sodium chloride flush  3  mL Intravenous Q12H   Continuous Infusions:  ceFEPime (MAXIPIME) IV 2 g (06/21/21 0829)    Principal Problem:   Epidural abscess Active Problems:   Heroin abuse ([place])   Homeless   COVID-19 virus infection   Consultants: Neurosurgery Infectious disease  Procedures: 2D echocardiogram Cervical discectomy with debridement of epidural abscess and placement of intervertebral lordotic cage  Antibiotics: Meropenem x1 dose 9/1 Vancomycin IV 9/1 through 9/4 Ceftriaxone 9/2 through 9/4 Cefepime 9/5 >>   Time spent: 35 minutes    Erin Hearing ANP  [facility] 7 am - 330 pm/M-F for direct patient care and secure chat Please refer to Amion for contact info 4  days

## 2021-06-21 NOTE — Progress Notes (Signed)
Pharmacy Antibiotic Note  Gerald Oliver is a 30 y.o. male admitted on 06/17/2021 with  epidural abscess .  Pharmacy has been consulted to change ABX from vanc + ceftriaxone to cefepime for Serratia marcescens growing on surgical culture.  Plan: Cefepime 2g IV Q8H.  Height: 5\' 9"  (175.3 cm) Weight: 64.5 kg (142 lb 3.2 oz) IBW/kg (Calculated) : 70.7  Temp (24hrs), Avg:99.1 F (37.3 C), Min:98.6 F (37 C), Max:99.6 F (37.6 C)  Recent Labs  Lab 06/17/21 1007 06/17/21 1009 06/17/21 1041 06/18/21 0006 06/19/21 0315 06/21/21 0149  WBC 12.8*  --   --  12.0*  --   --   CREATININE 0.78  --   --  0.91 0.73 0.82  LATICACIDVEN  --  0.9 0.9  --   --   --   VANCOPEAK  --   --   --   --   --  60*    Estimated Creatinine Clearance: 118 mL/min (by C-G formula based on SCr of 0.82 mg/dL).    No Known Allergies   Thank you for allowing pharmacy to be a part of this patient's care.  08/21/21, PharmD, BCPS  06/21/2021 6:52 AM

## 2021-06-21 NOTE — Progress Notes (Addendum)
ID Brief Note  9/2 OR cultures growing Serratia marcescens TTE with no vegetations or endocarditis  Plan DC vancomycin and ceftriaxone and start cefepime ( orders placed to pharmacy) Monitor fever curve, CBC, BMP and cultures  Odette Fraction, MD Infectious Disease Physician [facility] [address] [place], [place] [address] Phone: [phone]  Fax: [phone]

## 2021-06-22 LAB — CULTURE, BLOOD (ROUTINE X 2)
Culture: NO GROWTH
Special Requests: ADEQUATE

## 2021-06-22 MED ORDER — PREGABALIN 75 MG PO CAPS
75.0000 mg | ORAL_CAPSULE | Freq: Three times a day (TID) | ORAL | Status: DC
  Administered 2021-06-22 – 2021-07-03 (×35): 75 mg via ORAL
  Filled 2021-06-22 (×35): qty 1

## 2021-06-22 MED ORDER — OXYCODONE HCL 5 MG/5ML PO SOLN
15.0000 mg | ORAL | Status: DC | PRN
  Administered 2021-06-22 – 2021-06-23 (×4): 15 mg via ORAL
  Filled 2021-06-22 (×4): qty 15

## 2021-06-22 MED ORDER — ADULT MULTIVITAMIN LIQUID CH
15.0000 mL | Freq: Every day | ORAL | Status: DC
  Administered 2021-06-22 – 2021-06-24 (×3): 15 mL via ORAL
  Filled 2021-06-22 (×4): qty 15

## 2021-06-22 MED ORDER — BACLOFEN 1 MG/ML ORAL SUSPENSION
5.0000 mg | Freq: Four times a day (QID) | ORAL | Status: DC
  Administered 2021-06-22 – 2021-06-23 (×7): 5 mg via ORAL
  Filled 2021-06-22 (×10): qty 5

## 2021-06-22 MED ORDER — DOCUSATE SODIUM 50 MG/5ML PO LIQD
100.0000 mg | Freq: Two times a day (BID) | ORAL | Status: DC
  Administered 2021-06-22 – 2021-06-23 (×2): 100 mg via ORAL
  Filled 2021-06-22 (×4): qty 10

## 2021-06-22 NOTE — Progress Notes (Signed)
Occupational Therapy Evaluation Patient Details Name: Gerald Oliver MRN: [id number] DOB: 12/17/1990 Today's Date: 06/22/2021    History of Present Illness 30 yo male presents [facility] on 9/1 with neck pain x2 days. MRI suggestive of epidural abscess. s/p ACDF C5-6 on 9/2. 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   Clinical Impression   Pt was evaluated s/p the above cervical sx. PTA pt was indep in all ADLs and mobility and was homeless, reports sleeping in a sleeping bag on the street. Upon evaluation pt was mod I with all ADLs and in-room functional ambulation with great adherence to cervical precautions. Pt does not required further acute OT services. Recommend d/c to shelter or rehab for IVDU if pt agreeable, otherwise safe to d/c to streets.    Follow Up Recommendations  No OT follow up    Equipment Recommendations  None recommended by OT       Precautions / Restrictions Precautions Precautions: Fall;Cervical Precaution Comments: reviewed cervical precautions, pt with no orders for brace at rest or during mobility. Pt recalled 3 cervical precautions. Restrictions Weight Bearing Restrictions: No      Mobility Bed Mobility Overal bed mobility: Needs Assistance Bed Mobility: Sit to Supine     Supine to sit: HOB elevated;Modified independent (Device/Increase time)     General bed mobility comments: demonstrated great cervical precautions    Transfers Overall transfer level: Needs assistance Equipment used: None Transfers: Sit to/from Stand Sit to Stand: Min guard         General transfer comment: for safety, slow to rise with reports of dizziness upon standing. Static standing x10 seconds.    Balance Overall balance assessment: Needs assistance Sitting-balance support: No upper extremity supported;Feet supported Sitting balance-Leahy Scale: Good     Standing balance support: No upper extremity supported;During  functional activity Standing balance-Leahy Scale: Fair                             ADL either performed or assessed with clinical judgement   ADL Overall ADL's : Modified independent               General ADL Comments: Pt completed a full ADL session with bathing, dressing and grooming at a mod I level. Pt verbalized and demonstrated great understanding of cervial precautions during ADLs. He also completed functional room distacne ambulation with mod I for incrased time, no AD/DME. Pt is able to ontain a fogure four position for lower body dressing, no AE needed.     Vision Baseline Vision/History: 0 No visual deficits              Pertinent Vitals/Pain Pain Assessment: Faces Pain Score: 8  Faces Pain Scale: Hurts a little bit Pain Location: neck Pain Descriptors / Indicators: Sore;Aching;Burning Pain Intervention(s): Limited activity within patient's tolerance;Repositioned     Hand Dominance Right   Extremity/Trunk Assessment Upper Extremity Assessment Upper Extremity Assessment: Defer to OT evaluation   Lower Extremity Assessment Lower Extremity Assessment: Defer to PT evaluation   Cervical / Trunk Assessment Cervical / Trunk Assessment: Other exceptions Cervical / Trunk Exceptions: s/p acdf   Communication Communication Communication: No difficulties   Cognition Arousal/Alertness: Awake/alert Behavior During Therapy: Anxious;Restless Overall Cognitive Status: Within Functional Limits for tasks assessed  General Comments: Pt irritable, requires max encouragement. Limited insight into deficits and safety   General Comments  RN in room at the end of the session, aware that ECG and pulse ox were doffed. Supplies given to place pt back on. No other needs. VSS throughout session    Exercises     Shoulder Instructions      Home Living Family/patient expects to be discharged to:: Shelter/Homeless                  Additional Comments: pt's brother is admitted      Prior Functioning/Environment Level of Independence: Independent        Comments: pt states he sleeps in a sleeping bag, is homeless. Pt states he plans to go to OP rehab.        OT Problem List: Decreased activity tolerance;Pain      OT Treatment/Interventions:      OT Goals(Current goals can be found in the care plan section) Acute Rehab OT Goals Patient Stated Goal: go to OP rehab, keep working  OT Frequency:     Barriers to D/C: Inaccessible home environment  pt is shomeless       Co-evaluation              AM-PAC OT "6 Clicks" Daily Activity     Outcome Measure Help from another person eating meals?: None Help from another person taking care of personal grooming?: None Help from another person toileting, which includes using toliet, bedpan, or urinal?: None Help from another person bathing (including washing, rinsing, drying)?: None Help from another person to put on and taking off regular upper body clothing?: None Help from another person to put on and taking off regular lower body clothing?: None 6 Click Score: 24   End of Session Nurse Communication: Mobility status;Precautions;Weight bearing status  Activity Tolerance: Patient tolerated treatment well Patient left: in bed;with call bell/phone within reach;with nursing/sitter in room  OT Visit Diagnosis: Unsteadiness on feet (R26.81);Pain                Time: 1047-1130 OT Time Calculation (min): 43 min Charges:  OT General Charges $OT Visit: 1 Visit OT Evaluation $OT Eval Low Complexity: 1 Low OT Treatments $Self Care/Home Management : 23-37 mins    Helane Briceno A Jalise Zawistowski 06/22/2021, 1:01 PM

## 2021-06-22 NOTE — Evaluation (Signed)
Physical Therapy Evaluation Patient Details Name: Gerald Oliver MRN: [id number] DOB: 1990/11/15 Today's Date: 06/22/2021   History of Present Illness  30 yo male presents to [facility] on 9/1 with neck pain x2 days. MRI suggestive of epidural abscess. s/p ACDF C5-6 on 9/2. 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   Clinical Impression  Pt presents with impaired strength, neck pain, increased time and effort to perform mobility tasks, impaired knowledge and application of cervical precautions post-operatively, and decreased activity tolerance vs baseline. Pt to benefit from acute PT to address deficits. Pt ambulated room distance with close guard for safety, no physical assist required on eval but very limited mobility secondary to pt pain and resistance to mobility. Up in bathroom with OT at end of PT session. PT to progress mobility as tolerated, and will continue to follow acutely.      Follow Up Recommendations Follow surgeon's recommendation for DC plan and follow-up therapies;Supervision for mobility/OOB    Equipment Recommendations  Other (comment) (tbd)    Recommendations for Other Services       Precautions / Restrictions Precautions Precautions: Fall;Cervical Precaution Comments: reviewed cervical precautions, pt with no orders for brace at rest or during mobility Restrictions Weight Bearing Restrictions: No      Mobility  Bed Mobility Overal bed mobility: Needs Assistance Bed Mobility: Supine to Sit     Supine to sit: Supervision;HOB elevated     General bed mobility comments: supervision for safety, pt kept head and neck still and used bedrails to elevate off of bed.    Transfers Overall transfer level: Needs assistance Equipment used: None Transfers: Sit to/from Stand Sit to Stand: Min guard         General transfer comment: for safety, slow to rise with reports of dizziness upon standing. Static standing x10  seconds.  Ambulation/Gait Ambulation/Gait assistance: Min guard Gait Distance (Feet): 10 Feet Assistive device: None Gait Pattern/deviations: Step-through pattern;Decreased stride length;Trunk flexed Gait velocity: decr   General Gait Details: close guard for safety, verbal cuing for room navigation, avoiding obstacles. Slowed gait with forward flexed trunk secondary to pain.  Stairs            Wheelchair Mobility    Modified Rankin (Stroke Patients Only)       Balance Overall balance assessment: Needs assistance Sitting-balance support: No upper extremity supported;Feet supported Sitting balance-Leahy Scale: Good     Standing balance support: No upper extremity supported;During functional activity Standing balance-Leahy Scale: Fair                               Pertinent Vitals/Pain Pain Assessment: 0-10 Pain Score: 8  Pain Location: neck Pain Descriptors / Indicators: Sore;Aching;Burning Pain Intervention(s): Limited activity within patient's tolerance;Monitored during session;Patient requesting pain meds-RN notified    Home Living Family/patient expects to be discharged to:: Shelter/Homeless                 Additional Comments: pt's brother is admitted    Prior Function Level of Independence: Independent         Comments: pt states he sleeps in a sleeping bag, is homeless. Pt states he plans to go to OP rehab.     Hand Dominance   Dominant Hand: Right    Extremity/Trunk Assessment   Upper Extremity Assessment Upper Extremity Assessment: Defer to OT evaluation    Lower Extremity Assessment Lower Extremity Assessment: Generalized  weakness (limited exam due to limited pt participation; pt able to perform DF/PF, heel slides)    Cervical / Trunk Assessment Cervical / Trunk Assessment: Other exceptions Cervical / Trunk Exceptions: s/p acdf  Communication   Communication: No difficulties  Cognition Arousal/Alertness:  Awake/alert Behavior During Therapy: Anxious;Restless Overall Cognitive Status: Within Functional Limits for tasks assessed                                 General Comments: Pt iritable, does not want to mobilize and requires max cuing to participate.      General Comments General comments (skin integrity, edema, etc.): pt requested doffing ECG and pulse ox during session, due to them annoying pt and wanting to wash up    Exercises     Assessment/Plan    PT Assessment Patient needs continued PT services  PT Problem List Decreased strength;Decreased mobility;Decreased safety awareness;Decreased activity tolerance;Decreased knowledge of use of DME;Decreased balance;Pain;Decreased knowledge of precautions       PT Treatment Interventions DME instruction;Therapeutic activities;Gait training;Therapeutic exercise;Patient/family education;Balance training;Stair training;Functional mobility training;Neuromuscular re-education    PT Goals (Current goals can be found in the Care Plan section)  Acute Rehab PT Goals Patient Stated Goal: go to OP rehab, keep working PT Goal Formulation: With patient Time For Goal Achievement: 07/06/21 Potential to Achieve Goals: Good    Frequency Min 5X/week   Barriers to discharge        Co-evaluation               AM-PAC PT "6 Clicks" Mobility  Outcome Measure Help needed turning from your back to your side while in a flat bed without using bedrails?: A Little Help needed moving from lying on your back to sitting on the side of a flat bed without using bedrails?: A Little Help needed moving to and from a bed to a chair (including a wheelchair)?: A Little Help needed standing up from a chair using your arms (e.g., wheelchair or bedside chair)?: A Little Help needed to walk in hospital room?: A Little Help needed climbing 3-5 steps with a railing? : A Little 6 Click Score: 18    End of Session   Activity Tolerance: Patient  limited by pain;Patient limited by fatigue Patient left: Other (comment);with call bell/phone within reach;with nursing/sitter in room (up in bathroom, OT in room) Nurse Communication: Mobility status;Patient requests pain meds (let NT know - performed full linen change, pt needs supervision for mobility, pt up in bathroom washing up with OT) PT Visit Diagnosis: Other abnormalities of gait and mobility (R26.89);Pain Pain - Right/Left:  (mid) Pain - part of body:  (neck, posteriorly)    Time: 7867-6720 PT Time Calculation (min) (ACUTE ONLY): 26 min   Charges:   PT Evaluation $PT Eval Low Complexity: 1 Low PT Treatments $Therapeutic Activity: 8-22 mins       Marye Round, PT DPT Acute Rehabilitation Services Pager [phone]  Office [phone]   Zared Knoth E Christain [place] 06/22/2021, 11:21 AM

## 2021-06-22 NOTE — Progress Notes (Signed)
[facility] PROGRESS NOTE  TERRENCE WISHON YOV:[id number] DOB: 09/06/1991 DOA: 06/17/2021 PCP: Patient, No Pcp Per (Inactive)  Status: Remains inpatient appropriate because:Unsafe d/c plan, IV treatments appropriate due to intensity of illness or inability to take PO, and Inpatient level of care appropriate due to severity of illness  Dispo: The patient is from: Home              Anticipated d/c is to: Home              Patient currently is not medically stable to d/c.   Difficult to place patient No    Level of care: Telemetry Medical  Code Status: Full Family Communication: Patient only DVT prophylaxis: SCDs COVID vaccination status: Found to have incidental COVID this admission-asymptomatic-apparently was not immunized prior to admission   HPI: 30 year old male with history of IV drug use, prior heroin overdose in May 2022 comes into the hospital with upper back pain.  This is been going on for the last several days progressively getting worse.  He also reports subjective fevers.  He also smokes tobacco, marijuana and occasionally drinks EtOH.  He uses heroin regularly, last used 2 days prior to admission.  Of note, his brother is also hospitalized with IV drug use related complications.  On admission, he was noted to be incidentally COVID-positive.  MRI on admission showed an epidural abscess, neurosurgery consulted  Subjective: Awake.  Patient is mildly diaphoretic and says he is hurting and is wondering when his next pain medications are due.  He also is stating that the pills are difficult to swallow.  Gave him the option of changing what ever medications I could to liquid form and he agreed.  Objective: Vitals:   06/21/21 2339 06/22/21 0348  BP: 118/64   Pulse: 67   Resp: 16   Temp: 98.6 F (37 C) 98.8 F (37.1 C)  SpO2: 99%     Intake/Output Summary (Last 24 hours) at 06/22/2021 0747 Last data filed at 06/22/2021 0348 Gross per 24 hour  Intake 480 ml  Output  2800 ml  Net -2320 ml   Filed Weights   06/19/21 0545 06/20/21 0425 06/21/21 0416  Weight: 64.6 kg 64.8 kg 64.5 kg    Exam:  Constitutional: NAD, calm, uncomfortable secondary to ongoing neck pain as well as for swallowing Neck: Operative dressing clean dry and intact Respiratory: Able on room air, lungs are clear to auscultation without any wheezing, no increased work of breathing while supine in bed Cardiovascular: S1-S2, regular pulse, normotensive, extremities warm to touch with normal capillary refill Abdomen: no tenderness,  Bowel sounds positive.  Difficulty swallowing impacting oral intake.  LBM 9/1 Neurologic: CN 2-12 grossly intact. Sensation intact, Strength 5/5 x all 4 extremities.  Psychiatric: Normal judgment and insight. Alert and oriented x 3. Normal mood.    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    -Neurosurgery consulted. 06/18/2021, status post discectomy, thecal sac decompression and epidural abscess debridement, also received intervertebral biomechanical lordotic cage as well as anterior instrumentation with interbody plate. -ID following and recommending continue cefepime for 6 weeks beginning at 06/18/2021; ID also recommends CBC and BMET weekly with ESR and CRP every 2-week -Pain control alternating with IV Dilaudid and p.o. oxycodone, patient in severe pain, will not change Dilaudid dose. -Change Oxy IR to elixir form  with same dosage of 15 mg every 4 hours prn with IV Toradol prn -9/6 increase Lyrica to 75 mg TID -9/6 discontinue Skelaxin in favor of baclofen which is available in elixir form -Operative culture positive for Serratia.  ID has discontinued vancomycin and Rocephin as of 9/5 in favor of cefepime  Heroin, polysubstance abuse -Discussed pain  management at length with patient.  Plan is to continue to treat acute pain as above and then once acute pain is adequately controlled begin to taper narcotics in favor of methadone.  Patient wishes to follow-up with methadone clinic after discharge -2D echo shows an EF of 70-75%, LV has hyperdynamic function, no WMA.  RV was normal.  No evidence of valvular vegetations.   Covid positive -Asymptomatic -CRP slightly elevated at 7.3 with an ESR 35 and this is likely related to acute bacterial infection as above -Chest x-ray negative.  Recommend 10-day isolation until 9/11, no treatment necessary at this point  Odynophagia -Continue Cepacol spray -Change all oral medications as appropriate to elixir form     Data Reviewed: Basic Metabolic Panel: Recent Labs  Lab 06/17/21 1007 06/18/21 0006 06/19/21 0315 06/21/21 0149  NA 133* 134* 133* 138  K 4.0 3.7 4.6 4.1  CL 99 102 102 100  CO2 '26 23 25 ' 32  GLUCOSE 122* 204* 170* 119*  BUN '12 14 13 18  ' CREATININE 0.78 0.91 0.73 0.82  CALCIUM 8.9 8.4* 8.8* 9.6   Liver Function Tests: Recent Labs  Lab 06/17/21 1007  AST 13*  ALT 9  ALKPHOS 83  BILITOT 0.8  PROT 7.1  ALBUMIN 3.1*   No results for input(s): LIPASE, AMYLASE in the last 168 hours. No results for input(s): AMMONIA in the last 168 hours. CBC: Recent Labs  Lab 06/17/21 1007 06/18/21 0006  WBC 12.8* 12.0*  NEUTROABS 9.7*  --   HGB 11.9* 11.7*  HCT 36.7* 35.3*  MCV 82.3 80.4  PLT 104* 116*   Cardiac Enzymes: No results for input(s): CKTOTAL, CKMB, CKMBINDEX, TROPONINI in the last 168 hours. BNP (last 3 results) No results for input(s): BNP in the last 8760 hours.  ProBNP (last 3 results) No results for input(s): PROBNP in the last 8760 hours.  CBG: No results for input(s): GLUCAP in the last 168 hours.  Recent Results (from the past 240 hour(s))  Culture, blood (routine x 2)     Status: None   Collection Time: 06/17/21  2:03 PM   Specimen: BLOOD LEFT HAND   Result Value Ref Range Status   Specimen Description BLOOD LEFT HAND  Final   Special Requests   Final    BOTTLES DRAWN AEROBIC AND ANAEROBIC Blood Culture adequate volume   Culture   Final    NO GROWTH 5 DAYS Performed at [facility], [address], [place], [place] [address]    Report Status 06/22/2021 FINAL  Final  Resp Panel by RT-PCR (Flu A&B, Covid) Nasopharyngeal Swab     Status: Abnormal   Collection Time: 06/17/21  6:24 PM   Specimen: Nasopharyngeal Swab; Nasopharyngeal(NP) swabs in vial transport medium  Result Value Ref Range Status   SARS Coronavirus 2 by RT PCR POSITIVE (A) NEGATIVE Final    Comment: RESULT CALLED TO, READ BACK BY AND VERIFIED WITH: GRACE TATE RN 06/17/21 '@2032'  BY JW (NOTE) SARS-CoV-2 target nucleic acids are DETECTED.  The SARS-CoV-2 RNA is generally detectable in upper respiratory specimens during the acute phase of infection. Positive results are indicative of the  presence of the identified virus, but do not rule out bacterial infection or co-infection with other pathogens not detected by the test. Clinical correlation with patient history and other diagnostic information is necessary to determine patient infection status. The expected result is Negative.  Fact Sheet for Patients: EntrepreneurPulse.com.au  Fact Sheet for Healthcare Providers: IncredibleEmployment.be  This test is not yet approved or cleared by the [place] FDA and  has been authorized for detection and/or diagnosis of SARS-CoV-2 by FDA under an Emergency Use Authorization (EUA).  This EUA will remain in effect (meaning this test can be  used) for the duration of  the COVID-19 declaration under Section 564(b)(1) of the Act, 21 U.S.C. section 360bbb-3(b)(1), unless the authorization is terminated or revoked sooner.     Influenza A by PCR NEGATIVE NEGATIVE Final   Influenza B by PCR NEGATIVE NEGATIVE Final    Comment: (NOTE) The  Xpert Xpress SARS-CoV-2/FLU/RSV plus assay is intended as an aid in the diagnosis of influenza from Nasopharyngeal swab specimens and should not be used as a sole basis for treatment. Nasal washings and aspirates are unacceptable for Xpert Xpress SARS-CoV-2/FLU/RSV testing.  Fact Sheet for Patients: EntrepreneurPulse.com.au  Fact Sheet for Healthcare Providers: IncredibleEmployment.be  This test is not yet approved or cleared by the [place] FDA and has been authorized for detection and/or diagnosis of SARS-CoV-2 by FDA under an Emergency Use Authorization (EUA). This EUA will remain in effect (meaning this test can be used) for the duration of the COVID-19 declaration under Section 564(b)(1) of the Act, 21 U.S.C. section 360bbb-3(b)(1), unless the authorization is terminated or revoked.  Performed at [facility], [place] [address] [place], [place] [address]   Culture, blood (Routine X 2) w Reflex to ID Panel     Status: None (Preliminary result)   Collection Time: 06/18/21 12:06 AM   Specimen: BLOOD  Result Value Ref Range Status   Specimen Description BLOOD SITE NOT SPECIFIED  Final   Special Requests   Final    BOTTLES DRAWN AEROBIC AND ANAEROBIC Blood Culture adequate volume   Culture   Final    NO GROWTH 4 DAYS Performed at [facility], [address], [place], [place] [address]    Report Status PENDING  Incomplete  Culture, blood (routine x 2)     Status: None (Preliminary result)   Collection Time: 06/18/21  9:26 AM   Specimen: BLOOD RIGHT HAND  Result Value Ref Range Status   Specimen Description BLOOD RIGHT HAND  Final   Special Requests   Final    BOTTLES DRAWN AEROBIC AND ANAEROBIC Blood Culture adequate volume   Culture   Final    NO GROWTH 4 DAYS Performed at [facility], [place] [address], [place], [place] [address]    Report Status PENDING  Incomplete  Culture, blood (routine x 2)     Status: None  (Preliminary result)   Collection Time: 06/18/21  9:30 AM   Specimen: BLOOD RIGHT WRIST  Result Value Ref Range Status   Specimen Description BLOOD RIGHT WRIST  Final   Special Requests   Final    BOTTLES DRAWN AEROBIC ONLY Blood Culture results may not be optimal due to an inadequate volume of blood received in culture bottles   Culture   Final    NO GROWTH 4 DAYS Performed at [facility], [place] [address], [place],  [address]    Report Status PENDING  Incomplete  Aerobic/Anaerobic  Culture w Gram Stain (surgical/deep wound)     Status: None (Preliminary result)   Collection Time: 06/18/21  5:48 PM   Specimen: Abscess; Wound  Result Value Ref Range Status   Specimen Description ABSCESS  Final   Special Requests EPIDURAL  Final   Gram Stain   Final    FEW WBC PRESENT,BOTH PMN AND MONONUCLEAR NO ORGANISMS SEEN Performed at [facility], [address], [place], [place] [address]    Culture   Final    FEW SERRATIA MARCESCENS SUSCEPTIBILITIES PERFORMED ON PREVIOUS CULTURE WITHIN THE LAST 5 DAYS. NO ANAEROBES ISOLATED; CULTURE IN PROGRESS FOR 5 DAYS    Report Status PENDING  Incomplete  Aerobic/Anaerobic Culture w Gram Stain (surgical/deep wound)     Status: None (Preliminary result)   Collection Time: 06/18/21  5:53 PM   Specimen: Tissue  Result Value Ref Range Status   Specimen Description TISSUE  Final   Special Requests EPIDURAL  Final   Gram Stain   Final    MODERATE WBC PRESENT,BOTH PMN AND MONONUCLEAR RARE GRAM NEGATIVE RODS Performed at [facility], [address] [place], [place] [address]    Culture   Final    ABUNDANT SERRATIA MARCESCENS NO ANAEROBES ISOLATED; CULTURE IN PROGRESS FOR 5 DAYS    Report Status PENDING  Incomplete   Organism ID, Bacteria SERRATIA MARCESCENS  Final      Susceptibility   Serratia marcescens - MIC*    CEFAZOLIN >=64 RESISTANT Resistant     CEFEPIME <=0.12 SENSITIVE Sensitive     CEFTAZIDIME <=1 SENSITIVE  Sensitive     CEFTRIAXONE <=0.25 SENSITIVE Sensitive     CIPROFLOXACIN <=0.25 SENSITIVE Sensitive     GENTAMICIN <=1 SENSITIVE Sensitive     TRIMETH/SULFA <=20 SENSITIVE Sensitive     * ABUNDANT SERRATIA MARCESCENS     Studies: No results found.  Scheduled Meds:  docusate sodium  100 mg Oral BID   feeding supplement  237 mL Oral BID BM   ketorolac  15 mg Intravenous Q8H   metaxalone  400 mg Oral QID   multivitamin with minerals  1 tablet Oral Daily   pregabalin  25 mg Oral TID   sodium chloride flush  3 mL Intravenous Q12H   Continuous Infusions:  ceFEPime (MAXIPIME) IV 2 g (06/22/21 0626)    Principal Problem:   Epidural abscess Active Problems:   Heroin abuse ([place])   Homeless   COVID-19 virus infection   Consultants: Neurosurgery Infectious disease  Procedures: 2D echocardiogram Cervical discectomy with debridement of epidural abscess and placement of intervertebral lordotic cage  Antibiotics: Meropenem x1 dose 9/1 Vancomycin IV 9/1 through 9/4 Ceftriaxone 9/2 through 9/4 Cefepime 9/5 >>   Time spent: 35 minutes    Erin Hearing ANP  [facility] 7 am - 330 pm/M-F for direct patient care and secure chat Please refer to Amion for contact info 5  days

## 2021-06-22 NOTE — Progress Notes (Signed)
[facility] Infectious Diseases Follow Up Note  Patient Identification: Patient Name: Gerald Oliver MRN: [id number] [place] Date: 06/17/2021  7:31 AM Age: 30 y.o.Today's Date: 06/22/2021   Reason for Visit: cervical epidural abscess  Principal Problem:   Epidural abscess Active Problems:   Heroin abuse ([place])   Homeless   COVID-19 virus infection   Antibiotics: cefepime 9/5-current                     Total days of abtx day 6          Lines/Tubes: PIVs   Interval Events: continues to be afebrile, 9/2 OR cultures with Serratia marcescens    Assessment Cervical Epidural Abscess s/p C5-C6 ACDF( Instrumented fusion) on 9/2 with OR cultures growing Serratia marcescens: Blood cultures 9/1 and 9/2 have all remained negative. 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  4.   SARScov2: asymptomatic   Recommendations -Continue cefepime as is for 6 weeks from 9/2. He plans to stay inpatient for the duration of tx from conversation today. However, if he decides to leave, reasonable to give him Ciprofloxacin equivalent to  the remainder of treatment.  -Monitor CBC and BMP weekly, ESR and CRP once in every 2 weeks -COVID management per primary  -COVID isolation precautions per IP -Call us back when he is approaching end of treatment for re-evaluation regarding duration of abtx.  -Discussed with patient/ID pharmacy  -ID will sign off. Please call with questions.   Rest of the management as per the primary team. Thank you for the consult.  ______________________________________________________________________ Subjective patient seen and examined at the bedside. Having breakfast. Complains of soreness in his anterior neck. Denies any nausea/vomiting, abdominal pain, diarrhea and rashes   Vitals BP 118/64 (BP Location: Right Arm)   Pulse 67    Temp 98.8 F (37.1 C) (Oral)   Resp 16   Ht '5\' 9"'  (1.753 m)   Wt 64.5 kg   SpO2 99%   BMI 21.00 kg/m     Physical Exam Constitutional:  not in acute distress and appears comfortable     Comments: anterior neck surgical site has a honey comb dressing, C/D/I   Cardiovascular:     Rate and Rhythm: Normal rate and regular rhythm.     Heart sounds:   Pulmonary:     Effort: Pulmonary effort is normal.     Comments: clear lung sounds bilaterally   Abdominal:     Palpations: Abdomen is soft.     Tenderness: Non tender and non disteneded, BS+  Musculoskeletal:        General: No swelling or tenderness.   Skin:    Comments: No lesions or rashes   Neurological:     General: moves all extremities equally  Psychiatric:        Mood and Affect: Mood normal. Calm and cooperative  Pertinent Microbiology Results for orders placed or performed during the hospital encounter of 06/17/21  Culture, blood (routine x 2)     Status: None   Collection Time: 06/17/21  2:03 PM   Specimen: BLOOD LEFT HAND  Result Value Ref Range Status   Specimen Description BLOOD LEFT HAND  Final   Special Requests   Final    BOTTLES DRAWN AEROBIC AND ANAEROBIC Blood Culture adequate volume   Culture   Final    NO GROWTH 5 DAYS Performed at [facility]  [facility], [place] [address] [place], [place] [address]    Report Status 06/22/2021 FINAL  Final  Resp Panel by RT-PCR (Flu A&B, Covid) Nasopharyngeal Swab     Status: Abnormal   Collection Time: 06/17/21  6:24 PM   Specimen: Nasopharyngeal Swab; Nasopharyngeal(NP) swabs in vial transport medium  Result Value Ref Range Status   SARS Coronavirus 2 by RT PCR POSITIVE (A) NEGATIVE Final    Comment: RESULT CALLED TO, READ BACK BY AND VERIFIED WITH: GRACE TATE RN 06/17/21 '@2032'  BY JW (NOTE) SARS-CoV-2 target nucleic acids are DETECTED.  The SARS-CoV-2 RNA is generally detectable in upper respiratory specimens during the acute phase of infection. Positive results  are indicative of the presence of the identified virus, but do not rule out bacterial infection or co-infection with other pathogens not detected by the test. Clinical correlation with patient history and other diagnostic information is necessary to determine patient infection status. The expected result is Negative.  Fact Sheet for Patients: EntrepreneurPulse.com.au  Fact Sheet for Healthcare Providers: IncredibleEmployment.be  This test is not yet approved or cleared by the [place] FDA and  has been authorized for detection and/or diagnosis of SARS-CoV-2 by FDA under an Emergency Use Authorization (EUA).  This EUA will remain in effect (meaning this test can be  used) for the duration of  the COVID-19 declaration under Section 564(b)(1) of the Act, 21 U.S.C. section 360bbb-3(b)(1), unless the authorization is terminated or revoked sooner.     Influenza A by PCR NEGATIVE NEGATIVE Final   Influenza B by PCR NEGATIVE NEGATIVE Final    Comment: (NOTE) The Xpert Xpress SARS-CoV-2/FLU/RSV plus assay is intended as an aid in the diagnosis of influenza from Nasopharyngeal swab specimens and should not be used as a sole basis for treatment. Nasal washings and aspirates are unacceptable for Xpert Xpress SARS-CoV-2/FLU/RSV testing.  Fact Sheet for Patients: EntrepreneurPulse.com.au  Fact Sheet for Healthcare Providers: IncredibleEmployment.be  This test is not yet approved or cleared by the [place] FDA and has been authorized for detection and/or diagnosis of SARS-CoV-2 by FDA under an Emergency Use Authorization (EUA). This EUA will remain in effect (meaning this test can be used) for the duration of the COVID-19 declaration under Section 564(b)(1) of the Act, 21 U.S.C. section 360bbb-3(b)(1), unless the authorization is terminated or revoked.  Performed at [facility], [place] [address],  [place], [place] [address]   Culture, blood (Routine X 2) w Reflex to ID Panel     Status: None (Preliminary result)   Collection Time: 06/18/21 12:06 AM   Specimen: BLOOD  Result Value Ref Range Status   Specimen Description BLOOD SITE NOT SPECIFIED  Final   Special Requests   Final    BOTTLES DRAWN AEROBIC AND ANAEROBIC Blood Culture adequate volume   Culture   Final    NO GROWTH 4 DAYS Performed at [facility], [address] [place], [place] [address]    Report Status PENDING  Incomplete  Culture, blood (routine x 2)     Status: None (Preliminary result)   Collection Time: 06/18/21  9:26 AM   Specimen: BLOOD RIGHT HAND  Result Value Ref Range Status   Specimen Description BLOOD RIGHT HAND  Final   Special Requests   Final    BOTTLES DRAWN AEROBIC AND ANAEROBIC Blood Culture adequate volume   Culture   Final    NO GROWTH 4 DAYS Performed at [facility], [place] [address],  [place], [place] [address]    Report Status PENDING  Incomplete  Culture, blood (routine x 2)     Status: None (Preliminary result)   Collection Time: 06/18/21  9:30 AM   Specimen: BLOOD RIGHT WRIST  Result Value Ref Range Status   Specimen Description BLOOD RIGHT WRIST  Final   Special Requests   Final    BOTTLES DRAWN AEROBIC ONLY Blood Culture results may not be optimal due to an inadequate volume of blood received in culture bottles   Culture   Final    NO GROWTH 4 DAYS Performed at [facility], [place] [address], [place], [place] [address]    Report Status PENDING  Incomplete  Aerobic/Anaerobic Culture w Gram Stain (surgical/deep wound)     Status: None (Preliminary result)   Collection Time: 06/18/21  5:48 PM   Specimen: Abscess; Wound  Result Value Ref Range Status   Specimen Description ABSCESS  Final   Special Requests EPIDURAL  Final   Gram Stain   Final    FEW WBC PRESENT,BOTH PMN AND MONONUCLEAR NO ORGANISMS SEEN Performed at [facility], [address]  [place], [place] [address]    Culture   Final    FEW SERRATIA MARCESCENS SUSCEPTIBILITIES PERFORMED ON PREVIOUS CULTURE WITHIN THE LAST 5 DAYS. NO ANAEROBES ISOLATED; CULTURE IN PROGRESS FOR 5 DAYS    Report Status PENDING  Incomplete  Aerobic/Anaerobic Culture w Gram Stain (surgical/deep wound)     Status: None (Preliminary result)   Collection Time: 06/18/21  5:53 PM   Specimen: Tissue  Result Value Ref Range Status   Specimen Description TISSUE  Final   Special Requests EPIDURAL  Final   Gram Stain   Final    MODERATE WBC PRESENT,BOTH PMN AND MONONUCLEAR RARE GRAM NEGATIVE RODS Performed at [facility], [address], [place], [place] [address]    Culture   Final    ABUNDANT SERRATIA MARCESCENS NO ANAEROBES ISOLATED; CULTURE IN PROGRESS FOR 5 DAYS    Report Status PENDING  Incomplete   Organism ID, Bacteria SERRATIA MARCESCENS  Final      Susceptibility   Serratia marcescens - MIC*    CEFAZOLIN >=64 RESISTANT Resistant     CEFEPIME <=0.12 SENSITIVE Sensitive     CEFTAZIDIME <=1 SENSITIVE Sensitive     CEFTRIAXONE <=0.25 SENSITIVE Sensitive     CIPROFLOXACIN <=0.25 SENSITIVE Sensitive     GENTAMICIN <=1 SENSITIVE Sensitive     TRIMETH/SULFA <=20 SENSITIVE Sensitive     * ABUNDANT SERRATIA MARCESCENS    Pertinent Lab. CBC Latest Ref Rng & Units 06/18/2021 06/17/2021 02/24/2021  WBC 4.0 - 10.5 K/uL 12.0(H) 12.8(H) 7.9  Hemoglobin 13.0 - 17.0 g/dL 11.7(L) 11.9(L) 13.8  Hematocrit 39.0 - 52.0 % 35.3(L) 36.7(L) 43.7  Platelets 150 - 400 K/uL 116(L) 104(L) 189   CMP Latest Ref Rng & Units 06/21/2021 06/19/2021 06/18/2021  Glucose 70 - 99 mg/dL 119(H) 170(H) 204(H)  BUN 6 - 20 mg/dL '18 13 14  ' Creatinine 0.61 - 1.24 mg/dL 0.82 0.73 0.91  Sodium 135 - 145 mmol/L 138 133(L) 134(L)  Potassium 3.5 - 5.1 mmol/L 4.1 4.6 3.7  Chloride 98 - 111 mmol/L 100 102 102  CO2 22 - 32 mmol/L 32 25 23  Calcium 8.9 - 10.3 mg/dL 9.6 8.8(L) 8.4(L)  Total Protein 6.5 - 8.1 g/dL - - -  Total  Bilirubin 0.3 - 1.2 mg/dL - - -  Alkaline Phos 38 - 126 U/L - - -  AST 15 - 41 U/L - - -  ALT 0 - 44 U/L - - -     Pertinent Imaging today Plain films and CT images have been personally visualized and interpreted; radiology reports have been reviewed. Decision making incorporated into the Impression / Recommendations.  I spent more than 35 minutes for this patient encounter including review of prior medical records, coordination of care  with greater than 50% of time being face to face/counseling and discussing diagnostics/treatment plan with the patient/family.  Electronically signed by:   Rosiland Oz, MD Infectious Disease Physician [facility] for Infectious Disease Pager: [phone]

## 2021-06-22 NOTE — Progress Notes (Signed)
Pt has slept more tonight than in the last 2 nights and therefore has requested less pain meds. Pt stated when he is discharged from here he is going to go across the street and check himself back into the drug rehab program that he was on before only this time he's going to complete it and stay sober.

## 2021-06-23 LAB — CULTURE, BLOOD (ROUTINE X 2)
Culture: NO GROWTH
Culture: NO GROWTH
Culture: NO GROWTH
Special Requests: ADEQUATE
Special Requests: ADEQUATE

## 2021-06-23 MED ORDER — OXYCODONE HCL 5 MG/5ML PO SOLN: 15.0000 mg | ORAL | Status: DC

## 2021-06-23 MED ORDER — ZOLPIDEM TARTRATE 5 MG PO TABS
5.0000 mg | ORAL_TABLET | Freq: Every day | ORAL | Status: DC
  Administered 2021-06-23 – 2021-06-24 (×2): 5 mg via ORAL
  Filled 2021-06-23 (×2): qty 1

## 2021-06-23 MED ORDER — OXYCODONE HCL 5 MG/5ML PO SOLN
15.0000 mg | ORAL | Status: DC
  Administered 2021-06-23 – 2021-06-24 (×5): 15 mg via ORAL
  Filled 2021-06-23 (×5): qty 15

## 2021-06-23 MED ORDER — ESCITALOPRAM OXALATE 10 MG PO TABS
10.0000 mg | ORAL_TABLET | Freq: Every day | ORAL | Status: DC
  Administered 2021-06-23 – 2021-07-07 (×15): 10 mg via ORAL
  Filled 2021-06-23 (×15): qty 1

## 2021-06-23 NOTE — Progress Notes (Signed)
[facility] PROGRESS NOTE  Gerald Oliver VQQ:[id number] DOB: 1991/01/19 DOA: 06/17/2021 PCP: Patient, No Pcp Per (Inactive)  Status: Remains inpatient appropriate because:Unsafe d/c plan, IV treatments appropriate due to intensity of illness or inability to take PO, and Inpatient level of care appropriate due to severity of illness  Dispo: The patient is from:  Homeless              Anticipated d/c is to:  Homeless-back to streets per his request              Patient currently is not medically stable to d/c.   Difficult to place patient No    Level of care: Telemetry Medical  Code Status: Full Family Communication: Patient only DVT prophylaxis: SCDs COVID vaccination status: Found to have incidental COVID this admission-asymptomatic-apparently was not immunized prior to admission   HPI: 30 year old male with history of IV drug use, prior heroin overdose in May 2022 comes into the hospital with upper back pain.  This is been going on for the last several days progressively getting worse.  He also reports subjective fevers.  He also smokes tobacco, marijuana and occasionally drinks EtOH.  He uses heroin regularly, last used 2 days prior to admission.  Of note, his brother is also hospitalized with IV drug use related complications.  On admission, he was noted to be incidentally COVID-positive.  MRI on admission showed an epidural abscess, neurosurgery consulted  Subjective: Alert.  Very anxious regarding timeliness of administration of medications, insomnia and overall depression.  Patient had misunderstanding that all of his pain medications were ordered as scheduled and he did not realize he had to ask for these medications in advance.  Discussed with him that I could change his oxycodone to scheduled but would not change IV Dilaudid to scheduled.  I suggested instead of taking the IV Dilaudid with the oxycodone dose that he staggered in between oxycodone doses to achieve better  pain control. I also spoke with him regarding recent increase in Lyrica and needing to give this time to have appropriate effectiveness.  I told him I would add medication scheduled at bedtime for sleep and have started him on an antidepressant and explained rationale for doing so.  He agreed.  Objective: Vitals:   06/23/21 0328 06/23/21 0400  BP: (!) 102/49   Pulse: 69   Resp: 11   Temp:  98.6 F (37 C)  SpO2: 96%     Intake/Output Summary (Last 24 hours) at 06/23/2021 0739 Last data filed at 06/22/2021 1823 Gross per 24 hour  Intake 920 ml  Output 800 ml  Net 120 ml   Filed Weights   06/19/21 0545 06/20/21 0425 06/21/21 0416  Weight: 64.6 kg 64.8 kg 64.5 kg    Exam: Constitutional: NAD, anxious, continues to report difficulty swallowing Neck: Operative dressing clean dry and intact Respiratory: Room air, lungs clear, no increased work of breathing, no coughing Cardiovascular: Normotensive, regular pulse, no peripheral edema, skin warm and dry with appropriate capillary refill, normal heart sounds. Abdomen: no tenderness,  Bowel sounds positive.  Difficulty swallowing impacting oral intake.  LBM 9/6 Neurologic: CN 2-12 grossly intact. Sensation intact, Strength 5/5 x all 4 extremities.  Psychiatric: Normal judgment and insight. Alert and oriented x 3. Normal mood.    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  oxycodone, patient in severe pain, will not change Dilaudid dose. -Continue Oxy IR elixir form with same dosage of 15 mg every 4 hours sch with IV Toradol prn - continue Lyrica to 75 mg TID - continue baclofen elixir form -Operative culture positive for Serratia.  ID has discontinued vancomycin and Rocephin as of 9/5 in favor of cefepime  Insomnia/depression -Begin scheduled Ambien HS -Begin Lexapro  Heroin, polysubstance abuse  -As of 9/7 patient continues with inadequate pain control and mentioned possibly transitioning to methadone.  If by next week he continues to have an adequate pain control will discuss transitioning to higher than previous dose of methadone to treat acute pain with taper down to expected discharge dose during hospitalization-see above regarding recent changes in pain regimen -Patient wishes to follow-up with methadone clinic after discharge -TTE negative for endocarditis   Covid positive -Asymptomatic -Continue 10-day isolation until 9/11  Odynophagia -Continue Cepacol spray -Continue elixir medications where appropriate     Data Reviewed: Basic Metabolic Panel: Recent Labs  Lab 06/17/21 1007 06/18/21 0006 06/19/21 0315 06/21/21 0149  NA 133* 134* 133* 138  K 4.0 3.7 4.6 4.1  CL 99 102 102 100  CO2 _0 32  GLUCOSE 122* 204* 170* 119*  BUN _1 CREATININE 0.78 0.91 0.73 0.82  CALCIUM 8.9 8.4* 8.8* 9.6   Liver Function Tests: Recent Labs  Lab 06/17/21 1007  AST 13*  ALT 9  ALKPHOS 83  BILITOT 0.8  PROT 7.1  ALBUMIN 3.1*    CBC: Recent Labs  Lab 06/17/21 1007 06/18/21 0006  WBC 12.8* 12.0*  NEUTROABS 9.7*  --   HGB 11.9* 11.7*  HCT 36.7* 35.3*  MCV 82.3 80.4  PLT 104* 116*    Scheduled Meds:  baclofen  5 mg Oral QID   docusate  100 mg Oral BID   feeding supplement  237 mL Oral BID BM   multivitamin  15 mL Oral Daily   pregabalin  75 mg Oral TID   sodium chloride flush  3 mL Intravenous Q12H   Continuous  Infusions:  ceFEPime (MAXIPIME) IV 2 g (06/23/21 0603)    Principal Problem:   Epidural abscess Active Problems:   Heroin abuse ([place])   Homeless   COVID-19 virus infection   Consultants: Neurosurgery Infectious disease  Procedures: 2D echocardiogram Cervical discectomy with debridement of epidural abscess and placement of intervertebral lordotic cage  Antibiotics: Meropenem x1 dose 9/1 Vancomycin IV 9/1 through 9/4 Ceftriaxone 9/2 through 9/4 Cefepime 9/5 >>   Time spent: 35 minutes    Erin Hearing ANP  [facility] 7 am - 330 pm/M-F for direct patient care and secure chat Please refer to Amion for contact info 6  days

## 2021-06-23 NOTE — Plan of Care (Signed)
  Problem: Acute Rehab PT Goals(only PT should resolve) Goal: Pt will Roll Supine to Side Outcome: Completed/Met Goal: Pt Will Go Supine/Side To Sit Outcome: Completed/Met Goal: Patient Will Transfer Sit To/From Stand Outcome: Completed/Met Goal: Pt Will Ambulate Outcome: Completed/Met Goal: Pt Will Verbalize and Adhere to Precautions While Description: PT Will Verbalize and Adhere to Precautions While Performing Mobility Outcome: Completed/Met  Goals met, PT discharged Magda Kiel, [place] KZLDJ:[phone] [phone] 06/23/2021

## 2021-06-23 NOTE — Plan of Care (Signed)
  Problem: Education: Goal: Knowledge of General Education information will improve Description: Including pain rating scale, medication(s)/side effects and non-pharmacologic comfort measures Outcome: Progressing   Problem: Health Behavior/Discharge Planning: Goal: Ability to manage health-related needs will improve Outcome: Progressing   Problem: Clinical Measurements: Goal: Ability to maintain clinical measurements within normal limits will improve Outcome: Progressing Goal: Will remain free from infection Outcome: Progressing Goal: Respiratory complications will improve Outcome: Not Applicable Goal: Cardiovascular complication will be avoided Outcome: Progressing   Problem: Activity: Goal: Risk for activity intolerance will decrease Outcome: Progressing   Problem: Nutrition: Goal: Adequate nutrition will be maintained Outcome: Progressing

## 2021-06-23 NOTE — Plan of Care (Signed)

## 2021-06-23 NOTE — Progress Notes (Signed)
Physical Therapy Treatment & Discharge Patient Details Name: Gerald Oliver MRN: [id number] DOB: December 31, 1990 Today's Date: 06/23/2021    History of Present Illness 30 yo male [facility] with neck pain x2 days. MRI suggestive of epidural abscess. s/p ACDF C5-6 on 9/2. 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    PT Comments    Patient progressing with mobility and pain better controlled this pm.  Was very resistant when attempted in am, but more eager this pm and discussing plans for d/c. Continued education on precautions and importance of follow through with antibiotics and MD follow up.  Patient met PT goals.  No further skilled PT needs at this time.  Will sign off.    Follow Up Recommendations  No PT follow up     Equipment Recommendations  None recommended by PT    Recommendations for Other Services       Precautions / Restrictions Precautions Precautions: Cervical    Mobility  Bed Mobility Overal bed mobility: Modified Independent                  Transfers Overall transfer level: Independent                  Ambulation/Gait Ambulation/Gait assistance: Independent Gait Distance (Feet): 200 Feet (in room back and forth to door and window) Assistive device: None Gait Pattern/deviations: WFL(Within Functional Limits)     General Gait Details: still slight flexion in thoracic spine, improves with cues, patient moving neck appropriate for mobilizing in the room, continued education on precautions throughout   Stairs             Wheelchair Mobility    Modified Rankin (Stroke Patients Only)       Balance Overall balance assessment: Independent             Standing balance comment: standing at window pointing out clinic where he gets Methadone down the street, no LOB                            Cognition Arousal/Alertness: Awake/alert Behavior During Therapy: WFL  for tasks assessed/performed Overall Cognitive Status: Within Functional Limits for tasks assessed                                        Exercises      General Comments General comments (skin integrity, edema, etc.): offered to pt to practice getting on the floor as he presumes will still be sleeping on the ground at d/c, but felt he did not need to practice.      Pertinent Vitals/Pain Pain Score: 5  Pain Location: neck Pain Descriptors / Indicators: Sore;Operative site guarding Pain Intervention(s): Monitored during session    Home Living                      Prior Function            PT Goals (current goals can now be found in the care plan section) Progress towards PT goals: Progressing toward goals    Frequency           PT Plan Current plan remains appropriate    Co-evaluation              AM-PAC PT "6 Clicks" Mobility  Outcome Measure  Help needed turning from your back to your side while in a flat bed without using bedrails?: None Help needed moving from lying on your back to sitting on the side of a flat bed without using bedrails?: None Help needed moving to and from a bed to a chair (including a wheelchair)?: None Help needed standing up from a chair using your arms (e.g., wheelchair or bedside chair)?: None Help needed to walk in hospital room?: None Help needed climbing 3-5 steps with a railing? : None 6 Click Score: 24    End of Session   Activity Tolerance: Patient tolerated treatment well Patient left: in bed;with call bell/phone within reach (sitting EOB, IV RN entering)   PT Visit Diagnosis: Other abnormalities of gait and mobility (R26.89)     Time: 0262-8549 PT Time Calculation (min) (ACUTE ONLY): 24 min  Charges:  $Gait Training: 23-37 mins                     Magda Kiel, PT Acute Rehabilitation Services Pager:[phone] [phone] 06/23/2021    Reginia Naas 06/23/2021, 4:32 PM

## 2021-06-24 LAB — AEROBIC/ANAEROBIC CULTURE W GRAM STAIN (SURGICAL/DEEP WOUND)

## 2021-06-24 LAB — BASIC METABOLIC PANEL
Anion gap: 9 (ref 5–15)
Anion gap: 9 (ref 5–15)
BUN: 26 mg/dL — ABNORMAL HIGH (ref 6–20)
BUN: 27 mg/dL — ABNORMAL HIGH (ref 6–20)
CO2: 29 mmol/L (ref 22–32)
CO2: 30 mmol/L (ref 22–32)
Calcium: 9.8 mg/dL (ref 8.9–10.3)
Calcium: 9.6 mg/dL (ref 8.9–10.3)
Chloride: 96 mmol/L — ABNORMAL LOW (ref 98–111)
Chloride: 94 mmol/L — ABNORMAL LOW (ref 98–111)
Creatinine, Ser: 0.88 mg/dL (ref 0.61–1.24)
Creatinine, Ser: 0.84 mg/dL (ref 0.61–1.24)
GFR, Estimated: >60 mL/min (ref >60)
GFR, Estimated: >60 mL/min (ref >60)
Glucose, Bld: 99 mg/dL (ref 70–99)
Glucose, Bld: 95 mg/dL (ref 70–99)
Potassium: 5.4 mmol/L — ABNORMAL HIGH (ref 3.5–5.1)
Potassium: 5.1 mmol/L (ref 3.5–5.1)
Sodium: 134 mmol/L — ABNORMAL LOW (ref 135–145)
Sodium: 133 mmol/L — ABNORMAL LOW (ref 135–145)

## 2021-06-24 LAB — CBC WITH DIFFERENTIAL/PLATELET
Abs Immature Granulocytes: 0.21 10*3/uL — ABNORMAL HIGH (ref 0.00–0.07)
Basophils Absolute: 0.1 10*3/uL (ref 0.0–0.1)
Basophils Relative: 1 %
Eosinophils Absolute: 0.3 10*3/uL (ref 0.0–0.5)
Eosinophils Relative: 2 %
HCT: 37.5 % — ABNORMAL LOW (ref 39.0–52.0)
Hemoglobin: 11.9 g/dL — ABNORMAL LOW (ref 13.0–17.0)
Immature Granulocytes: 2 %
Lymphocytes Relative: 18 %
Lymphs Abs: 2.3 10*3/uL (ref 0.7–4.0)
MCH: 26.1 pg (ref 26.0–34.0)
MCHC: 31.7 g/dL (ref 30.0–36.0)
MCV: 82.2 fL (ref 80.0–100.0)
Monocytes Absolute: 0.9 10*3/uL (ref 0.1–1.0)
Monocytes Relative: 7 %
Neutro Abs: 8.9 10*3/uL — ABNORMAL HIGH (ref 1.7–7.7)
Neutrophils Relative %: 70 %
Platelets: 362 10*3/uL (ref 150–400)
RBC: 4.56 MIL/uL (ref 4.22–5.81)
RDW: 13.8 % (ref 11.5–15.5)
WBC: 12.6 10*3/uL — ABNORMAL HIGH (ref 4.0–10.5)
nRBC: 0 % (ref 0.0–0.2)

## 2021-06-24 LAB — SEDIMENTATION RATE: Sed Rate: 64 mm/hr — ABNORMAL HIGH (ref 0–16)

## 2021-06-24 LAB — C-REACTIVE PROTEIN: CRP: 2.9 mg/dL — ABNORMAL HIGH (ref <1.0)

## 2021-06-24 MED ORDER — DOCUSATE SODIUM 100 MG PO CAPS
100.0000 mg | ORAL_CAPSULE | Freq: Two times a day (BID) | ORAL | Status: DC
  Administered 2021-06-25 – 2021-07-16 (×41): 100 mg via ORAL
  Filled 2021-06-24 (×46): qty 1

## 2021-06-24 MED ORDER — IBUPROFEN 200 MG PO TABS
400.0000 mg | ORAL_TABLET | Freq: Four times a day (QID) | ORAL | Status: DC
  Administered 2021-06-24 – 2021-07-16 (×86): 400 mg via ORAL
  Filled 2021-06-24 (×88): qty 2

## 2021-06-24 MED ORDER — IBUPROFEN 100 MG/5ML PO SUSP
400.0000 mg | Freq: Four times a day (QID) | ORAL | Status: DC
  Administered 2021-06-24: 400 mg via ORAL
  Filled 2021-06-24 (×2): qty 20

## 2021-06-24 MED ORDER — OXYCODONE HCL ER 10 MG PO T12A
40.0000 mg | EXTENDED_RELEASE_TABLET | Freq: Two times a day (BID) | ORAL | Status: DC
  Administered 2021-06-24 – 2021-06-30 (×13): 40 mg via ORAL
  Filled 2021-06-24 (×13): qty 2

## 2021-06-24 MED ORDER — BACLOFEN 10 MG PO TABS
5.0000 mg | ORAL_TABLET | Freq: Four times a day (QID) | ORAL | Status: DC
  Administered 2021-06-24 – 2021-07-01 (×26): 5 mg via ORAL
  Filled 2021-06-24 (×26): qty 1

## 2021-06-24 MED ORDER — ADULT MULTIVITAMIN W/MINERALS CH
1.0000 | ORAL_TABLET | Freq: Every day | ORAL | Status: DC
  Administered 2021-06-25 – 2021-07-18 (×22): 1 via ORAL
  Filled 2021-06-24 (×25): qty 1

## 2021-06-24 NOTE — Plan of Care (Signed)
  Problem: Education: Goal: Knowledge of General Education information will improve Description: Including pain rating scale, medication(s)/side effects and non-pharmacologic comfort measures Outcome: Progressing   Problem: Health Behavior/Discharge Planning: Goal: Ability to manage health-related needs will improve Outcome: Progressing   Problem: Clinical Measurements: Goal: Ability to maintain clinical measurements within normal limits will improve Outcome: Progressing   Problem: Clinical Measurements: Goal: Cardiovascular complication will be avoided Outcome: Progressing   Problem: Coping: Goal: Level of anxiety will decrease Outcome: Progressing   Problem: Pain Managment: Goal: General experience of comfort will improve Outcome: Progressing   Problem: Safety: Goal: Ability to remain free from injury will improve Outcome: Progressing   Problem: Skin Integrity: Goal: Risk for impaired skin integrity will decrease Outcome: Progressing

## 2021-06-24 NOTE — Progress Notes (Signed)
Nutrition Follow-up  DOCUMENTATION CODES:  Not applicable  INTERVENTION:  -Continue Ensure po BID, each supplement provides 350 kcal and 13-20 grams of protein -Continue Magic cup TID with meals, each supplement provides 290 kcal and 9 grams of protein -Continue MVI with minerals daily  NUTRITION DIAGNOSIS:  Increased nutrient needs related to acute illness (epidural abscess) as evidenced by estimated needs. -- ongoing  GOAL:  Patient will meet greater than or equal to 90% of their needs -- progressing  MONITOR:  PO intake, Supplement acceptance, Labs, Weight trends, I & O's  REASON FOR ASSESSMENT:  Consult Assessment of nutrition requirement/status  ASSESSMENT:  30 yo male with a PMH of IVDA and prior heroin overdose in 02/2021 presenting with upper back pain.  He reports onset of pain last night.  +subjective fevers.  He occasionally smokes tobacco and marijuana and occasionally drinks alcohol.  He uses heroin routinely, last use 2 days ago.  He moved to [place] at age [age] and dropped out of school shortly in February 08, 2023 grade.  His father died when he was 50.  His mother died of esophageal cancer in 02/07/17.  He and his brother are both homeless and heroin abusers.  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 Admitted with epidural abscess.  9/02 - s/p C5-C6 ACDF   Pt continues to have excessive anxiety which is impacting his ability to remember information that has been given to him. Per NP, it is also impacting his sleep/wake cycle and ability to tolerate pain. NP also notes pt continues to lack insight regarding his substance abuse and has been stating "I do not have an addictive personality" despite pt's needs for acute pain medications with eventual transition to methadone.   Per RN, pt has had a great appetite and has required that boundaries be set regarding food as pt "ate all night long with multiple requests for ice cream" PO Intake: 100% x last 8 recorded meals  Pt doing  well with Ensure Enlive/Plus BID Recommend continue current nutrition plan of care   Medications: colace, mvi with minerals Labs: Na 133 (L)  UOP: 2L x24 hours I/O: -3.1L since admit   Diet Order:   Diet Order             Diet regular Room service appropriate? Yes; Fluid consistency: Thin  Diet effective now                  EDUCATION NEEDS:  No education needs have been identified at this time  Skin:  Skin Assessment: Reviewed RN Assessment  Last BM:  9/7  Height:  Ht Readings from Last 1 Encounters:  06/19/21 5\' 9"  (1.753 m)   Weight:  Wt Readings from Last 4 Encounters:  06/24/21 63 kg  03/13/21 65 kg  02/23/21 64.1 kg  05/07/18 66.9 kg   BMI:  Body mass index is 20.5 kg/m.  Estimated Nutritional Needs:  Kcal:  2100-2300 Protein:  80-95 grams Fluid:  >2.1 L    05/09/18, MS, RD, LDN (she/her/hers) RD pager number and weekend/on-call pager number located in Amion.

## 2021-06-24 NOTE — Progress Notes (Signed)
Pharmacy Antibiotic Note  Gerald Oliver is a 30 y.o. male admitted on 06/17/2021 with  epidural abscess , culture grew Serratia marcescens.  Pharmacy has been consulted for cefepime dosing.  Renal function stable, afebrile, WBC 12.6.  Plan: Continue cefepime 2gm IV Q8H Pharmacy will sign off with stable renal fxn.  Thank you for the consult! ID rec to treat for 6 weeks from 9/2  Height: 5\' 9"  (175.3 cm) Weight: 63 kg (138 lb 12.8 oz) IBW/kg (Calculated) : 70.7  Temp (24hrs), Avg:98.3 F (36.8 C), Min:98 F (36.7 C), Max:98.6 F (37 C)  Recent Labs  Lab 06/18/21 0006 06/19/21 0315 06/21/21 0149 06/24/21 0048 06/24/21 0807  WBC 12.0*  --   --  12.6*  --   CREATININE 0.91 0.73 0.82 0.88 0.84  VANCOPEAK  --   --  60*  --   --      Estimated Creatinine Clearance: 112.5 mL/min (by C-G formula based on SCr of 0.84 mg/dL).    No Known Allergies  Vanc 9/1>>9/5 Merrem 9/1>>9/2 CTX 9/2>>9/5 Cefepime 9/5>> (10/13)   9/1 BCx - negative 9/2 BCx - negative 9/2 epidural tissue and abscess - Serratia marascens (S CTX, Cipro, Bactrim)  Gerald Oliver D. 11/2, PharmD, BCPS, BCCCP 06/24/2021, 12:09 PM

## 2021-06-24 NOTE — TOC Progression Note (Signed)
Transition of Care [facility]) - Progression Note    Patient Details  Name: JERELD PRESTI MRN: [id number] Date of Birth: 12/10/1990  Transition of Care [facility]) CM/SW Contact  Janae Bridgeman, RN Phone Number: 06/24/2021, 2:20 PM  Clinical Narrative:    Case management called and patient set up with PCP appointment - noted in the discharge instructions.   Expected Discharge Plan: Homeless Shelter Barriers to Discharge: Homeless with medical needs, Continued Medical Work up  Expected Discharge Plan and Services Expected Discharge Plan: Homeless Shelter In-house Referral: Clinical Social Work Discharge Planning Services: CM Consult, Follow-up appt scheduled Post Acute Care Choice:  (Outpatient program for Methadone treatments) Living arrangements for the past 2 months: Homeless                                       Social Determinants of Health (SDOH) Interventions    Readmission Risk Interventions No flowsheet data found.

## 2021-06-24 NOTE — Progress Notes (Signed)
Patient overall had a good night. Boundaries had to be set regarding food. Patient ate all night long with multiple request for ice cream. Patient was also requesting more sleep medication after he had been  given night meds that included ambien, lyrica, and baclofen. It was explained that his Remus Loffler is only ordered at bedtime and that we do not give sleep medications after 2am. He wants to discuss ambien for day time. I explain I have never seen it order during daytime hours and it is unlikely to be ordered that way.

## 2021-06-24 NOTE — Progress Notes (Signed)
[facility] PROGRESS NOTE  Gerald Oliver [id number] DOB: April 14, 1991 DOA: 06/17/2021 PCP: Patient, No Pcp Per (Inactive)  Status: Remains inpatient appropriate because:Unsafe d/c plan, IV treatments appropriate due to intensity of illness or inability to take PO, and Inpatient level of care appropriate due to severity of illness  Dispo: The patient is from:  Homeless              Anticipated d/c is to:  Homeless-back to streets per his request              Patient currently is not medically stable to d/c.   Difficult to place patient No    Level of care: Telemetry Medical  Code Status: Full Family Communication: Patient only DVT prophylaxis: SCDs COVID vaccination status: Found to have incidental COVID this admission-asymptomatic-apparently was not immunized prior to admission   HPI: 30 year old male with history of IV drug use, prior heroin overdose in May 2022 comes into the hospital with upper back pain.  This is been going on for the last several days progressively getting worse.  He also reports subjective fevers.  He also smokes tobacco, marijuana and occasionally drinks EtOH.  He uses heroin regularly, last used 2 days prior to admission.  Of note, his brother is also hospitalized with IV drug use related complications.  On admission, he was noted to be incidentally COVID-positive.  MRI on admission showed an epidural abscess, neurosurgery consulted  Subjective: Continues to have excessive anxiety which is impacting his ability to remember information given.  This is also impacting his sleep-wake cycle and his ability to tolerate pain.  Objective: Vitals:   06/23/21 2040 06/23/21 2221  BP: 122/75 110/73  Pulse: 72   Resp: 10 20  Temp: 98 F (36.7 C) 98.6 F (37 C)  SpO2: 96%     Intake/Output Summary (Last 24 hours) at 06/24/2021 0755 Last data filed at 06/24/2021 0130 Gross per 24 hour  Intake 460 ml  Output 2000 ml  Net -1540 ml   Filed Weights    06/20/21 0425 06/21/21 0416 06/24/21 0500  Weight: 64.8 kg 64.5 kg 63 kg    Exam: Constitutional: NAD, anxious Neck: Operative dressing clean dry and intact Respiratory: RA, lungs remain clear without coughing or reports of chest discomfort, no increased work of breathing Cardiovascular: S1-S2, regular pulse without tachycardia.  No peripheral edema.  Normotensive. Abdomen: Soft nontender nondistended with normal active bowel sounds.  LBM 9/7 Neurologic: CN 2-12 grossly intact. Sensation intact, Strength 5/5 x all 4 extremities.  Psychiatric: Normal judgment but does lack insight into his addictive behaviors.  Alert and oriented x 3.  Anxious mood.    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 -MRI on admission c/w soft tissue infection with epidural/paraspinal phlegmon at the C4-C6 level  - 06/18/2021, status post discectomy, thecal sac decompression and epidural abscess debridement, also received intervertebral biomechanical lordotic cage as well as anterior instrumentation with interbody plate per NS -ID following and recommending continue cefepime for 6 weeks beginning at 06/18/2021; ID also recommends CBC and BMET weekly with ESR and CRP every 2-week (orders placed) -Suspect waxing and waning levels of the shorter acting narcotic affecting adequate pain control in this patient.  Will DC shorter acting medications in favor of OxyContin.  We will continue IV Dilaudid.  He has completed the recommended schedule for IV Toradol therefore we will begin scheduled ibuprofen elixir 400 mg every 6 hours.   -Patient is aware that once he  is transition to methadone he will no longer be eligible for IV Dilaudid.  He verbalized understanding. - continue Lyrica and baclofen -Operative culture positive for Serratia.  ID has discontinued vancomycin and Rocephin as of 9/5 in favor of cefepime  Insomnia/depression -Begin scheduled Ambien HS -Begin Lexapro  Heroin,  polysubstance abuse  -Continues to lack insight regarding his substance abuse stating "I do not have an addictive personality".  See above regarding requirement for acute pain medications with eventual transition to methadone. -Patient wishes to follow-up with methadone clinic after discharge -TTE negative for endocarditis   Covid positive -Asymptomatic -Continue 10-day isolation until 9/11  Odynophagia -Continue Cepacol spray -Continue elixir medications where appropriate     Data Reviewed: Basic Metabolic Panel: Recent Labs  Lab 06/17/21 1007 06/18/21 0006 06/19/21 0315 06/21/21 0149 06/24/21 0048  NA 133* 134* 133* 138 134*  K 4.0 3.7 4.6 4.1 5.4*  CL 99 102 102 100 96*  CO2 '26 23 25 ' 32 29  GLUCOSE 122* 204* 170* 119* 99  BUN '12 14 13 18 ' 26*  CREATININE 0.78 0.91 0.73 0.82 0.88  CALCIUM 8.9 8.4* 8.8* 9.6 9.8   Liver Function Tests: Recent Labs  Lab 06/17/21 1007  AST 13*  ALT 9  ALKPHOS 83  BILITOT 0.8  PROT 7.1  ALBUMIN 3.1*    CBC: Recent Labs  Lab 06/17/21 1007 06/18/21 0006 06/24/21 0048  WBC 12.8* 12.0* 12.6*  NEUTROABS 9.7*  --  8.9*  HGB 11.9* 11.7* 11.9*  HCT 36.7* 35.3* 37.5*  MCV 82.3 80.4 82.2  PLT 104* 116* 362    Scheduled Meds:  baclofen  5 mg Oral QID   docusate  100 mg Oral BID   escitalopram  10 mg Oral Daily   feeding supplement  237 mL Oral BID BM   multivitamin  15 mL Oral Daily   oxyCODONE  15 mg Oral Q4H   pregabalin  75 mg Oral TID   sodium chloride flush  3 mL Intravenous Q12H   zolpidem  5 mg Oral QHS   Continuous Infusions:  ceFEPime (MAXIPIME) IV 2 g (06/24/21 0436)    Principal Problem:   Epidural abscess Active Problems:   Heroin abuse ([place])   Homeless   COVID-19 virus infection   Consultants: Neurosurgery Infectious disease  Procedures: 2D echocardiogram Cervical discectomy with debridement of epidural abscess and placement of intervertebral lordotic cage  Antibiotics: Meropenem x1 dose  9/1 Vancomycin IV 9/1 through 9/4 Ceftriaxone 9/2 through 9/4 Cefepime 9/5 >>   Time spent: 35 minutes    Erin Hearing ANP  [facility] 7 am - 330 pm/M-F for direct patient care and secure chat Please refer to Amion for contact info 7  days

## 2021-06-25 MED ORDER — ZOLPIDEM TARTRATE 5 MG PO TABS
5.0000 mg | ORAL_TABLET | Freq: Every day | ORAL | Status: DC
  Administered 2021-06-25: 5 mg via ORAL
  Filled 2021-06-25: qty 1

## 2021-06-25 NOTE — Progress Notes (Addendum)
[facility] PROGRESS NOTE  Gerald Oliver [id number]:[id number] DOB: August 10, 1991 DOA: 06/17/2021 PCP: Patient, No Pcp Per (Inactive)  Status: Remains inpatient appropriate because:Unsafe d/c plan, IV treatments appropriate due to intensity of illness or inability to take PO, and Inpatient level of care appropriate due to severity of illness  Dispo: The patient is from:  Homeless              Anticipated d/c is to:  Homeless-back to streets per his request              Patient currently is not medically stable to d/c.   Difficult to place patient No    Level of care: Telemetry Medical  Code Status: Full Family Communication: Patient only DVT prophylaxis: SCDs COVID vaccination status: Found to have incidental COVID this admission-asymptomatic-apparently was not immunized prior to admission   HPI: 30 year old male with history of IV drug use, prior heroin overdose in May 2022 comes into the hospital with upper back pain.  This is been going on for the last several days progressively getting worse.  He also reports subjective fevers.  He also smokes tobacco, marijuana and occasionally drinks EtOH.  He uses heroin regularly, last used 2 days prior to admission.  Of note, his brother is also hospitalized with IV drug use related complications.  On admission, he was noted to be incidentally COVID-positive.  MRI on admission showed an epidural abscess, neurosurgery consulted  Subjective: Alert and remains somewhat anxious.  Focused on the fact that despite order in place for IV Dilaudid staff have been reluctant to administer this medication concurrently with his OxyContin.  Patient states is having much better pain control with the longer acting medications.  He also requested to be transitioned to methadone on Monday when I return from my time off.  Objective: Vitals:   06/24/21 2337 06/25/21 0427  BP: 122/73 131/72  Pulse: 82 78  Resp: 18 19  Temp: 98.2 F (36.8 C) 97.8 F (36.6 C)   SpO2: 96% 96%    Intake/Output Summary (Last 24 hours) at 06/25/2021 0739 Last data filed at 06/25/2021 0531 Gross per 24 hour  Intake 800 ml  Output 1300 ml  Net -500 ml   Filed Weights   06/21/21 0416 06/24/21 0500 06/25/21 0427  Weight: 64.5 kg 63 kg 62.5 kg    Exam: Constitutional: NAD, anxious Neck: Operative dressing clean dry and intact Respiratory: RA, lungs remain clear without coughing or reports of chest discomfort, no increased work of breathing Cardiovascular: S1-S2, regular pulse without tachycardia.  No peripheral edema.  Normotensive. Abdomen: Soft nontender nondistended with normal active bowel sounds.  LBM 9/7 Neurologic: CN 2-12 grossly intact. Sensation intact, Strength 5/5 x all 4 extremities.  Psychiatric: Normal judgment but does lack insight into his addictive behaviors.  Alert and oriented x 3.  Anxious mood.    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  Patient aware once methadone initiated IV Dilaudid will be discontinued. -Patient  currently on a total of 80 mg of oxycodone daily (I will not include his as needed IV Dilaudid dosing) which is about 30 mg of methadone daily-have asked to see if PMT can assist with least calculating correct dose/confirming this is correct dose -Continue scheduled ibuprofen, Lyrica and baclofen. -Operative culture  positive for Serratia.  ID has discontinued vancomycin and Rocephin as of 9/5 in favor of cefepime  Insomnia/depression -Continue scheduled Ambien HS and Lexapro  Heroin, polysubstance abuse  -Continues to lack insight regarding his substance abuse stating "I do not have an addictive personality".  See above regarding requirement for acute pain medications with eventual transition to methadone. -Patient wishes to follow-up with methadone clinic after discharge -TTE negative for endocarditis -I discussed with [address] pharmacist.  Unfortunately medication reconciliation does not document whether or not patient was on methadone prior to admission.  She plans to speak with patient regarding methadone clinic he previously attended to obtain prior dosing.  Once this is obtained pharmacy will assist with transitioning to equitable dosing with current OxyContin 40 every 12 hours.  Patient is aware that while in hospital this dosage of methadone will be gradually decreased back to lower dose he was on prior to admission.   Covid positive -Asymptomatic -Continue 10-day isolation until 9/11 at 7 PM  Odynophagia -Continue Cepacol spray -Continue elixir medications where appropriate     Data Reviewed: Basic Metabolic Panel: Recent Labs  Lab 06/19/21 0315 06/21/21 0149 06/24/21 0048 06/24/21 0807  NA 133* 138 134* 133*  K 4.6 4.1 5.4* 5.1  CL 102 100 96* 94*  CO2 25 32 29 30  GLUCOSE 170* 119* 99 95  BUN 13 18 26* 27*  CREATININE 0.73 0.82 0.88 0.84  CALCIUM 8.8* 9.6 9.8 9.6   Liver Function Tests: No results for input(s): AST, ALT, ALKPHOS, BILITOT, PROT, ALBUMIN in the last 168 hours.   CBC: Recent Labs  Lab 06/24/21 0048  WBC 12.6*  NEUTROABS 8.9*  HGB 11.9*  HCT 37.5*  MCV 82.2  PLT 362    Scheduled Meds:  baclofen  5 mg Oral QID   docusate sodium  100 mg Oral BID   escitalopram  10 mg Oral Daily   feeding supplement  237 mL Oral BID BM   ibuprofen  400 mg Oral QID    multivitamin with minerals  1 tablet Oral Daily   oxyCODONE  40 mg Oral Q12H   pregabalin  75 mg Oral TID   sodium chloride flush  3 mL Intravenous Q12H   zolpidem  5 mg Oral QHS   Continuous Infusions:  ceFEPime (MAXIPIME) IV 2 g (06/25/21 0531)    Principal Problem:   Epidural abscess Active Problems:   Heroin abuse ([place])   Homeless   COVID-19 virus infection   Consultants: Neurosurgery Infectious disease  Procedures: 2D echocardiogram Cervical discectomy with debridement of epidural abscess and placement of intervertebral lordotic cage  Antibiotics: Meropenem x1 dose 9/1 Vancomycin IV 9/1 through 9/4 Ceftriaxone 9/2 through 9/4 Cefepime 9/5 >>   Time spent: 35 minutes    Erin Hearing ANP  [facility] 7 am - 330 pm/M-F for direct patient care and secure chat Please refer to Amion for contact info 8  days

## 2021-06-25 NOTE — Progress Notes (Signed)
Palliative Medicine RN Note: Rec'd a call from [facility]; they are looking for help with Jonathin's methadone. I checked with our attending MD and our medical director.  Unfortunately, PMT is unable to help, as we provide care for patients with life-limiting illnesses and do not order methadone for patients who are using it to manage addiction. I will cancel the consult to Palliative Care.  Margret Chance Terrianne Cavness, RN, BSN, [facility] Palliative Medicine Team 06/25/2021 1:28 PM Office [phone]

## 2021-06-26 LAB — BASIC METABOLIC PANEL
Anion gap: 8 (ref 5–15)
BUN: 28 mg/dL — ABNORMAL HIGH (ref 6–20)
CO2: 28 mmol/L (ref 22–32)
Calcium: 9.4 mg/dL (ref 8.9–10.3)
Chloride: 98 mmol/L (ref 98–111)
Creatinine, Ser: 0.84 mg/dL (ref 0.61–1.24)
GFR, Estimated: >60 mL/min (ref >60)
Glucose, Bld: 129 mg/dL — ABNORMAL HIGH (ref 70–99)
Potassium: 4 mmol/L (ref 3.5–5.1)
Sodium: 134 mmol/L — ABNORMAL LOW (ref 135–145)

## 2021-06-26 MED ORDER — ZOLPIDEM TARTRATE 5 MG PO TABS
5.0000 mg | ORAL_TABLET | Freq: Every evening | ORAL | Status: DC | PRN
  Administered 2021-06-26 – 2021-07-18 (×19): 5 mg via ORAL
  Filled 2021-06-26 (×20): qty 1

## 2021-06-26 MED ORDER — ENOXAPARIN SODIUM 40 MG/0.4ML IJ SOSY
40.0000 mg | PREFILLED_SYRINGE | INTRAMUSCULAR | Status: DC
  Administered 2021-06-26 – 2021-07-18 (×22): 40 mg via SUBCUTANEOUS
  Filled 2021-06-26 (×23): qty 0.4

## 2021-06-26 NOTE — Plan of Care (Signed)
  Problem: Education: Goal: Knowledge of General Education information will improve Description: Including pain rating scale, medication(s)/side effects and non-pharmacologic comfort measures Outcome: Progressing   Problem: Health Behavior/Discharge Planning: Goal: Ability to manage health-related needs will improve Outcome: Progressing   Problem: Clinical Measurements: Goal: Ability to maintain clinical measurements within normal limits will improve Outcome: Progressing Goal: Will remain free from infection Outcome: Progressing Goal: Diagnostic test results will improve Outcome: Progressing Goal: Cardiovascular complication will be avoided Outcome: Progressing   Problem: Activity: Goal: Risk for activity intolerance will decrease Outcome: Progressing   Problem: Nutrition: Goal: Adequate nutrition will be maintained Outcome: Progressing   Problem: Coping: Goal: Level of anxiety will decrease Outcome: Progressing   Problem: Elimination: Goal: Will not experience complications related to bowel motility Outcome: Progressing Goal: Will not experience complications related to urinary retention Outcome: Progressing   Problem: Pain Managment: Goal: General experience of comfort will improve Outcome: Progressing   Problem: Safety: Goal: Ability to remain free from injury will improve Outcome: Progressing   

## 2021-06-26 NOTE — Plan of Care (Signed)

## 2021-06-26 NOTE — Progress Notes (Signed)
Page sent to [facility] Neurosurgery team regarding concern for surgical site. Site is red and warm to touch with swelling, no drainage. Vitals are WNL. Call back requested.

## 2021-06-26 NOTE — Progress Notes (Signed)
PROGRESS NOTE    Gerald Oliver  MVH:[id number] DOB: 07-22-1991 DOA: 06/17/2021 PCP: Patient, No Pcp Per (Inactive)   Brief Narrative: 30 past medical history significant for IV drug use, prior heroin overdose May 2022 presented to hospital with upper back pain.  He uses heroin regularly, last used 2 days prior to admission.  On admission he was noted to be incidentally COVID-positive.  MRI on admission showed an epidural abscess.  Neurosurgery consulted.  Patient is status post discectomy, thecal sac decompression and epidural abscess debridement, also received intervertebral biomechanical lordotic cage as well as an anterior instrumentation with interbody plate per neurosurgery on 06/18/2021.  Operative culture positive for Serratia.  Antibiotics were changed to cefepime by ID.  Patient need 6 weeks of IV antibiotics.    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 Needs 6 weeks treatment.  Nurse will ask Nsx to follow up on wound.   2-Insomnia/depression: Continue with lexapro. Change Ambien to PRN>   3-Heroine polysubstance abuse: Explain to patient we don't provide prescription for Methadone. HE will need to follow up with methadone clinic,, TOC/Allison can try to arrange that.   4-COVID-positive: Will complete Isolation 9/11  Odynophagia: improved     Nutrition Problem: Increased nutrient needs Etiology: acute illness (epidural abscess)    Signs/Symptoms: estimated needs    Interventions: Ensure Enlive (each supplement provides 350kcal and 20 grams of protein), Magic cup, MVI  Estimated body mass index is 20.54 kg/m as calculated from the following:   Height as of this encounter: 5\' 9"  (1.753 m).   Weight as of this encounter: 63.1 kg.   DVT prophylaxis: start Lovenox 9/10,  discussed with neurosurgery.  Code Status: Full code Family Communication: care discussed with patient. Disposition Plan:  Status is: Inpatient  Remains inpatient appropriate because:IV treatments appropriate due to intensity of illness or inability to take PO  Dispo: The patient is from: Home              Anticipated d/c is to: Home              Patient currently is not medically stable to d/c. Needs to complete 6 weeks IV antibiotics.    Difficult to place patient No       Subjective: He is laert conversant. Complaints of back pain. Worry about anterior incision neck pain full.   Objective: Vitals:   06/25/21 2126 06/26/21 0000 06/26/21 0327 06/26/21 0330  BP: 131/88 116/68 131/75   Pulse: 78 78 88   Resp: 20 18 16    Temp: 97.8 F (36.6 C) 98.3 F (36.8 C) 98.6 F (37 C)   TempSrc: Oral Oral Oral   SpO2: 97% 98% 99%   Weight:    63.1 kg  Height:        Intake/Output Summary (Last 24 hours) at 06/26/2021 0754 Last data filed at 06/26/2021 0735 Gross per 24 hour  Intake 680 ml  Output 1950 ml  Net -1270 ml   Filed Weights   06/24/21 0500 06/25/21 0427 06/26/21 0330  Weight: 63 kg 62.5 kg 63.1 kg    Examination:  General exam: Appears calm and comfortable  Respiratory system: Clear to auscultation. Respiratory effort normal. Cardiovascular system: S1 & S2 heard, RRR. No JVD, murmurs, rubs, gallops or clicks. No pedal edema. Gastrointestinal system:  Abdomen is nondistended, soft and nontender. No organomegaly or masses felt. Normal bowel sounds heard. Central nervous system: Alert and oriented.   Data Reviewed: I have personally reviewed following labs and imaging studies  CBC: Recent Labs  Lab 06/24/21 0048  WBC 12.6*  NEUTROABS 8.9*  HGB 11.9*  HCT 37.5*  MCV 82.2  PLT 362   Basic Metabolic Panel: Recent Labs  Lab 06/21/21 0149 06/24/21 0048 06/24/21 0807  NA 138 134* 133*  K 4.1 5.4* 5.1  CL 100 96* 94*  CO2 32 29 30  GLUCOSE 119* 99 95   BUN 18 26* 27*  CREATININE 0.82 0.88 0.84  CALCIUM 9.6 9.8 9.6   GFR: Estimated Creatinine Clearance: 112.7 mL/min (by C-G formula based on SCr of 0.84 mg/dL). Liver Function Tests: No results for input(s): AST, ALT, ALKPHOS, BILITOT, PROT, ALBUMIN in the last 168 hours. No results for input(s): LIPASE, AMYLASE in the last 168 hours. No results for input(s): AMMONIA in the last 168 hours. Coagulation Profile: No results for input(s): INR, PROTIME in the last 168 hours. Cardiac Enzymes: No results for input(s): CKTOTAL, CKMB, CKMBINDEX, TROPONINI in the last 168 hours. BNP (last 3 results) No results for input(s): PROBNP in the last 8760 hours. HbA1C: No results for input(s): HGBA1C in the last 72 hours. CBG: No results for input(s): GLUCAP in the last 168 hours. Lipid Profile: No results for input(s): CHOL, HDL, LDLCALC, TRIG, CHOLHDL, LDLDIRECT in the last 72 hours. Thyroid Function Tests: No results for input(s): TSH, T4TOTAL, FREET4, T3FREE, THYROIDAB in the last 72 hours. Anemia Panel: No results for input(s): VITAMINB12, FOLATE, FERRITIN, TIBC, IRON, RETICCTPCT in the last 72 hours. Sepsis Labs: No results for input(s): PROCALCITON, LATICACIDVEN in the last 168 hours.  Recent Results (from the past 240 hour(s))  Culture, blood (routine x 2)     Status: None   Collection Time: 06/17/21  2:03 PM   Specimen: BLOOD LEFT HAND  Result Value Ref Range Status   Specimen Description BLOOD LEFT HAND  Final   Special Requests   Final    BOTTLES DRAWN AEROBIC AND ANAEROBIC Blood Culture adequate volume   Culture   Final    NO GROWTH 5 DAYS Performed at [facility], [address] [place], [place] [address]    Report Status 06/22/2021 FINAL  Final  Resp Panel by RT-PCR (Flu A&B, Covid) Nasopharyngeal Swab     Status: Abnormal   Collection Time: 06/17/21  6:24 PM   Specimen: Nasopharyngeal Swab; Nasopharyngeal(NP) swabs in vial transport medium  Result Value Ref Range  Status   SARS Coronavirus 2 by RT PCR POSITIVE (A) NEGATIVE Final    Comment: RESULT CALLED TO, READ BACK BY AND VERIFIED WITH: GRACE TATE RN 06/17/21 @2032  BY JW (NOTE) SARS-CoV-2 target nucleic acids are DETECTED.  The SARS-CoV-2 RNA is generally detectable in upper respiratory specimens during the acute phase of infection. Positive results are indicative of the presence of the identified virus, but do not rule out bacterial infection or co-infection with other pathogens not detected by the test. Clinical correlation with patient history and other diagnostic information is necessary to determine patient infection status. The expected result is Negative.  Fact Sheet for Patients: BloggerCourse.comhttps://www.fda.gov/media/152166/download  Fact Sheet for Healthcare Providers: SeriousBroker.ithttps://www.fda.gov/media/152162/download  This test is not yet approved or cleared by the [place] FDA and  has been authorized for detection and/or diagnosis of SARS-CoV-2 by FDA under an Emergency Use Authorization (EUA).  This EUA will  remain in effect (meaning this test can be  used) for the duration of  the COVID-19 declaration under Section 564(b)(1) of the Act, 21 U.S.C. section 360bbb-3(b)(1), unless the authorization is terminated or revoked sooner.     Influenza A by PCR NEGATIVE NEGATIVE Final   Influenza B by PCR NEGATIVE NEGATIVE Final    Comment: (NOTE) The Xpert Xpress SARS-CoV-2/FLU/RSV plus assay is intended as an aid in the diagnosis of influenza from Nasopharyngeal swab specimens and should not be used as a sole basis for treatment. Nasal washings and aspirates are unacceptable for Xpert Xpress SARS-CoV-2/FLU/RSV testing.  Fact Sheet for Patients: BloggerCourse.com  Fact Sheet for Healthcare Providers: SeriousBroker.it  This test is not yet approved or cleared by the [place] FDA and has been authorized for detection and/or diagnosis of  SARS-CoV-2 by FDA under an Emergency Use Authorization (EUA). This EUA will remain in effect (meaning this test can be used) for the duration of the COVID-19 declaration under Section 564(b)(1) of the Act, 21 U.S.C. section 360bbb-3(b)(1), unless the authorization is terminated or revoked.  Performed at [facility], [address], [place], [place] [address]   Culture, blood (Routine X 2) w Reflex to ID Panel     Status: None   Collection Time: 06/18/21 12:06 AM   Specimen: BLOOD  Result Value Ref Range Status   Specimen Description BLOOD SITE NOT SPECIFIED  Final   Special Requests   Final    BOTTLES DRAWN AEROBIC AND ANAEROBIC Blood Culture adequate volume   Culture   Final    NO GROWTH 5 DAYS Performed at [facility], [address] [place], [place] [address]    Report Status 06/23/2021 FINAL  Final  Culture, blood (routine x 2)     Status: None   Collection Time: 06/18/21  9:26 AM   Specimen: BLOOD RIGHT HAND  Result Value Ref Range Status   Specimen Description BLOOD RIGHT HAND  Final   Special Requests   Final    BOTTLES DRAWN AEROBIC AND ANAEROBIC Blood Culture adequate volume   Culture   Final    NO GROWTH 5 DAYS Performed at [facility], [address], [place], [place] [address]    Report Status 06/23/2021 FINAL  Final  Culture, blood (routine x 2)     Status: None   Collection Time: 06/18/21  9:30 AM   Specimen: BLOOD RIGHT WRIST  Result Value Ref Range Status   Specimen Description BLOOD RIGHT WRIST  Final   Special Requests   Final    BOTTLES DRAWN AEROBIC ONLY Blood Culture results may not be optimal due to an inadequate volume of blood received in culture bottles   Culture   Final    NO GROWTH 5 DAYS Performed at [facility], [address], [place], [place] [address]    Report Status 06/23/2021 FINAL  Final  Aerobic/Anaerobic Culture w Gram Stain (surgical/deep wound)     Status: None   Collection Time: 06/18/21  5:48 PM    Specimen: Abscess; Wound  Result Value Ref Range Status   Specimen Description ABSCESS  Final   Special Requests EPIDURAL  Final   Gram Stain   Final    FEW WBC PRESENT,BOTH PMN AND MONONUCLEAR NO ORGANISMS SEEN    Culture   Final    FEW SERRATIA MARCESCENS SUSCEPTIBILITIES PERFORMED ON PREVIOUS CULTURE WITHIN THE LAST 5 DAYS. NO ANAEROBES ISOLATED Performed at [facility],  [address] [place], [place] [address]    Report Status 06/24/2021 FINAL  Final  Aerobic/Anaerobic Culture w Gram Stain (surgical/deep wound)     Status: None   Collection Time: 06/18/21  5:53 PM   Specimen: Tissue  Result Value Ref Range Status   Specimen Description TISSUE  Final   Special Requests EPIDURAL  Final   Gram Stain   Final    MODERATE WBC PRESENT,BOTH PMN AND MONONUCLEAR RARE GRAM NEGATIVE RODS    Culture   Final    ABUNDANT SERRATIA MARCESCENS NO ANAEROBES ISOLATED Performed at [facility], [address] [place], [place] [address]    Report Status 06/24/2021 FINAL  Final   Organism ID, Bacteria SERRATIA MARCESCENS  Final      Susceptibility   Serratia marcescens - MIC*    CEFAZOLIN >=64 RESISTANT Resistant     CEFEPIME <=0.12 SENSITIVE Sensitive     CEFTAZIDIME <=1 SENSITIVE Sensitive     CEFTRIAXONE <=0.25 SENSITIVE Sensitive     CIPROFLOXACIN <=0.25 SENSITIVE Sensitive     GENTAMICIN <=1 SENSITIVE Sensitive     TRIMETH/SULFA <=20 SENSITIVE Sensitive     * ABUNDANT SERRATIA MARCESCENS         Radiology Studies: No results found.      Scheduled Meds:  baclofen  5 mg Oral QID   docusate sodium  100 mg Oral BID   escitalopram  10 mg Oral Daily   feeding supplement  237 mL Oral BID BM   ibuprofen  400 mg Oral QID   multivitamin with minerals  1 tablet Oral Daily   oxyCODONE  40 mg Oral Q12H   pregabalin  75 mg Oral TID   sodium chloride flush  3 mL Intravenous Q12H   zolpidem  5 mg Oral QHS   Continuous Infusions:  ceFEPime (MAXIPIME) IV 2 g  (06/26/21 0635)     LOS: 9 days    Time spent: 35 minutes.     Alba Cory, MD [facility]   If 7PM-7AM, please contact night-coverage [email or web address]  06/26/2021, 7:54 AM

## 2021-06-26 NOTE — Progress Notes (Signed)
Called to evaluate pt's wound, he is s/p C5-6 ACDF for epidural abscess 9/2, Cx grew Serratia, now POD9. His swallowing was worse post-op then improved, as expected, has not worsened again. No SOB/CP/dysphagia/stridor, no weakness or numbness. Inferior aspect of his incision is red and erythematous, only the inferior aspect. It looks more c/w cellulitis of the adjacent skin but it is swollen and tender.  -We should get a CT neck to evaluate for a worsening fluid collection. If reassuring, can follow the wound clinically since he's already on antibiotics. Will order the CT

## 2021-06-27 ENCOUNTER — Inpatient Hospital Stay (HOSPITAL_COMMUNITY): Payer: Self-pay

## 2021-06-27 MED ORDER — IOHEXOL 350 MG/ML SOLN
75.0000 mL | Freq: Once | INTRAVENOUS | Status: AC | PRN
  Administered 2021-06-27: 75 mL via INTRAVENOUS

## 2021-06-27 NOTE — Progress Notes (Signed)
Patient refusing to wear SCD's, pt educated on their importance. MD made aware.

## 2021-06-27 NOTE — Plan of Care (Signed)

## 2021-06-27 NOTE — Progress Notes (Addendum)
PROGRESS NOTE    Gerald Oliver  MVE:[id number] DOB: 06-Feb-1991 DOA: 06/17/2021 PCP: Patient, No Pcp Per (Inactive)   Brief Narrative: 30 past medical history significant for IV drug use, prior heroin overdose May 2022 presented to hospital with upper back pain.  He uses heroin regularly, last used 2 days prior to admission.  On admission he was noted to be incidentally COVID-positive.  MRI on admission showed an epidural abscess.  Neurosurgery consulted.  Patient is status post discectomy, thecal sac decompression and epidural abscess debridement, also received intervertebral biomechanical lordotic cage as well as an anterior instrumentation with interbody plate per neurosurgery on 06/18/2021.  Operative culture positive for Serratia.  Antibiotics were changed to cefepime by ID.  Patient need 6 weeks of IV antibiotics.    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 Needs 6 weeks treatment.  Appreciate Neurosurgery, plan to check CT neck to follow up on neck wound.   2-Insomnia/depression: Continue with lexapro. Change Ambien to PRN>   3-Heroine polysubstance abuse: Explain to patient we don't provide prescription for Methadone. He  will need to follow up with methadone clinic,, TOC/Allison can try to arrange that.   4-COVID-positive: Completed  Isolation 9/11  Odynophagia: improved     Nutrition Problem: Increased nutrient needs Etiology: acute illness (epidural abscess)    Signs/Symptoms: estimated needs    Interventions: Ensure Enlive (each supplement provides 350kcal and 20 grams of protein), Magic cup, MVI  Estimated body mass index is 20.54 kg/m as calculated from the following:   Height as of this encounter: 5\' 9"  (1.753 m).   Weight as of this encounter: 63.1 kg.   DVT  prophylaxis: start Lovenox 9/10, discussed with neurosurgery.  Code Status: Full code Family Communication: care discussed with patient. Disposition Plan:  Status is: Inpatient  Remains inpatient appropriate because:IV treatments appropriate due to intensity of illness or inability to take PO  Dispo: The patient is from: Home              Anticipated d/c is to: Home              Patient currently is not medically stable to d/c. Needs to complete 6 weeks IV antibiotics.    Difficult to place patient No       Subjective: He is feeling ok, no new complaints.   Objective: Vitals:   06/26/21 2219 06/27/21 0543 06/27/21 0807 06/27/21 1300  BP: 124/76 122/70 120/71 107/62  Pulse: 69  64 70  Resp: 14  13 15   Temp: 98.4 F (36.9 C) 97.7 F (36.5 C) 98 F (36.7 C) 98 F (36.7 C)  TempSrc: Oral Oral Oral Oral  SpO2: 95%  95% 95%  Weight:      Height:        Intake/Output Summary (Last 24 hours) at 06/27/2021 1735 Last data filed at 06/27/2021 1702 Gross per 24 hour  Intake 200 ml  Output 1900 ml  Net -1700 ml    Filed Weights   06/24/21 0500 06/25/21 0427 06/26/21 0330  Weight: 63 kg 62.5 kg 63.1 kg    Examination:  General exam: NAD Respiratory system: CTA Cardiovascular system: S 1, S 2 RRR Gastrointestinal system: BS present, soft, nt Central nervous system: Alert, and oriented.    Data Reviewed: I have personally reviewed  following labs and imaging studies  CBC: Recent Labs  Lab 06/24/21 0048  WBC 12.6*  NEUTROABS 8.9*  HGB 11.9*  HCT 37.5*  MCV 82.2  PLT 362    Basic Metabolic Panel: Recent Labs  Lab 06/21/21 0149 06/24/21 0048 06/24/21 0807 06/26/21 0847  NA 138 134* 133* 134*  K 4.1 5.4* 5.1 4.0  CL 100 96* 94* 98  CO2 32 29 30 28   GLUCOSE 119* 99 95 129*  BUN 18 26* 27* 28*  CREATININE 0.82 0.88 0.84 0.84  CALCIUM 9.6 9.8 9.6 9.4    GFR: Estimated Creatinine Clearance: 112.7 mL/min (by C-G formula based on SCr of 0.84  mg/dL). Liver Function Tests: No results for input(s): AST, ALT, ALKPHOS, BILITOT, PROT, ALBUMIN in the last 168 hours. No results for input(s): LIPASE, AMYLASE in the last 168 hours. No results for input(s): AMMONIA in the last 168 hours. Coagulation Profile: No results for input(s): INR, PROTIME in the last 168 hours. Cardiac Enzymes: No results for input(s): CKTOTAL, CKMB, CKMBINDEX, TROPONINI in the last 168 hours. BNP (last 3 results) No results for input(s): PROBNP in the last 8760 hours. HbA1C: No results for input(s): HGBA1C in the last 72 hours. CBG: No results for input(s): GLUCAP in the last 168 hours. Lipid Profile: No results for input(s): CHOL, HDL, LDLCALC, TRIG, CHOLHDL, LDLDIRECT in the last 72 hours. Thyroid Function Tests: No results for input(s): TSH, T4TOTAL, FREET4, T3FREE, THYROIDAB in the last 72 hours. Anemia Panel: No results for input(s): VITAMINB12, FOLATE, FERRITIN, TIBC, IRON, RETICCTPCT in the last 72 hours. Sepsis Labs: No results for input(s): PROCALCITON, LATICACIDVEN in the last 168 hours.  Recent Results (from the past 240 hour(s))  Resp Panel by RT-PCR (Flu A&B, Covid) Nasopharyngeal Swab     Status: Abnormal   Collection Time: 06/17/21  6:24 PM   Specimen: Nasopharyngeal Swab; Nasopharyngeal(NP) swabs in vial transport medium  Result Value Ref Range Status   SARS Coronavirus 2 by RT PCR POSITIVE (A) NEGATIVE Final    Comment: RESULT CALLED TO, READ BACK BY AND VERIFIED WITH: GRACE TATE RN 06/17/21 @2032  BY JW (NOTE) SARS-CoV-2 target nucleic acids are DETECTED.  The SARS-CoV-2 RNA is generally detectable in upper respiratory specimens during the acute phase of infection. Positive results are indicative of the presence of the identified virus, but do not rule out bacterial infection or co-infection with other pathogens not detected by the test. Clinical correlation with patient history and other diagnostic information is necessary to  determine patient infection status. The expected result is Negative.  Fact Sheet for Patients: 08/17/21  Fact Sheet for Healthcare Providers:  This test is not yet approved or cleared by the [place] FDA and  has been authorized for detection and/or diagnosis of SARS-CoV-2 by FDA under an Emergency Use Authorization (EUA).  This EUA will remain in effect (meaning this test can be  used) for the duration of  the COVID-19 declaration under Section 564(b)(1) of the Act, 21 U.S.C. section 360bbb-3(b)(1), unless the authorization is terminated or revoked sooner.     Influenza A by PCR NEGATIVE NEGATIVE Final   Influenza B by PCR NEGATIVE NEGATIVE Final    Comment: (NOTE) The Xpert Xpress SARS-CoV-2/FLU/RSV plus assay is intended as an aid in the diagnosis of influenza from Nasopharyngeal swab specimens and should not be used as a sole basis for treatment. Nasal washings and aspirates are unacceptable for Xpert Xpress SARS-CoV-2/FLU/RSV testing.  Fact Sheet for Patients: SeriousBroker.it  Fact Sheet  for Healthcare Providers: SeriousBroker.it  This test is not yet approved or cleared by the [place] and has been authorized for detection and/or diagnosis of SARS-CoV-2 by FDA under an Emergency Use Authorization (EUA). This EUA will remain in effect (meaning this test can be used) for the duration of the COVID-19 declaration under Section 564(b)(1) of the Act, 21 U.S.C. section 360bbb-3(b)(1), unless the authorization is terminated or revoked.  Performed at [facility], [address], [place], [place] [address]   Culture, blood (Routine X 2) w Reflex to ID Panel     Status: None   Collection Time: 06/18/21 12:06 AM   Specimen: BLOOD  Result Value Ref Range Status   Specimen Description BLOOD SITE NOT SPECIFIED  Final   Special  Requests   Final    BOTTLES DRAWN AEROBIC AND ANAEROBIC Blood Culture adequate volume   Culture   Final    NO GROWTH 5 DAYS Performed at [facility], [address], [place], [place] [address]    Report Status 06/23/2021 FINAL  Final  Culture, blood (routine x 2)     Status: None   Collection Time: 06/18/21  9:26 AM   Specimen: BLOOD RIGHT HAND  Result Value Ref Range Status   Specimen Description BLOOD RIGHT HAND  Final   Special Requests   Final    BOTTLES DRAWN AEROBIC AND ANAEROBIC Blood Culture adequate volume   Culture   Final    NO GROWTH 5 DAYS Performed at [facility], [address], [place], [place] [address]    Report Status 06/23/2021 FINAL  Final  Culture, blood (routine x 2)     Status: None   Collection Time: 06/18/21  9:30 AM   Specimen: BLOOD RIGHT WRIST  Result Value Ref Range Status   Specimen Description BLOOD RIGHT WRIST  Final   Special Requests   Final    BOTTLES DRAWN AEROBIC ONLY Blood Culture results may not be optimal due to an inadequate volume of blood received in culture bottles   Culture   Final    NO GROWTH 5 DAYS Performed at [facility], [address] [place], [place] [address]    Report Status 06/23/2021 FINAL  Final  Aerobic/Anaerobic Culture w Gram Stain (surgical/deep wound)     Status: None   Collection Time: 06/18/21  5:48 PM   Specimen: Abscess; Wound  Result Value Ref Range Status   Specimen Description ABSCESS  Final   Special Requests EPIDURAL  Final   Gram Stain   Final    FEW WBC PRESENT,BOTH PMN AND MONONUCLEAR NO ORGANISMS SEEN    Culture   Final    FEW SERRATIA MARCESCENS SUSCEPTIBILITIES PERFORMED ON PREVIOUS CULTURE WITHIN THE LAST 5 DAYS. NO ANAEROBES ISOLATED Performed at [facility], [address] [place], [place] [address]    Report Status 06/24/2021 FINAL  Final  Aerobic/Anaerobic Culture w Gram Stain (surgical/deep wound)     Status: None   Collection Time: 06/18/21  5:53 PM    Specimen: Tissue  Result Value Ref Range Status   Specimen Description TISSUE  Final   Special Requests EPIDURAL  Final   Gram Stain   Final    MODERATE WBC PRESENT,BOTH PMN AND MONONUCLEAR RARE GRAM NEGATIVE RODS    Culture   Final    ABUNDANT SERRATIA MARCESCENS NO ANAEROBES ISOLATED Performed at [facility], [address] [place], [place] [address]  Report Status 06/24/2021 FINAL  Final   Organism ID, Bacteria SERRATIA MARCESCENS  Final      Susceptibility   Serratia marcescens - MIC*    CEFAZOLIN >=64 RESISTANT Resistant     CEFEPIME <=0.12 SENSITIVE Sensitive     CEFTAZIDIME <=1 SENSITIVE Sensitive     CEFTRIAXONE <=0.25 SENSITIVE Sensitive     CIPROFLOXACIN <=0.25 SENSITIVE Sensitive     GENTAMICIN <=1 SENSITIVE Sensitive     TRIMETH/SULFA <=20 SENSITIVE Sensitive     * ABUNDANT SERRATIA MARCESCENS          Radiology Studies: No results found.      Scheduled Meds:  baclofen  5 mg Oral QID   docusate sodium  100 mg Oral BID   enoxaparin (LOVENOX) injection  40 mg Subcutaneous Q24H   escitalopram  10 mg Oral Daily   feeding supplement  237 mL Oral BID BM   ibuprofen  400 mg Oral QID   multivitamin with minerals  1 tablet Oral Daily   oxyCODONE  40 mg Oral Q12H   pregabalin  75 mg Oral TID   sodium chloride flush  3 mL Intravenous Q12H   Continuous Infusions:  ceFEPime (MAXIPIME) IV 2 g (06/27/21 1705)     LOS: 10 days    Time spent: 35 minutes.     Alba CoryBelkys A Chantry Headen, MD [facility]   If 7PM-7AM, please contact night-coverage [email or web address]  06/27/2021, 5:35 PM

## 2021-06-27 NOTE — Progress Notes (Signed)
Patient tolerating po medications well. He maintains a regular diet while eating and drinking frequently throughout the night,.

## 2021-06-28 DIAGNOSIS — U071 COVID-19: Secondary | ICD-10-CM

## 2021-06-28 NOTE — Progress Notes (Signed)
[facility]  Date of Admission:  06/17/2021           Reason for visit: Follow up on cervical epidural abscess  Current antibiotics: Cefepime 9/5--pres   ASSESSMENT:    30 y.o. male admitted with:  Cervical epidural abscess: Status post C5-C6 ACDF (instrumented fusion) on 06/18/2021 with neurosurgery.  OR cultures grew Serratia marcescens.  Blood cultures were negative.  He has been on cefepime.  He had a repeat CT scan of the neck 06/27/2021 due to some erythema along the inferior aspect of his incision that showed a superficial 4.1 x 0.4 x 3.6 cm fluid collection along the skin surface on the right with peripheral enhancement suspicious for abscess.  Reviewed today by neurosurgery who would not elect for surgical exploration unless he develops dehiscence, purulent drainage, or significant dysphagia. Injection drug use: Negative HCV RNA and HIV testing COVID-19: Asymptomatic and now off precautions  RECOMMENDATIONS:    Continue cefepime 2 g every 8 hours per pharmacy recommendations Continue monitoring lab work with CBC and BMP weekly, ESR and CRP every 2 weeks Monitor incision site for any progression of fluid collection Will follow    Principal Problem:   Epidural abscess Active Problems:   Heroin abuse ([place])   Homeless   COVID-19 virus infection    MEDICATIONS:    Scheduled Meds:  baclofen  5 mg Oral QID   docusate sodium  100 mg Oral BID   enoxaparin (LOVENOX) injection  40 mg Subcutaneous Q24H   escitalopram  10 mg Oral Daily   feeding supplement  237 mL Oral BID BM   ibuprofen  400 mg Oral QID   multivitamin with minerals  1 tablet Oral Daily   oxyCODONE  40 mg Oral Q12H   pregabalin  75 mg Oral TID   sodium chloride flush  3 mL Intravenous Q12H   Continuous Infusions:  ceFEPime (MAXIPIME) IV 2 g (06/28/21 0626)   PRN Meds:.acetaminophen **OR** acetaminophen, bisacodyl, hydrALAZINE, HYDROmorphone (DILAUDID) injection,  ondansetron **OR** ondansetron (ZOFRAN) IV, phenol, polyethylene glycol, zolpidem  SUBJECTIVE:   24 hour events:  CT neck obtained overnight showing fluid collection.  Reviewed by neurosurgery. Afebrile No new labs this morning   Patient reports no drainage from his incision.  He has not noticed any new or worsening warmth erythema.  No dysphagia.  His pain is unchanged.  Review of Systems  All other systems reviewed and are negative.    OBJECTIVE:   Blood pressure 126/70, pulse 75, temperature 97.7 F (36.5 C), temperature source Oral, resp. rate 16, height '5\' 9"'  (1.753 m), weight 63.1 kg, SpO2 97 %. Body mass index is 20.54 kg/m.  Physical Exam Constitutional:      General: He is not in acute distress.    Appearance: Normal appearance.  HENT:     Head: Normocephalic and atraumatic.  Eyes:     Extraocular Movements: Extraocular movements intact.     Conjunctiva/sclera: Conjunctivae normal.  Neck:     Comments: Cervical incision intact.  No drainage.  Inferior to the incision is some increased swelling with mild erythema.  No tenderness. Pulmonary:     Effort: Pulmonary effort is normal. No respiratory distress.  Skin:    General: Skin is warm and dry.     Findings: No rash.  Neurological:     General: No focal deficit present.     Mental Status: He is alert and oriented to person, place, and time.  Psychiatric:        Mood and Affect: Mood normal.        Behavior: Behavior normal.     Lab Results: Lab Results  Component Value Date   WBC 12.6 (H) 06/24/2021   HGB 11.9 (L) 06/24/2021   HCT 37.5 (L) 06/24/2021   MCV 82.2 06/24/2021   PLT 362 06/24/2021    Lab Results  Component Value Date   NA 134 (L) 06/26/2021   K 4.0 06/26/2021   CO2 28 06/26/2021   GLUCOSE 129 (H) 06/26/2021   BUN 28 (H) 06/26/2021   CREATININE 0.84 06/26/2021   CALCIUM 9.4 06/26/2021   GFRNONAA >60 06/26/2021   GFRAA >60 05/09/2018    Lab Results  Component Value Date   ALT 9  06/17/2021   AST 13 (L) 06/17/2021   ALKPHOS 83 06/17/2021   BILITOT 0.8 06/17/2021       Component Value Date/Time   CRP 2.9 (H) 06/24/2021 0048       Component Value Date/Time   ESRSEDRATE 64 (H) 06/24/2021 0048     I have reviewed the micro and lab results in Epic.  Imaging: CT SOFT TISSUE NECK W CONTRAST  Result Date: 06/27/2021 CLINICAL DATA:  Neck abscess, deep tissue epidural abscess s/p drainage with swelling, eval for abscess EXAM: CT NECK WITH CONTRAST TECHNIQUE: Multidetector CT imaging of the neck was performed using the standard protocol following the bolus administration of intravenous contrast. CONTRAST:  91m OMNIPAQUE IOHEXOL 350 MG/ML SOLN COMPARISON:  None. FINDINGS: Pharynx and larynx: Normal. No mass or swelling. Salivary glands: No inflammation, mass, or stone. Thyroid: Normal. Lymph nodes: None enlarged or abnormal density. Vascular: Negative. Limited intracranial: Negative. Visualized orbits: Negative. Mastoids and visualized paranasal sinuses: Clear. Skeleton: At the C5-C6 level there is a superficial oval 4.1 x 0.4 x 3.6 Cm fluid collection along the skin surface on the right with peripheral enhancement/capsule and internal gas, suspicious for abscess. This collection is contiguous with more ill-defined fluid that tracks posteriorly in the right neck between the thyroid and common carotid artery to the site of C5-C6 ACDF with mild/ill-defined and incomplete peripheral enhancement. This fluid collection extends superiorly along the ventral aspect of the strap musculature to the underside of the submandibular gland (see series 3, image 61. There is small volume prevertebral edema extending from approximately C2-C7. Upper chest: Visualized lung apices are clear. IMPRESSION: 1. At the C5-C6 level there is a superficial oval 4.1 x 0.4 x 3.6 cm fluid collection along the skin surface on the right with peripheral enhancement/capsule and internal gas, suspicious for abscess  given the clinical concern for such. This collection is contiguous with more ill-defined fluid that tracks posteriorly in the right neck to the site of C5-C6 ACDF with mild/ill-defined and incomplete peripheral enhancement, nonspecific but potentially early abscess formation and/or postoperative. This fluid collection extends superiorly along the ventral aspect of the strap musculature to the underside of the submandibular gland. 2. Small volume prevertebral edema extending from approximately C2-C7, which could be postoperative or infectious in etiology. An MRI of the cervical spine with contrast could better evaluate the spine/canal if clinically indicated. Findings discussed with BReinaldo Meeker NP via telephone at 6:05 p.m. Electronically Signed   By: FMargaretha SheffieldM.D.   On: 06/27/2021 18:08     Imaging independently reviewed in Epic.    ARaynelle Highlandfor Infectious Disease [place]rem[phone] 06/28/2021, 9:02 AM  I spent greater than 35 minutes  with the patient including greater than 50% of time in face to face counsel of the patient and in coordination of their care.

## 2021-06-28 NOTE — Progress Notes (Addendum)
[facility] PROGRESS NOTE  Gerald Oliver TKZ:[id number] DOB: 16-Jun-1991 DOA: 06/17/2021 PCP: Patient, No Pcp Per (Inactive)  Status: Remains inpatient appropriate because:Unsafe d/c plan, IV treatments appropriate due to intensity of illness or inability to take PO, and Inpatient level of care appropriate due to severity of illness  Dispo: The patient is from:  Homeless              Anticipated d/c is to:  Homeless-back to streets per his request              Patient currently is not medically stable to d/c.   Difficult to place patient No    Level of care: Telemetry Medical  Code Status: Full Family Communication: Patient only DVT prophylaxis: SCDs COVID vaccination status: Found to have incidental COVID this admission-asymptomatic-apparently was not immunized prior to admission   HPI: 30 year old male with history of IV drug use, prior heroin overdose in May 2022 comes into the hospital with upper back pain.  This is been going on for the last several days progressively getting worse.  He also reports subjective fevers.  He also smokes tobacco, marijuana and occasionally drinks EtOH.  He uses heroin regularly, last used 2 days prior to admission.  Of note, his brother is also hospitalized with IV drug use related complications.  On admission, he was noted to be incidentally COVID-positive.  MRI on admission showed an epidural abscess, neurosurgery consulted  Subjective: Alert.  Patient says pain is controlled with  regimen  Objective: Vitals:   06/28/21 0006 06/28/21 0819  BP: (!) 118/56 126/70  Pulse: 66 75  Resp: 18 16  Temp: 98.1 F (36.7 C) 97.7 F (36.5 C)  SpO2: 99% 97%    Intake/Output Summary (Last 24 hours) at 06/28/2021 0823 Last data filed at 06/27/2021 2330 Gross per 24 hour  Intake 790 ml  Output 500 ml  Net 290 ml   Filed Weights   06/24/21 0500 06/25/21 0427 06/26/21 0330  Weight: 63 kg 62.5 kg 63.1 kg    Exam: Constitutional: NAD,  anxious Neck: Operative dressing been removed.  There is a large fluctuant area beneath the incision that is not erythematous but is somewhat tender to palpation. Respiratory: RA, lungs clear to auscultation without any increased work of breathing.  Normal pulse oximetry readings Cardiovascular: S1-S2, regular pulse without tachycardia.  No peripheral edema.  Normotensive. Abdomen: Soft nontender nondistended with normal active bowel sounds.  LBM 9/10 Neurologic: CN 2-12 grossly intact. Sensation intact, Strength 5/5 x all 4 extremities.  Psychiatric: Normal judgment but does lack insight into his addictive behaviors.  Alert and oriented x 3.  Anxious mood.    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  Patient aware once methadone initiated IV Dilaudid will be discontinued. -After an extensive conversation with my supervising physician through the state of [place] Dr. Loleta Books we decided that since this patient was not on methadone prior to coming in that given hospital policy  as well as [facility] narcotics policy that it is not appropriate to institute methadone this admission.  Plan is to slowly taper the long-acting narcotics with discharge on short acting narcotics for a brief period.  Patient will be made aware that he needs to schedule a follow-up with the methadone  clinic as soon as possible after discharge so he can transition back to his preferred methadone.  I will discuss this in detail with the patient tomorrow on 9/13. -Continue scheduled ibuprofen, Lyrica and baclofen. -Operative culture positive for Serratia.  ID has discontinued vancomycin and Rocephin as of 9/5 in favor of cefepime  Superficial abscess versus seroma C5/C6 -Patient has been having ongoing neck pain postoperatively Significant adjustments and pain medication -Dressing to surgical site removed on 9/9 with notable erythema at distal aspect of incision. -Patient was evaluated by neurosurgery with soft tissue CT neck ordered.  This did reveal a superficial oval 4.1 x 0.4 x 3.6 cm fluid collection along the skin surface on the right suspicious for abscess.   -9/12 neurosurgery evaluated and did not think surgical intervention necessary at this time-recommendation is to continue current antibiotic -ID following as well  Insomnia/depression -Continue scheduled Ambien HS and Lexapro  Heroin, polysubstance abuse in context of chronic pain -Continues to lack insight regarding his substance abuse stating "I do not have an addictive personality".  See above regarding requirement for acute pain medications with eventual transition to methadone. -Patient wishes to follow-up with methadone clinic after discharge -TTE negative for endocarditis -Patient has not been treated at a methadone clinic in greater than 1 year but would like to use methadone instead of OxyContin for acute pain management with plans to taper down to lowest dose possible at time of discharge.  If prescribed for pain would only be given a 7-day supply. -Patient plans to reestablish with methadone clinic after discharge   Covid positive -Asymptomatic -Isolation completed 9/11  Odynophagia -Continue Cepacol spray -Continue elixir medications where appropriate     Data Reviewed: Basic Metabolic Panel: Recent Labs   Lab 06/24/21 0048 06/24/21 0807 06/26/21 0847  NA 134* 133* 134*  K 5.4* 5.1 4.0  CL 96* 94* 98  CO2 '29 30 28  ' GLUCOSE 99 95 129*  BUN 26* 27* 28*  CREATININE 0.88 0.84 0.84  CALCIUM 9.8 9.6 9.4   Liver Function Tests: No results for input(s): AST, ALT, ALKPHOS, BILITOT, PROT, ALBUMIN in the last 168 hours.   CBC: Recent Labs  Lab 06/24/21 0048  WBC 12.6*  NEUTROABS 8.9*  HGB 11.9*  HCT 37.5*  MCV 82.2  PLT 362    Scheduled Meds:  baclofen  5 mg Oral QID   docusate sodium  100 mg Oral BID   enoxaparin (LOVENOX) injection  40 mg Subcutaneous Q24H   escitalopram  10 mg Oral Daily   feeding supplement  237 mL Oral BID BM   ibuprofen  400 mg Oral QID   multivitamin with minerals  1 tablet Oral Daily   oxyCODONE  40 mg Oral Q12H   pregabalin  75 mg Oral TID   sodium chloride flush  3 mL Intravenous Q12H   Continuous Infusions:  ceFEPime (MAXIPIME) IV 2 g (06/28/21 0626)    Principal Problem:   Epidural abscess Active Problems:   Heroin abuse ([place])   Homeless   COVID-19 virus infection   Consultants: Neurosurgery Infectious disease  Procedures: 2D echocardiogram Cervical discectomy with debridement of epidural abscess and placement of intervertebral lordotic cage  Antibiotics: Meropenem x1 dose 9/1 Vancomycin IV 9/1 through 9/4 Ceftriaxone 9/2 through 9/4 Cefepime 9/5 >>   Time spent: 35 minutes    Erin Hearing ANP  [facility] 7 am - 330 pm/M-F for direct patient care and secure chat Please refer to Amion for contact info 11  days

## 2021-06-28 NOTE — Progress Notes (Signed)
  NEUROSURGERY PROGRESS NOTE   No issues overnight. Pt does not report any significant dysphagia. No drainage from neck wound. Cont neck pain.  EXAM:  BP 126/70 (BP Location: Right Arm)   Pulse 75   Temp 97.7 F (36.5 C) (Oral)   Resp 16   Ht 5\' 9"  (1.753 m)   Wt 63.1 kg   SpO2 97%   BMI 20.54 kg/m   Awake, alert, oriented  Speech fluent, appropriate  CN grossly intact  5/5 BUE/BLE   IMAGING: CT neck reviewed, does demonstrate fluid superficial to the platysma with some fluid along the surgical approach to the prevertebral space.  IMPRESSION:  30 y.o. male s/p ACDF for debridement of cervical SEA. In the absence of wound dehiscence, purulent drainage, or significant dysphagia, would not elect advise surgical exploration.  PLAN: - Cont abx per primary team and cont to monitor.   34, MD [facility]

## 2021-06-29 MED ORDER — OXYCODONE HCL 5 MG PO TABS
5.0000 mg | ORAL_TABLET | ORAL | Status: DC | PRN
  Administered 2021-06-29 – 2021-07-04 (×18): 5 mg via ORAL
  Filled 2021-06-29 (×18): qty 1

## 2021-06-29 NOTE — Progress Notes (Signed)
[facility] PROGRESS NOTE  Gerald Oliver NTZ:[id number] DOB: 29-Oct-1990 DOA: 06/17/2021 PCP: Patient, No Pcp Per (Inactive)  Status: Remains inpatient appropriate because:Unsafe d/c plan, IV treatments appropriate due to intensity of illness or inability to take PO, and Inpatient level of care appropriate due to severity of illness  Dispo: The patient is from:  Homeless              Anticipated d/c is to:  Homeless-back to streets per his request              Patient currently is not medically stable to d/c.   Difficult to place patient No    Level of care: Telemetry Medical  Code Status: Full Family Communication: Patient only DVT prophylaxis: SCDs COVID vaccination status: Found to have incidental COVID this admission-asymptomatic-apparently was not immunized prior to admission   HPI: 30 year old male with history of IV drug use, prior heroin overdose in May 2022 comes into the hospital with upper back pain.  This is been going on for the last several days progressively getting worse.  He also reports subjective fevers.  He also smokes tobacco, marijuana and occasionally drinks EtOH.  He uses heroin regularly, last used 2 days prior to admission.  Of note, his brother is also hospitalized with IV drug use related complications.  On admission, he was noted to be incidentally COVID-positive.  MRI on admission showed an epidural abscess, neurosurgery consulted  Subjective: Primarily complaining of anterior and posterior neck pain.  Still focused on pain medications including timing and effectiveness of medications.  Long discussion held with patient as to why we cannot initiate methadone during this hospitalization.  Discussed plan of eventual weaning of long-acting narcotics can discharge home on short acting narcotics for a very short period of time.  He has been instructed as to date of anticipated discharge when antibiotics will be completed and he has been made aware he does  need to schedule a methadone clinic appointment as soon as possible after discharge.  Patient verbalized understanding of this plan.  Objective: Vitals:   06/28/21 2015 06/29/21 0015  BP: 130/70 99/71  Pulse:    Resp: 18 18  Temp: 98.7 F (37.1 C) 97.7 F (36.5 C)  SpO2: 98% 98%    Intake/Output Summary (Last 24 hours) at 06/29/2021 0739 Last data filed at 06/29/2021 6759 Gross per 24 hour  Intake 960 ml  Output 1200 ml  Net -240 ml   Filed Weights   06/24/21 0500 06/25/21 0427 06/26/21 0330  Weight: 63 kg 62.5 kg 63.1 kg    Exam: Constitutional: NAD, much less anxious but continues to report inadequate pain control and is focused on his pain medication Neck: Stable large fluctuant area beneath the incision, not erythematous, minimally palpation.  No drainage Respiratory: Lung sounds remain clear, no increased work of breathing, supine in bed.  Room air.  Normal pulse oximetry. Cardiovascular: Normal heart sounds, no peripheral edema or JVD, regular pulse and normotensive. Abdomen: Soft nontender nondistended with normal active bowel sounds.  LBM 9/10 Neurologic: CN 2-12 grossly intact. Sensation intact, Strength 5/5 x all 4 extremities.  Psychiatric: Normal judgment, continues to lack insight into his addictive behaviors.  Alert and oriented x 3.  Less anxious mood.    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 -MRI on admission c/w soft tissue infection with epidural/paraspinal phlegmon at the C4-C6 level  - 06/18/2021, status post discectomy, thecal sac decompression and epidural abscess debridement, also received  intervertebral biomechanical lordotic cage as well as anterior instrumentation with interbody plate per NS -ID following and recommended continue cefepime for 6 weeks beginning at 06/18/2021 (LD DUE 07/30/21) -ID recommended CBC and BMET weekly with ESR and CRP every 2-week (orders placed) -Much better pain control with  utilization of OxyContin 40 every 12 hours plus as needed IV Dilaudid.  Patient aware once methadone initiated IV Dilaudid will be discontinued. -9/13: Patient updated on rationale for not initiating methadone during the hospitalization but encouraged to follow-up with methadone clinic after discharge.  Given reports of inadequate pain control with patient stating Dilaudid no longer effective we discussed how oral pain medications provide longer and more efficient pain control therefore Oxy IR 5 mg added prn breakthrough pain and he is only allowed IV Dilaudid if 1 hour after dosing with Oxy IR pain ineffectively controlled-unfortunately I suspect he will continue to request Dilaudid until this is discontinued -Continue scheduled ibuprofen, Lyrica and baclofen. -Operative culture positive for Serratia.  New cefepime as recommended by ID  Superficial abscess versus seroma C5/C6 -Patient has been having ongoing neck pain postoperatively Significant adjustments and pain medication -Dressing to surgical site removed on 9/9 with notable erythema at distal aspect of incision. -Patient was evaluated by neurosurgery with soft tissue CT neck ordered.  This did reveal a superficial oval 4.1 x 0.4 x 3.6 cm fluid collection along the skin surface on the right suspicious for abscess.   -9/12 neurosurgery evaluated and did not think surgical intervention necessary at this time-recommendation is to continue current antibiotic -ID following as well  Insomnia/depression -Continue scheduled Ambien HS and Lexapro  Heroin, polysubstance abuse in context of chronic pain -Continues to lack insight regarding his substance abuse stating "I do not have an addictive personality".   -Patient wishes to follow-up with methadone clinic after discharge -TTE negative for endocarditis -See above regarding not starting methadone during this hospitalization -Patient plans to reestablish with methadone clinic after discharge    Covid positive -Asymptomatic -Isolation completed 9/11  Odynophagia -Continue Cepacol spray -Continue elixir medications where appropriate     Data Reviewed: Basic Metabolic Panel: Recent Labs  Lab 06/24/21 0048 06/24/21 0807 06/26/21 0847  NA 134* 133* 134*  K 5.4* 5.1 4.0  CL 96* 94* 98  CO2 '29 30 28  ' GLUCOSE 99 95 129*  BUN 26* 27* 28*  CREATININE 0.88 0.84 0.84  CALCIUM 9.8 9.6 9.4   Liver Function Tests: No results for input(s): AST, ALT, ALKPHOS, BILITOT, PROT, ALBUMIN in the last 168 hours.   CBC: Recent Labs  Lab 06/24/21 0048  WBC 12.6*  NEUTROABS 8.9*  HGB 11.9*  HCT 37.5*  MCV 82.2  PLT 362    Scheduled Meds:  baclofen  5 mg Oral QID   docusate sodium  100 mg Oral BID   enoxaparin (LOVENOX) injection  40 mg Subcutaneous Q24H   escitalopram  10 mg Oral Daily   feeding supplement  237 mL Oral BID BM   ibuprofen  400 mg Oral QID   multivitamin with minerals  1 tablet Oral Daily   oxyCODONE  40 mg Oral Q12H   pregabalin  75 mg Oral TID   sodium chloride flush  3 mL Intravenous Q12H   Continuous Infusions:  ceFEPime (MAXIPIME) IV 2 g (06/29/21 0622)    Principal Problem:   Epidural abscess Active Problems:   Heroin abuse ([place])   Homeless   COVID-19 virus infection   Consultants: Neurosurgery Infectious disease  Procedures: 2D  echocardiogram Cervical discectomy with debridement of epidural abscess and placement of intervertebral lordotic cage  Antibiotics: Meropenem x1 dose 9/1 Vancomycin IV 9/1 through 9/4 Ceftriaxone 9/2 through 9/4 Cefepime 9/5 >>   Time spent: 35 minutes    Erin Hearing ANP  [facility] 7 am - 330 pm/M-F for direct patient care and secure chat Please refer to Amion for contact info 12  days

## 2021-06-29 NOTE — Progress Notes (Signed)
[facility]  Date of Admission:  06/17/2021           Reason for visit: Follow up on cervical epidural abscess   Current antibiotics: Cefepime 9/5--pres    ASSESSMENT:    30 y.o. male admitted with:  Cervical epidural abscess: Status post C5-C6 ACDF (instrumented fusion) on 06/18/2021 with neurosurgery.  Cultures from the OR grew Serratia marcescens.  Blood cultures negative.  He has been on cefepime.  Due to some erythema along the inferior aspect of his incision, he had a repeat CT scan 06/27/2021 that showed a superficial 4.1 x 0.4 x 3.6 cm fluid collection along the skin surface on the right with peripheral enhancement suspicious for possible abscess.  Neurosurgery reviewed and recommends no surgical exploration unless he develops dehiscence, purulent drainage, or significant dysphagia. Injection drug use: Negative HCV RNA and HIV testing. COVID-19: Asymptomatic and now off precautions.  RECOMMENDATIONS:    Continue cefepime 2 g every 8 hours per pharmacy recommendations Continue lab monitoring with weekly CBC and BMP.  ESR and CRP every 2 weeks. Monitor incision site for any progression of fluid collection. Will follow.   Principal Problem:   Epidural abscess Active Problems:   Heroin abuse ([place])   Homeless   COVID-19 virus infection    MEDICATIONS:    Scheduled Meds:  baclofen  5 mg Oral QID   docusate sodium  100 mg Oral BID   enoxaparin (LOVENOX) injection  40 mg Subcutaneous Q24H   escitalopram  10 mg Oral Daily   feeding supplement  237 mL Oral BID BM   ibuprofen  400 mg Oral QID   multivitamin with minerals  1 tablet Oral Daily   oxyCODONE  40 mg Oral Q12H   pregabalin  75 mg Oral TID   sodium chloride flush  3 mL Intravenous Q12H   Continuous Infusions:  ceFEPime (MAXIPIME) IV 2 g (06/29/21 0622)   PRN Meds:.acetaminophen **OR** acetaminophen, bisacodyl, hydrALAZINE, HYDROmorphone (DILAUDID) injection, ondansetron **OR**  ondansetron (ZOFRAN) IV, phenol, polyethylene glycol, zolpidem  SUBJECTIVE:   32 hour events:  Afebrile No new labs this morning No new imaging No other acute events noted overnight Patient continues on cefepime 2 g every 8 hours   No new complaints.  Reports mild pain with swallowing.  No new swelling or redness around his incision and no drainage.  No fevers or chills.  Review of Systems  All other systems reviewed and are negative.    OBJECTIVE:   Blood pressure 128/74, pulse 68, temperature 98.5 F (36.9 C), temperature source Oral, resp. rate 17, height '5\' 9"'  (1.753 m), weight 63.1 kg, SpO2 98 %. Body mass index is 20.54 kg/m.  Physical Exam Constitutional:      General: He is not in acute distress.    Appearance: Normal appearance.  HENT:     Head: Normocephalic and atraumatic.  Eyes:     Extraocular Movements: Extraocular movements intact.     Conjunctiva/sclera: Conjunctivae normal.  Neck:     Comments: Cervical incision intact.  No drainage.  Inferior to the incision is stable swelling with no erythema.  No tenderness. Musculoskeletal:        General: Normal range of motion.  Skin:    General: Skin is warm and dry.  Neurological:     General: No focal deficit present.     Mental Status: He is alert and oriented to person, place, and time.  Psychiatric:  Mood and Affect: Mood normal.        Behavior: Behavior normal.     Lab Results: Lab Results  Component Value Date   WBC 12.6 (H) 06/24/2021   HGB 11.9 (L) 06/24/2021   HCT 37.5 (L) 06/24/2021   MCV 82.2 06/24/2021   PLT 362 06/24/2021    Lab Results  Component Value Date   NA 134 (L) 06/26/2021   K 4.0 06/26/2021   CO2 28 06/26/2021   GLUCOSE 129 (H) 06/26/2021   BUN 28 (H) 06/26/2021   CREATININE 0.84 06/26/2021   CALCIUM 9.4 06/26/2021   GFRNONAA >60 06/26/2021   GFRAA >60 05/09/2018    Lab Results  Component Value Date   ALT 9 06/17/2021   AST 13 (L) 06/17/2021   ALKPHOS 83  06/17/2021   BILITOT 0.8 06/17/2021       Component Value Date/Time   CRP 2.9 (H) 06/24/2021 0048       Component Value Date/Time   ESRSEDRATE 64 (H) 06/24/2021 0048     I have reviewed the micro and lab results in Epic.  Imaging: CT SOFT TISSUE NECK W CONTRAST  Result Date: 06/27/2021 CLINICAL DATA:  Neck abscess, deep tissue epidural abscess s/p drainage with swelling, eval for abscess EXAM: CT NECK WITH CONTRAST TECHNIQUE: Multidetector CT imaging of the neck was performed using the standard protocol following the bolus administration of intravenous contrast. CONTRAST:  48m OMNIPAQUE IOHEXOL 350 MG/ML SOLN COMPARISON:  None. FINDINGS: Pharynx and larynx: Normal. No mass or swelling. Salivary glands: No inflammation, mass, or stone. Thyroid: Normal. Lymph nodes: None enlarged or abnormal density. Vascular: Negative. Limited intracranial: Negative. Visualized orbits: Negative. Mastoids and visualized paranasal sinuses: Clear. Skeleton: At the C5-C6 level there is a superficial oval 4.1 x 0.4 x 3.6 Cm fluid collection along the skin surface on the right with peripheral enhancement/capsule and internal gas, suspicious for abscess. This collection is contiguous with more ill-defined fluid that tracks posteriorly in the right neck between the thyroid and common carotid artery to the site of C5-C6 ACDF with mild/ill-defined and incomplete peripheral enhancement. This fluid collection extends superiorly along the ventral aspect of the strap musculature to the underside of the submandibular gland (see series 3, image 61. There is small volume prevertebral edema extending from approximately C2-C7. Upper chest: Visualized lung apices are clear. IMPRESSION: 1. At the C5-C6 level there is a superficial oval 4.1 x 0.4 x 3.6 cm fluid collection along the skin surface on the right with peripheral enhancement/capsule and internal gas, suspicious for abscess given the clinical concern for such. This collection  is contiguous with more ill-defined fluid that tracks posteriorly in the right neck to the site of C5-C6 ACDF with mild/ill-defined and incomplete peripheral enhancement, nonspecific but potentially early abscess formation and/or postoperative. This fluid collection extends superiorly along the ventral aspect of the strap musculature to the underside of the submandibular gland. 2. Small volume prevertebral edema extending from approximately C2-C7, which could be postoperative or infectious in etiology. An MRI of the cervical spine with contrast could better evaluate the spine/canal if clinically indicated. Findings discussed with BReinaldo Meeker NP via telephone at 6:05 p.m. Electronically Signed   By: FMargaretha SheffieldM.D.   On: 06/27/2021 18:08     Imaging independently reviewed in Epic.    ARaynelle Highlandfor Infectious Disease [facility] [phone] 06/29/2021, 8:33 AM

## 2021-06-30 MED ORDER — OXYCODONE HCL ER 10 MG PO T12A
50.0000 mg | EXTENDED_RELEASE_TABLET | Freq: Two times a day (BID) | ORAL | Status: DC
  Administered 2021-06-30 – 2021-07-11 (×23): 50 mg via ORAL
  Filled 2021-06-30 (×24): qty 2

## 2021-06-30 NOTE — Progress Notes (Signed)
PROGRESS NOTE    Gerald Oliver  IOE:[id number] DOB: Jun 28, 1991 DOA: 06/17/2021 PCP: Patient, No Pcp Per (Inactive)   Brief Narrative: 30 past medical history significant for IV drug abuse, prior heroin overdose May 2022 presented to hospital with upper back pain.    On admission he was noted to be incidentally COVID-positive.  MRI on admission showed an epidural abscess.  Neurosurgery consulted.  Patient is status post discectomy, thecal sac decompression and epidural abscess debridement, also received intervertebral biomechanical lordotic cage as well as an anterior instrumentation with interbody plate per neurosurgery on 06/18/2021.  Operative culture positive for Serratia.  Antibiotics were changed to cefepime by ID.  Patient need 6 weeks of IV antibiotics.    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: -Status post C5-C6 ACD, or cultures grew Serratia, blood cultures are negative -Repeat CT neck noted erythema on the inferior aspect of the incision and a 4 x 3.6 cm fluid collection along the skin surface on the right suspicious for abscess -Neurosurgery following, recommended IV antibiotics, no plans to pursue surgical exploration unless he develops dehiscence, purulent drainage or significant dysphagia -Continue IV cefepime until 10/14 -Continue current regimen of OxyContin, oxycodone and as needed IV Dilaudid for severe pain, baclofen -Monitor CBC, BMP, ESR, CRP periodically  Insomnia/depression: Continue with lexapro. Change Ambien to PRN>   Heroin polysubstance abuse: Explain to patient we don't provide prescription for Methadone. 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  Code Status: Full code Family Communication: care discussed with  patient. Disposition Plan:  Status is: Inpatient  Remains inpatient appropriate because:IV treatments appropriate due to intensity of illness or inability to take PO  Dispo: The patient is from: Home              Anticipated d/c is to: Home              Patient currently is not medically stable to d/c. Needs to complete 6 weeks IV antibiotics.    Difficult to place patient No       Subjective: -Continues to have 7 out of 10 pain, sitting up in bed eating breakfast, appears comfortable, ambulating without issues  Objective: Vitals:   06/30/21 0029 06/30/21 0410 06/30/21 0748 06/30/21 1211  BP: 123/77 125/69 132/80 102/66  Pulse: 76 63 81 64  Resp: '18 18 18 14  ' Temp: 98.2 F (36.8 C) 98.3 F (36.8 C) 97.6 F (36.4 C) 98.2 F (36.8 C)  TempSrc: Oral Oral Oral Oral  SpO2: 98% 97% 100% 98%  Weight:      Height:        Intake/Output Summary (Last 24 hours) at 06/30/2021 1215 Last data filed at 06/30/2021 0918 Gross per 24 hour  Intake 823 ml  Output --  Net 823 ml   Filed Weights   06/24/21 0500 06/25/21 0427 06/26/21 0330  Weight: 63 kg 62.5 kg 63.1 kg    Examination:  Gen: Awake, Alert, Oriented X 3,  HEENT: Large swelling on the right side of the neck, slightly tender Lungs: Good air movement bilaterally, CTAB CVS: S1S2/RRR Abd: soft, Non tender, non distended, BS present Extremities: No edema Skin: no new rashes on exposed skin  Neuro: Moves all extremities, no localizing signs    Data Reviewed: I have personally  reviewed following labs and imaging studies  CBC: Recent Labs  Lab 06/24/21 0048  WBC 12.6*  NEUTROABS 8.9*  HGB 11.9*  HCT 37.5*  MCV 82.2  PLT 726   Basic Metabolic Panel: Recent Labs  Lab 06/24/21 0048 06/24/21 0807 06/26/21 0847  NA 134* 133* 134*  K 5.4* 5.1 4.0  CL 96* 94* 98  CO2 '29 30 28  ' GLUCOSE 99 95 129*  BUN 26* 27* 28*  CREATININE 0.88 0.84 0.84  CALCIUM 9.8 9.6 9.4   GFR: Estimated Creatinine Clearance: 112.7  mL/min (by C-G formula based on SCr of 0.84 mg/dL). Liver Function Tests: No results for input(s): AST, ALT, ALKPHOS, BILITOT, PROT, ALBUMIN in the last 168 hours. No results for input(s): LIPASE, AMYLASE in the last 168 hours. No results for input(s): AMMONIA in the last 168 hours. Coagulation Profile: No results for input(s): INR, PROTIME in the last 168 hours. Cardiac Enzymes: No results for input(s): CKTOTAL, CKMB, CKMBINDEX, TROPONINI in the last 168 hours. BNP (last 3 results) No results for input(s): PROBNP in the last 8760 hours. HbA1C: No results for input(s): HGBA1C in the last 72 hours. CBG: No results for input(s): GLUCAP in the last 168 hours. Lipid Profile: No results for input(s): CHOL, HDL, LDLCALC, TRIG, CHOLHDL, LDLDIRECT in the last 72 hours. Thyroid Function Tests: No results for input(s): TSH, T4TOTAL, FREET4, T3FREE, THYROIDAB in the last 72 hours. Anemia Panel: No results for input(s): VITAMINB12, FOLATE, FERRITIN, TIBC, IRON, RETICCTPCT in the last 72 hours. Sepsis Labs: No results for input(s): PROCALCITON, LATICACIDVEN in the last 168 hours.  No results found for this or any previous visit (from the past 240 hour(s)).    Scheduled Meds:  baclofen  5 mg Oral QID   docusate sodium  100 mg Oral BID   enoxaparin (LOVENOX) injection  40 mg Subcutaneous Q24H   escitalopram  10 mg Oral Daily   feeding supplement  237 mL Oral BID BM   ibuprofen  400 mg Oral QID   multivitamin with minerals  1 tablet Oral Daily   oxyCODONE  40 mg Oral Q12H   pregabalin  75 mg Oral TID   sodium chloride flush  3 mL Intravenous Q12H   Continuous Infusions:  ceFEPime (MAXIPIME) IV 2 g (06/30/21 0608)     LOS: 13 days    Time spent: 25 minutes.     Domenic Polite, MD [facility]   If 7PM-7AM, please contact night-coverage [email or web address]  06/30/2021, 12:15 PM

## 2021-06-30 NOTE — Progress Notes (Signed)
Pt given pain med at this time.  Attempting to assess patient, however, he is very irritable stating "I don't feel like talking."  Back into room and was told to leave.  No change noted in neck incision/swelling since yesterday.

## 2021-06-30 NOTE — Progress Notes (Signed)
Nutrition Follow-up  DOCUMENTATION CODES:  Not applicable  INTERVENTION:  -Continue Ensure po BID, each supplement provides 350 kcal and 13-20 grams of protein -Continue Magic cup TID with meals, each supplement provides 290 kcal and 9 grams of protein -Continue MVI with minerals daily  NUTRITION DIAGNOSIS:  Increased nutrient needs related to acute illness (epidural abscess) as evidenced by estimated needs. -- ongoing  GOAL:  Patient will meet greater than or equal to 90% of their needs -- progressing  MONITOR:  PO intake, Supplement acceptance, Labs, Weight trends, I & O's  REASON FOR ASSESSMENT:  Consult Assessment of nutrition requirement/status  ASSESSMENT:  30 yo male with a PMH of IVDA and prior heroin overdose in 02/2021 presenting with upper back pain.  He reports onset of pain last night.  +subjective fevers.  He occasionally smokes tobacco and marijuana and occasionally drinks alcohol.  He uses heroin routinely, last use 2 days ago.  He moved to [place] at age 39-11 and dropped out of school shortly in 2023-02-03 grade.  His father died when he was 1.  His mother died of esophageal cancer in 02/02/2017.  He and his brother are both homeless and heroin abusers.  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 Admitted with epidural abscess.  9/02 - s/p C5-C6 ACDF   Pt off COVID isolation. Continues to c/o pain, reports 7/10 intensity though appears comfortable and ambulating without issue. Pt continues to have a great appetite with 100% meal completion for most meals and, per RN, continues to do well with supplementation. Recommend continue current nutrition plan of care.     PO Intake: 50-100% x last 8 recorded meals (~94% average meal intake)  Medications: Ensure Enlive/Plus BID, colace, mvi with minerals Labs reviewed.  UOP: 1x unmeasured occurrence documented x24 hours I/O: -4L since admit   Diet Order:   Diet Order             Diet regular Room service appropriate?  Yes; Fluid consistency: Thin  Diet effective now                  EDUCATION NEEDS:  No education needs have been identified at this time  Skin:  Skin Assessment: Skin Integrity Issues: Skin Integrity Issues:: Incisions Incisions: neck  Last BM:  9/12  Height:  Ht Readings from Last 1 Encounters:  06/19/21 5\' 9"  (1.753 m)   Weight:  Wt Readings from Last 4 Encounters:  06/26/21 63.1 kg  03/13/21 65 kg  02/23/21 64.1 kg  05/07/18 66.9 kg   BMI:  Body mass index is 20.54 kg/m.  Estimated Nutritional Needs:  Kcal:  2100-2300 Protein:  80-95 grams Fluid:  >2.1 L    05/09/18, MS, RD, LDN (she/her/hers) RD pager number and weekend/on-call pager number located in Amion.

## 2021-06-30 NOTE — Progress Notes (Signed)
Pt previously insisting that I call MD to have dilaudid order changed.  Reminded him that he had spoken to MD and told him the changes that were made.  Educated on prn medication and offered support.

## 2021-07-01 LAB — CBC WITH DIFFERENTIAL/PLATELET
Abs Immature Granulocytes: 0.38 10*3/uL — ABNORMAL HIGH (ref 0.00–0.07)
Basophils Absolute: 0.1 10*3/uL (ref 0.0–0.1)
Basophils Relative: 1 %
Eosinophils Absolute: 0.3 10*3/uL (ref 0.0–0.5)
Eosinophils Relative: 4 %
HCT: 35.8 % — ABNORMAL LOW (ref 39.0–52.0)
Hemoglobin: 11.7 g/dL — ABNORMAL LOW (ref 13.0–17.0)
Immature Granulocytes: 5 %
Lymphocytes Relative: 35 %
Lymphs Abs: 2.9 10*3/uL (ref 0.7–4.0)
MCH: 26.5 pg (ref 26.0–34.0)
MCHC: 32.7 g/dL (ref 30.0–36.0)
MCV: 81 fL (ref 80.0–100.0)
Monocytes Absolute: 0.5 10*3/uL (ref 0.1–1.0)
Monocytes Relative: 6 %
Neutro Abs: 4.1 10*3/uL (ref 1.7–7.7)
Neutrophils Relative %: 49 %
Platelets: 314 10*3/uL (ref 150–400)
RBC: 4.42 MIL/uL (ref 4.22–5.81)
RDW: 14.1 % (ref 11.5–15.5)
WBC: 8.2 10*3/uL (ref 4.0–10.5)
nRBC: 0 % (ref 0.0–0.2)

## 2021-07-01 LAB — GLUCOSE, CAPILLARY: Glucose-Capillary: 105 mg/dL — ABNORMAL HIGH (ref 70–99)

## 2021-07-01 LAB — BASIC METABOLIC PANEL
Anion gap: 9 (ref 5–15)
BUN: 33 mg/dL — ABNORMAL HIGH (ref 6–20)
CO2: 28 mmol/L (ref 22–32)
Calcium: 9.7 mg/dL (ref 8.9–10.3)
Chloride: 101 mmol/L (ref 98–111)
Creatinine, Ser: 0.72 mg/dL (ref 0.61–1.24)
GFR, Estimated: >60 mL/min (ref >60)
Glucose, Bld: 108 mg/dL — ABNORMAL HIGH (ref 70–99)
Potassium: 4.7 mmol/L (ref 3.5–5.1)
Sodium: 138 mmol/L (ref 135–145)

## 2021-07-01 MED ORDER — BACLOFEN 10 MG PO TABS
10.0000 mg | ORAL_TABLET | Freq: Three times a day (TID) | ORAL | Status: DC
  Administered 2021-07-01 – 2021-07-12 (×35): 10 mg via ORAL
  Filled 2021-07-01 (×36): qty 1

## 2021-07-01 NOTE — Progress Notes (Signed)
PROGRESS NOTE    Gerald Oliver  KDT:[id number] DOB: 1991-08-08 DOA: 06/17/2021 PCP: Patient, No Pcp Per (Inactive)   Brief Narrative: 30 past medical history significant for IV drug abuse, prior heroin overdose May 2022 presented to hospital with upper back pain.    On admission he was noted to be incidentally COVID-positive.  MRI on admission showed an epidural abscess.  Neurosurgery consulted.  Patient is status post discectomy, thecal sac decompression and epidural abscess debridement, also received intervertebral biomechanical lordotic cage as well as an anterior instrumentation with interbody plate per neurosurgery on 06/18/2021.  Operative culture positive for Serratia.  Antibiotics were changed to cefepime by ID.  Patient need 6 weeks of IV antibiotics.    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: -Status post C5-C6 ACD, or cultures grew Serratia, blood cultures are negative -Repeat CT neck noted erythema on the inferior aspect of the incision and a 4 x 3.6 cm fluid collection along the skin surface on the right suspicious for abscess -Neurosurgery following, recommended IV antibiotics, no plans to pursue surgical exploration unless he develops dehiscence, purulent drainage or significant dysphagia -Continue IV cefepime until 10/14 -Currently on OxyContin, oxycodone and as needed IV Dilaudid for severe pain, increased baclofen dose, OxyContin increased yesterday -Monitor CBC, BMP, ESR, CRP periodically  Insomnia/depression: Continue with lexapro. Change Ambien to PRN>   Heroin polysubstance abuse: Dr. Tyrell Antonio explained to patient we don't provide prescription for Methadone. 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  Code Status: Full  code Family Communication: care discussed with patient. Disposition Plan:  Status is: Inpatient  Remains inpatient appropriate because:IV treatments appropriate due to intensity of illness or inability to take PO  Dispo: The patient is from: Home              Anticipated d/c is to: Home              Patient currently is not medically stable to d/c. Needs to complete 6 weeks IV antibiotics.    Difficult to place patient No       Subjective: -Requesting Dilaudid more often, educated regarding all his pain medicines  Objective: Vitals:   06/30/21 2343 07/01/21 0402 07/01/21 0847 07/01/21 1218  BP: 130/86 121/76 (!) 101/56 131/73  Pulse: 63 66 63 73  Resp: '20 16 16 16  ' Temp: (!) 97.5 F (36.4 C) 98.1 F (36.7 C) 98.3 F (36.8 C) 98.4 F (36.9 C)  TempSrc: Oral Oral Oral Oral  SpO2: 96% 98% 97% 99%  Weight:      Height:       No intake or output data in the 24 hours ending 07/01/21 1347  Filed Weights   06/24/21 0500 06/25/21 0427 06/26/21 0330  Weight: 63 kg 62.5 kg 63.1 kg    Examination:  Gen: Young male laying in bed, AAOx3, no distress HEENT: Large swelling in the right side of his neck, less tender today Lungs: Clear bilaterally CVS: S1-S2, regular rate rhythm Abdomen: Soft, nontender, bowel sounds present Extremities: No edema Neuro: Moves all extremities, no localizing signs Skin: Some track marks noted in his arms  Data Reviewed: I have personally reviewed following labs and imaging studies  CBC: Recent Labs  Lab 07/01/21 0112  WBC 8.2  NEUTROABS 4.1  HGB  11.7*  HCT 35.8*  MCV 81.0  PLT 725   Basic Metabolic Panel: Recent Labs  Lab 06/26/21 0847 07/01/21 0112  NA 134* 138  K 4.0 4.7  CL 98 101  CO2 28 28  GLUCOSE 129* 108*  BUN 28* 33*  CREATININE 0.84 0.72  CALCIUM 9.4 9.7   GFR: Estimated Creatinine Clearance: 118.3 mL/min (by C-G formula based on SCr of 0.72 mg/dL). Liver Function Tests: No results for input(s): AST, ALT,  ALKPHOS, BILITOT, PROT, ALBUMIN in the last 168 hours. No results for input(s): LIPASE, AMYLASE in the last 168 hours. No results for input(s): AMMONIA in the last 168 hours. Coagulation Profile: No results for input(s): INR, PROTIME in the last 168 hours. Cardiac Enzymes: No results for input(s): CKTOTAL, CKMB, CKMBINDEX, TROPONINI in the last 168 hours. BNP (last 3 results) No results for input(s): PROBNP in the last 8760 hours. HbA1C: No results for input(s): HGBA1C in the last 72 hours. CBG: No results for input(s): GLUCAP in the last 168 hours. Lipid Profile: No results for input(s): CHOL, HDL, LDLCALC, TRIG, CHOLHDL, LDLDIRECT in the last 72 hours. Thyroid Function Tests: No results for input(s): TSH, T4TOTAL, FREET4, T3FREE, THYROIDAB in the last 72 hours. Anemia Panel: No results for input(s): VITAMINB12, FOLATE, FERRITIN, TIBC, IRON, RETICCTPCT in the last 72 hours. Sepsis Labs: No results for input(s): PROCALCITON, LATICACIDVEN in the last 168 hours.  No results found for this or any previous visit (from the past 240 hour(s)).    Scheduled Meds:  baclofen  10 mg Oral TID   docusate sodium  100 mg Oral BID   enoxaparin (LOVENOX) injection  40 mg Subcutaneous Q24H   escitalopram  10 mg Oral Daily   feeding supplement  237 mL Oral BID BM   ibuprofen  400 mg Oral QID   multivitamin with minerals  1 tablet Oral Daily   oxyCODONE  50 mg Oral Q12H   pregabalin  75 mg Oral TID   sodium chloride flush  3 mL Intravenous Q12H   Continuous Infusions:  ceFEPime (MAXIPIME) IV 2 g (07/01/21 0616)     LOS: 14 days    Time spent: 25 minutes.     Domenic Polite, MD [facility]   07/01/2021, 1:47 PM

## 2021-07-01 NOTE — Progress Notes (Signed)
[facility]  Date of Admission:  06/17/2021           Reason for visit: Follow up on cervical epidural abscess  Current antibiotics: Cefepime 9/5--pres     ASSESSMENT:    30 y.o. male admitted with:  Cervical epidural abscess: Status post C5-C6 ACDF (instrumented fusion on 06/18/2021.  His operative cultures grew Serratia marcescens and blood cultures were negative.  He has been on cefepime.  A CT scan was obtained 06/27/2021 due to erythema along the inferior aspect of his incision.  This showed a superficial 4.1 x 0.4 x 3.6 cm fluid collection along the skin surface on the right with peripheral enhancement.  Neurosurgery reviewed and recommended no surgical exploration unless he develops dehiscence, purulent drainage or significant dysphagia.  This area of swelling and erythema has been stable/improved over the last several days. Injection drug use Asymptomatic COVID-19: Now off precautions  RECOMMENDATIONS:    Continue cefepime per pharmacy Continue lab monitoring with weekly CBC and BMP.  ESR and CRP every 2 weeks. Monitor incision site for any progression of fluid collection. Will continue to follow intermittently.   Principal Problem:   Epidural abscess Active Problems:   Heroin abuse ([place])   Homeless   COVID-19 virus infection    MEDICATIONS:    Scheduled Meds:  baclofen  5 mg Oral QID   docusate sodium  100 mg Oral BID   enoxaparin (LOVENOX) injection  40 mg Subcutaneous Q24H   escitalopram  10 mg Oral Daily   feeding supplement  237 mL Oral BID BM   ibuprofen  400 mg Oral QID   multivitamin with minerals  1 tablet Oral Daily   oxyCODONE  50 mg Oral Q12H   pregabalin  75 mg Oral TID   sodium chloride flush  3 mL Intravenous Q12H   Continuous Infusions:  ceFEPime (MAXIPIME) IV 2 g (07/01/21 0616)   PRN Meds:.acetaminophen **OR** acetaminophen, bisacodyl, hydrALAZINE, HYDROmorphone (DILAUDID) injection, ondansetron **OR**  ondansetron (ZOFRAN) IV, oxyCODONE, phenol, polyethylene glycol, zolpidem  SUBJECTIVE:   24 hour events:  No acute events noted overnight Afebrile, T-max 98.2 WBC 8.2 Creatinine 0.7 No new micro No new imaging  No new complaints.  No fevers or chills.  He reports stable discomfort with swallowing.  He has not noted any drainage from his incision.  He is worried that the swelling will resolve.  Review of Systems  All other systems reviewed and are negative.    OBJECTIVE:   Blood pressure 121/76, pulse 66, temperature 98.1 F (36.7 C), temperature source Oral, resp. rate 16, height '5\' 9"'  (1.753 m), weight 63.1 kg, SpO2 98 %. Body mass index is 20.54 kg/m.  Physical Exam Constitutional:      General: He is not in acute distress.    Appearance: Normal appearance.  HENT:     Head: Normocephalic and atraumatic.  Eyes:     Extraocular Movements: Extraocular movements intact.     Conjunctiva/sclera: Conjunctivae normal.  Neck:     Comments: No drainage from his cervical incision.  The incision remains intact with stable swelling and no erythema or tenderness.  Pulmonary:     Effort: Pulmonary effort is normal. No respiratory distress.  Skin:    General: Skin is warm and dry.     Findings: No rash.  Neurological:     General: No focal deficit present.     Mental Status: He is alert and oriented to person, place, and  time.  Psychiatric:        Mood and Affect: Mood normal.        Behavior: Behavior normal.     Lab Results: Lab Results  Component Value Date   WBC 8.2 07/01/2021   HGB 11.7 (L) 07/01/2021   HCT 35.8 (L) 07/01/2021   MCV 81.0 07/01/2021   PLT 314 07/01/2021    Lab Results  Component Value Date   NA 138 07/01/2021   K 4.7 07/01/2021   CO2 28 07/01/2021   GLUCOSE 108 (H) 07/01/2021   BUN 33 (H) 07/01/2021   CREATININE 0.72 07/01/2021   CALCIUM 9.7 07/01/2021   GFRNONAA >60 07/01/2021   GFRAA >60 05/09/2018    Lab Results  Component Value Date    ALT 9 06/17/2021   AST 13 (L) 06/17/2021   ALKPHOS 83 06/17/2021   BILITOT 0.8 06/17/2021       Component Value Date/Time   CRP 2.9 (H) 06/24/2021 0048       Component Value Date/Time   ESRSEDRATE 64 (H) 06/24/2021 0048     I have reviewed the micro and lab results in Epic.  Imaging: No results found.   Imaging independently reviewed in Epic.    Raynelle Highland for [facility]ious [facility] [phone] pager 07/01/2021, 8:31 AM  I spent greater than 35 minutes with the patient including greater than 50% of time in face to face counsel of the patient and in coordination of their care.

## 2021-07-02 MED ORDER — HYDROMORPHONE HCL 1 MG/ML IJ SOLN
1.0000 mg | INTRAMUSCULAR | Status: DC | PRN
  Administered 2021-07-02 – 2021-07-05 (×15): 1 mg via INTRAVENOUS
  Filled 2021-07-02 (×15): qty 1

## 2021-07-02 NOTE — Progress Notes (Signed)
Pt seen and examined, no changes ..continues to request IV dilaudid Q3hours, ambulating in room and resting otherwise, no new Neurological deficits. -continue IV Cefepime till 10/14  Zannie Cove, MD

## 2021-07-04 MED ORDER — PREGABALIN 25 MG PO CAPS
125.0000 mg | ORAL_CAPSULE | Freq: Three times a day (TID) | ORAL | Status: DC
  Administered 2021-07-04 – 2021-07-12 (×25): 125 mg via ORAL
  Administered 2021-07-12: 100 mg via ORAL
  Administered 2021-07-12: 25 mg via ORAL
  Administered 2021-07-12 – 2021-07-14 (×7): 125 mg via ORAL
  Administered 2021-07-15: 25 mg via ORAL
  Administered 2021-07-15: 100 mg via ORAL
  Administered 2021-07-15 – 2021-07-19 (×13): 125 mg via ORAL
  Filled 2021-07-04 (×48): qty 1

## 2021-07-04 MED ORDER — OXYCODONE HCL 5 MG PO TABS
10.0000 mg | ORAL_TABLET | ORAL | Status: DC | PRN
Start: 2021-07-04 — End: 2021-07-12
  Administered 2021-07-04 – 2021-07-12 (×33): 10 mg via ORAL
  Filled 2021-07-04 (×35): qty 2

## 2021-07-04 MED ORDER — OXYCODONE HCL 5 MG PO TABS: 10.0000 mg | ORAL_TABLET | ORAL | Status: DC | PRN

## 2021-07-04 MED ORDER — PREGABALIN 100 MG PO CAPS: 100.0000 mg | ORAL_CAPSULE | Freq: Three times a day (TID) | ORAL | Status: DC

## 2021-07-04 NOTE — Progress Notes (Signed)
PROGRESS NOTE    Gerald Oliver  ZDG:[id number] DOB: 1991/10/15 DOA: 06/17/2021 PCP: Patient, No Pcp Per (Inactive)   Brief Narrative: 30 past medical history significant for IV drug abuse, prior heroin overdose May 2022 presented to hospital with upper back pain.    On admission he was noted to be incidentally COVID-positive.  MRI on admission showed an epidural abscess.  Neurosurgery consulted.  Patient is status post discectomy, thecal sac decompression and epidural abscess debridement, also received intervertebral biomechanical lordotic cage as well as an anterior instrumentation with interbody plate per neurosurgery on 06/18/2021.  Operative culture positive for Serratia.  Antibiotics were changed to cefepime by ID.  Patient need 6 weeks of IV antibiotics.    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: -Status post C5-C6 ACD, or cultures grew Serratia, blood cultures are negative -Repeat CT neck noted erythema on the inferior aspect of the incision and a 4 x 3.6 cm fluid collection along the skin surface on the right suspicious for abscess -Neurosurgery following, recommended IV antibiotics, no plans to pursue surgical exploration unless he develops dehiscence, purulent drainage or significant dysphagia -Continue IV cefepime until 10/14 -Currently on OxyContin, oxycodone and PRN IV Dilaudid for severe pain, increased baclofen dose, OxyContin dose increased previously -with new radiculopathy likely from same, will increase Lyrica dose -Monitor CBC, BMP, ESR, CRP periodically  Insomnia/depression: Continue with lexapro. Change Ambien to PRN>   Heroin polysubstance abuse: Dr. Tyrell Antonio explained to patient we don't provide  prescription for Methadone. 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  Code Status: Full code Family Communication: care discussed with patient. Disposition Plan:  Status is: Inpatient  Remains inpatient appropriate because:IV treatments appropriate due to intensity of illness or inability to take PO  Dispo: The patient is from: Home              Anticipated d/c is to: Home              Patient currently is not medically stable to d/c. Needs to complete 6 weeks IV antibiotics.    Difficult to place patient No     Objective: Vitals:   07/03/21 1956 07/03/21 2334 07/04/21 0332 07/04/21 0737  BP: (!) 108/58 129/80 124/83 121/75  Pulse: (!) 57 69 63 66  Resp: '16 17 16 16  ' Temp: 98.1 F (36.7 C) 97.6 F (36.4 C) 98.2 F (36.8 C) 98.1 F (36.7 C)  TempSrc: Oral Oral Oral Oral  SpO2: 99% 96% 96%   Weight:      Height:        Intake/Output Summary (Last 24 hours) at 07/04/2021 1102 Last data filed at 07/03/2021 2149 Gross per 24 hour  Intake 540 ml  Output --  Net 540 ml    Filed Weights   06/25/21 0427 06/26/21 0330 07/03/21 0500  Weight: 62.5 kg 63.1 kg 66.8 kg    Examination:  Gen: Young male, in bed, sleeping when I walked in, appears to be in no distress  HEENT: large R neck swelling, unchanged. Lungs:  Good air movement bilaterally, CTAB CVS: S1S2/RRR Abd: soft, Non tender, non distended, BS present Extremities: No edema Skin: no new rashes on exposed skin  Neuro: Motor strength and sensations intact in extremities, reports some radiculopathy symptoms in his left arm  Data Reviewed: I have personally reviewed following labs and imaging studies  CBC: Recent Labs  Lab 07/01/21 0112  WBC 8.2  NEUTROABS 4.1  HGB 11.7*  HCT 35.8*  MCV 81.0  PLT 886    Basic Metabolic Panel: Recent Labs  Lab 07/01/21 0112  NA 138  K 4.7  CL 101  CO2 28  GLUCOSE 108*  BUN 33*  CREATININE 0.72  CALCIUM 9.7   GFR: Estimated Creatinine Clearance: 125.3 mL/min (by C-G formula based on SCr of 0.72 mg/dL). Liver Function Tests: No results for input(s): AST, ALT, ALKPHOS, BILITOT, PROT, ALBUMIN in the last 168 hours. No results for input(s): LIPASE, AMYLASE in the last 168 hours. No results for input(s): AMMONIA in the last 168 hours. Coagulation Profile: No results for input(s): INR, PROTIME in the last 168 hours. Cardiac Enzymes: No results for input(s): CKTOTAL, CKMB, CKMBINDEX, TROPONINI in the last 168 hours. BNP (last 3 results) No results for input(s): PROBNP in the last 8760 hours. HbA1C: No results for input(s): HGBA1C in the last 72 hours. CBG: Recent Labs  Lab 07/01/21 2104  GLUCAP 105*   Lipid Profile: No results for input(s): CHOL, HDL, LDLCALC, TRIG, CHOLHDL, LDLDIRECT in the last 72 hours. Thyroid Function Tests: No results for input(s): TSH, T4TOTAL, FREET4, T3FREE, THYROIDAB in the last 72 hours. Anemia Panel: No results for input(s): VITAMINB12, FOLATE, FERRITIN, TIBC, IRON, RETICCTPCT in the last 72 hours. Sepsis Labs: No results for input(s): PROCALCITON, LATICACIDVEN in the last 168 hours.  No results found for this or any previous visit (from the past 240 hour(s)).    Scheduled Meds:  baclofen  10 mg Oral TID   docusate sodium  100 mg Oral BID   enoxaparin (LOVENOX) injection  40 mg Subcutaneous Q24H   escitalopram  10 mg Oral Daily   feeding supplement  237 mL Oral BID BM   ibuprofen  400 mg Oral QID   multivitamin with minerals  1 tablet Oral Daily   oxyCODONE  50 mg Oral Q12H   pregabalin  125 mg Oral TID   sodium chloride flush  3 mL Intravenous Q12H   Continuous Infusions:  ceFEPime (MAXIPIME) IV 2 g (07/04/21 0501)     LOS: 17 days    Time spent: 25 minutes.     Domenic Polite, MD [facility]   07/04/2021, 11:02 AM

## 2021-07-05 ENCOUNTER — Inpatient Hospital Stay (HOSPITAL_COMMUNITY): Payer: Self-pay

## 2021-07-05 LAB — CBC WITH DIFFERENTIAL/PLATELET
Abs Immature Granulocytes: 0.16 10*3/uL — ABNORMAL HIGH (ref 0.00–0.07)
Basophils Absolute: 0.1 10*3/uL (ref 0.0–0.1)
Basophils Relative: 1 %
Eosinophils Absolute: 0.4 10*3/uL (ref 0.0–0.5)
Eosinophils Relative: 5 %
HCT: 38 % — ABNORMAL LOW (ref 39.0–52.0)
Hemoglobin: 12.1 g/dL — ABNORMAL LOW (ref 13.0–17.0)
Immature Granulocytes: 2 %
Lymphocytes Relative: 31 %
Lymphs Abs: 2.1 10*3/uL (ref 0.7–4.0)
MCH: 26 pg (ref 26.0–34.0)
MCHC: 31.8 g/dL (ref 30.0–36.0)
MCV: 81.5 fL (ref 80.0–100.0)
Monocytes Absolute: 0.6 10*3/uL (ref 0.1–1.0)
Monocytes Relative: 9 %
Neutro Abs: 3.5 10*3/uL (ref 1.7–7.7)
Neutrophils Relative %: 52 %
Platelets: 193 10*3/uL (ref 150–400)
RBC: 4.66 MIL/uL (ref 4.22–5.81)
RDW: 14.6 % (ref 11.5–15.5)
WBC: 6.8 10*3/uL (ref 4.0–10.5)
nRBC: 0 % (ref 0.0–0.2)

## 2021-07-05 LAB — SEDIMENTATION RATE: Sed Rate: 14 mm/hr (ref 0–16)

## 2021-07-05 LAB — C-REACTIVE PROTEIN: CRP: 1.4 mg/dL — ABNORMAL HIGH (ref <1.0)

## 2021-07-05 MED ORDER — HYDROMORPHONE HCL 1 MG/ML IJ SOLN
1.0000 mg | Freq: Four times a day (QID) | INTRAMUSCULAR | Status: DC | PRN
  Administered 2021-07-05 – 2021-07-08 (×10): 1 mg via INTRAVENOUS
  Filled 2021-07-05 (×11): qty 1

## 2021-07-05 MED ORDER — GADOBUTROL 1 MMOL/ML IV SOLN
7.0000 mL | Freq: Once | INTRAVENOUS | Status: AC | PRN
  Administered 2021-07-05: 7 mL via INTRAVENOUS

## 2021-07-05 NOTE — Progress Notes (Addendum)
[facility] PROGRESS NOTE  Gerald Oliver XHB:[id number] DOB: [birth year] DOA: 06/17/2021 PCP: Patient, No Pcp Per (Inactive)  Status: Remains inpatient appropriate because:Unsafe d/c plan, IV treatments appropriate due to intensity of illness or inability to take PO, and Inpatient level of care appropriate due to severity of illness  Dispo: The patient is from:  Homeless              Anticipated d/c is to:  Homeless-back to streets per his request              Patient currently is not medically stable to d/c.   Difficult to place patient No    Level of care: Telemetry Medical  Code Status: Full Family Communication: Patient only DVT prophylaxis: SCDs COVID vaccination status: Found to have incidental COVID this admission-asymptomatic-apparently was not immunized prior to admission   HPI: 30 year old male with history of IV drug use, prior heroin overdose in May 2022 comes into the hospital with upper back pain.  This is been going on for the last several days progressively getting worse.  He also reports subjective fevers.  He also smokes tobacco, marijuana and occasionally drinks EtOH.  He uses heroin regularly, last used 2 days prior to admission.  Of note, his brother is also hospitalized with IV drug use related complications.  On admission, he was noted to be incidentally COVID-positive.  MRI on admission showed an epidural abscess, neurosurgery consulted  Subjective: Patient complaining of recent issues over the weekend with numbness and tingling as well as pain in left arm primarily at the bicep but initial episode began from the shoulder and down to the hands.  Discussed with patient need for MRI to better clarify to make sure not radicular pain and only a musculoskeletal issue.  As per usual our discussion started off with pain medications and request for more IV Dilaudid.  He was blaming the staff for not receiving his medication.  I told him that we were now in the  process of trying to wean the Dilaudid since he would not be able to have it at discharge and at some point we will also begin weaning the long-acting OxyContin to a more short acting form for discharge and that we would not be able to discharge him home on OxyContin.  I also told him that beginning today we would decrease the Dilaudid to every 6 hours as needed for at least the next 48 hours with next change to be a decrease to every 8 hours as needed.  He had multiple questions that were answered but verbalized understanding of this plan.  Within 30 minutes of my leaving the room he demanded the nurse give him IV Dilaudid despite it not being time and became angry with her because she would not and told her "no one had told me that my medications were being changed".  Spoke with nurse about sticking to plan noting I had previously sent her a secure chat regarding this plan.  Told her that if patient needed that she could give him Ativan 1 mg IV to tolerate MRI.  Objective: Vitals:   07/05/21 0330 07/05/21 0746  BP: 115/68 129/77  Pulse: 64 84  Resp: 15 14  Temp: 97.9 F (36.6 C) 97.8 F (36.6 C)  SpO2: 99% 96%    Intake/Output Summary (Last 24 hours) at 07/05/2021 0753 Last data filed at 07/04/2021 2200 Gross per 24 hour  Intake 240 ml  Output --  Net 240 ml  Filed Weights   06/26/21 0330 07/03/21 0500 07/05/21 0500  Weight: 63.1 kg 66.8 kg 68.3 kg    Exam: Constitutional: NAD, reporting left arm pain and continues to focus on IV Dilaudid and other pain medications.  Reports [place] helpful. Neck: There is fluid collection under neck incision resolved.  No redness or drainage today. Respiratory: Lung sounds clear, normal work of breathing, room air, normal oximetry readings Cardiovascular: S1-S2, no JVD, no peripheral edema, normotensive, regular Abdomen: Soft nontender nondistended with normal active bowel sounds.  LBM 9/18 Neurologic: CN 2-12 grossly intact. Sensation intact,  Strength 5/5 x all 4 extremities.  Psychiatric: Normal judgment, continues to lack insight into his addictive behaviors.  Alert and oriented x 3.  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 -MRI on admission c/w soft tissue infection with epidural/paraspinal phlegmon at the C4-C6 level  - 06/18/2021, status post discectomy, thecal sac decompression and epidural abscess debridement, also received intervertebral biomechanical lordotic cage as well as anterior instrumentation with interbody plate per NS -ID following and recommended continue cefepime for 6 weeks beginning at 06/18/2021 (LD DUE 07/30/21) -ID recommended CBC and BMET weekly with ESR and CRP every 2-week (orders placed) -Much better pain control with utilization of OxyContin 40 every 12 hours plus as needed IV Dilaudid.  As of 9/19 slow tapering of IV Dilaudid initiated with dose decreased to every 6 hours prn with plans to further decrease every 48-72 hours -Continue low-dose Oxy IR for breakthrough pain -Continue scheduled ibuprofen, Lyrica and baclofen. -Operative culture positive for Serratia.  New cefepime as recommended by ID -Patient reporting left arm pain suspicious for possible radiculopathy.  MRI C-spine with and without contrast obtained and revealed: 1. Epidural enhancement within the ventral canal at C4-C6 is persistent but improved status post ACDF. Associated canal stenosis at C5-C6 is improved, now mild. Enhancing phlegmon probably extends into bilateral foramina without significant bony foraminal stenosis at this level. 2. Increased prevertebral edema/thickening and enhancement which extends more cranially to approximately C2 and caudally to C7, likely infectious phlegmon. More superficial fluid collections in the right neck were better characterized on recent CT neck. 3. At C6-C7, no  substantial change in moderate right foraminal stenosis and mild canal stenosis. -Secure chat sent to my attending, Dr. Juleen China with ID and Arnetha Massy, NP with neurosurgery to update on these findings.  Patient without fever-last WBC on 9/15 was normal but we will repeat CBC as well as CRP and ESR today (note that patient on scheduled CBC and CRP as recommended by ID).    Superficial abscess versus seroma C5/C6 -Patient has been having ongoing neck pain postoperatively but at this juncture given duration since surgery and resolution of fluctuant fluid collection under incision it is expected pain should be minimal at this point -Soft tissue CT neck ordered by NS w/superficial oval 4.1 x 0.4 x 3.6 cm fluid collection along the skin surface on the right suspicious for abscess. NS felt surgery not indicated and recommended continuing antibiotics -ID following   Insomnia/depression -Continue scheduled Ambien HS and Lexapro -Patient reports improved sleep patterns with addition of these 2 medications -Consider increased dose of Lexapro as we wean IV Dilaudid and other narcotic pain medication  Heroin, polysubstance abuse in context of chronic pain -Continues to lack insight regarding his substance abuse stating "I do not have an addictive personality".   -Patient wishes to follow-up with methadone clinic  after discharge -TTE negative for endocarditis -See above regarding not starting methadone during this hospitalization -Patient plans to reestablish with methadone clinic after discharge   Covid positive -Asymptomatic -Isolation completed 9/11  Odynophagia -Continue Cepacol spray -Continue elixir medications where appropriate     Data Reviewed: Basic Metabolic Panel: Recent Labs  Lab 07/01/21 0112  NA 138  K 4.7  CL 101  CO2 28  GLUCOSE 108*  BUN 33*  CREATININE 0.72  CALCIUM 9.7   Liver Function Tests: No results for input(s): AST, ALT, ALKPHOS, BILITOT, PROT, ALBUMIN in  the last 168 hours.   CBC: Recent Labs  Lab 07/01/21 0112  WBC 8.2  NEUTROABS 4.1  HGB 11.7*  HCT 35.8*  MCV 81.0  PLT 314    Scheduled Meds:  baclofen  10 mg Oral TID   docusate sodium  100 mg Oral BID   enoxaparin (LOVENOX) injection  40 mg Subcutaneous Q24H   escitalopram  10 mg Oral Daily   feeding supplement  237 mL Oral BID BM   ibuprofen  400 mg Oral QID   multivitamin with minerals  1 tablet Oral Daily   oxyCODONE  50 mg Oral Q12H   pregabalin  125 mg Oral TID   sodium chloride flush  3 mL Intravenous Q12H   Continuous Infusions:  ceFEPime (MAXIPIME) IV 2 g (07/05/21 0620)    Principal Problem:   Epidural abscess Active Problems:   Heroin abuse ([place])   Homeless   COVID-19 virus infection   Consultants: Neurosurgery Infectious disease  Procedures: 2D echocardiogram Cervical discectomy with debridement of epidural abscess and placement of intervertebral lordotic cage  Antibiotics: Meropenem x1 dose 9/1 Vancomycin IV 9/1 through 9/4 Ceftriaxone 9/2 through 9/4 Cefepime 9/5 >>   Time spent: 35 minutes    Erin Hearing ANP  [facility] 7 am - 330 pm/M-F for direct patient care and secure chat Please refer to Amion for contact info 18  days

## 2021-07-05 NOTE — Progress Notes (Signed)
[facility]  Date of Admission:  06/17/2021           Reason for visit: Follow up on epidural abscess  Current antibiotics: Cefepime 9/5--pres    ASSESSMENT:    30 y.o. male admitted with:  Cervical epidural abscess: Status post C5-C6 ACDF (instrumented fusion) on 06/18/2021.  OR cultures grew Serratia marcescens and blood cultures were negative.  He has been maintained on cefepime.  He did have a CT scan 06/27/2021 due to erythema along the inferior aspect of his incision.  This showed a superficial 4.1 x 0.4 x 3.6 cm fluid collection along the skin surface on the right with peripheral enhancement.  No surgical exploration was recommended unless he developed dehiscence, drainage, or significant dysphagia.  This morning he is complaining of numbness and tingling in his left arm with decreased strength.  An MRI is being pursued. Injection drug use: HIV negative earlier this admission with HCV RNA detected but less than 15. Asymptomatic COVID-19: Now off precautions.  RECOMMENDATIONS:    Continue cefepime per pharmacy Agree with MRI Continue lab monitoring Continue monitoring incision for any progression Should probably recheck his HCV RNA in 3 months to ensure remains undetectable Will follow   Principal Problem:   Epidural abscess Active Problems:   Heroin abuse ([facility])   Homeless   COVID-19 virus infection    MEDICATIONS:    Scheduled Meds:  baclofen  10 mg Oral TID   docusate sodium  100 mg Oral BID   enoxaparin (LOVENOX) injection  40 mg Subcutaneous Q24H   escitalopram  10 mg Oral Daily   feeding supplement  237 mL Oral BID BM   ibuprofen  400 mg Oral QID   multivitamin with minerals  1 tablet Oral Daily   oxyCODONE  50 mg Oral Q12H   pregabalin  125 mg Oral TID   sodium chloride flush  3 mL Intravenous Q12H   Continuous Infusions:  ceFEPime (MAXIPIME) IV 2 g (07/05/21 0620)   PRN Meds:.acetaminophen **OR** acetaminophen,  bisacodyl, hydrALAZINE, HYDROmorphone (DILAUDID) injection, ondansetron **OR** ondansetron (ZOFRAN) IV, oxyCODONE, phenol, polyethylene glycol, zolpidem  SUBJECTIVE:   24 hour events:  No acute events over the weekend noted No new labs since 9/15 No new imaging No new cultures  Patient complained this morning of decreased left upper extremity sensation and strength.  Planning for MRI of his cervical spine.  No fevers.  He reports the swelling around his incision site is improved and there has been no drainage.  Review of Systems  All other systems reviewed and are negative.    OBJECTIVE:   Blood pressure 129/77, pulse 84, temperature 97.8 F (36.6 C), temperature source Oral, resp. rate 14, height 5\' 9"  (1.753 m), weight 68.3 kg, SpO2 96 %. Body mass index is 22.24 kg/m.  Physical Exam Constitutional:      Appearance: Normal appearance.  HENT:     Head: Normocephalic and atraumatic.  Eyes:     Extraocular Movements: Extraocular movements intact.     Conjunctiva/sclera: Conjunctivae normal.  Neck:     Comments: Neck incision without drainage, improved swelling.  Pulmonary:     Effort: Pulmonary effort is normal. No respiratory distress.  Neurological:     General: No focal deficit present.     Mental Status: He is alert and oriented to person, place, and time.     Motor: Weakness present.     Comments: Decreased grip strength in left upper extremity.  Psychiatric:        Mood and Affect: Mood normal.        Behavior: Behavior normal.     Lab Results: Lab Results  Component Value Date   WBC 8.2 07/01/2021   HGB 11.7 (L) 07/01/2021   HCT 35.8 (L) 07/01/2021   MCV 81.0 07/01/2021   PLT 314 07/01/2021    Lab Results  Component Value Date   NA 138 07/01/2021   K 4.7 07/01/2021   CO2 28 07/01/2021   GLUCOSE 108 (H) 07/01/2021   BUN 33 (H) 07/01/2021   CREATININE 0.72 07/01/2021   CALCIUM 9.7 07/01/2021   GFRNONAA >60 07/01/2021   GFRAA >60 05/09/2018     Lab Results  Component Value Date   ALT 9 06/17/2021   AST 13 (L) 06/17/2021   ALKPHOS 83 06/17/2021   BILITOT 0.8 06/17/2021       Component Value Date/Time   CRP 2.9 (H) 06/24/2021 0048       Component Value Date/Time   ESRSEDRATE 64 (H) 06/24/2021 0048     I have reviewed the micro and lab results in Epic.  Imaging: No results found.   Imaging independently reviewed in Epic.    Vedia Coffer for Infectious Disease [facility] [phone] pager 07/05/2021, 8:20 AM  I spent greater than 35 minutes with the patient including greater than 50% of time in face to face counsel of the patient and in coordination of their care.

## 2021-07-05 NOTE — Plan of Care (Signed)

## 2021-07-06 DIAGNOSIS — G061 Intraspinal abscess and granuloma: Principal | ICD-10-CM

## 2021-07-06 MED ORDER — ENSURE MAX PROTEIN PO LIQD
11.0000 [oz_av] | Freq: Two times a day (BID) | ORAL | Status: DC
  Administered 2021-07-06 – 2021-07-19 (×25): 11 [oz_av] via ORAL
  Filled 2021-07-06 (×29): qty 330

## 2021-07-06 NOTE — Progress Notes (Signed)
Nutrition Follow-up  DOCUMENTATION CODES:   Not applicable  INTERVENTION:  - Continue MVI w/ minerals   - Change to Ensure Max po BID, each supplement provides 150 kcal and 30 grams of protein.    NUTRITION DIAGNOSIS:   Increased nutrient needs related to acute illness (epidural abscess) as evidenced by estimated needs. - Ongoing   GOAL:   Patient will meet greater than or equal to 90% of their needs - Progressing   MONITOR:   Skin, PO intake, Supplement acceptance  REASON FOR ASSESSMENT:   Consult Assessment of nutrition requirement/status  ASSESSMENT:   30 yo male with a PMH of IVDA and prior heroin overdose in 02/2021 presenting with upper back pain.  He reports onset of pain last night.  +subjective fevers.  He occasionally smokes tobacco and marijuana and occasionally drinks alcohol.  He uses heroin routinely, last use 2 days ago.  He moved to [place] at age [age] and dropped out of school shortly in 2023/02/21 grade.  His father died when he was 42.  His mother died of esophageal cancer in Feb 20, 2017.  He and his brother are both homeless and heroin abusers.  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 Admitted with epidural abscess.  9/2: OR for discectomy of C5-6 & debridement of epidural abscess   Pt reports that his appetite is good and that he is eating all of his food. Reports that he likes the Ensures and would like to continue to drink them. Discussed transitioning to Ensure Max for the additional protein; pt agreeable to that.    Per EMR, po intake includes 100%.   Medications reviewed and include: MVI, Lyrica, IV antibiotic Labs reviewed.  Admitted weight: Unknown  Current weight: 68.8 kg  NUTRITION - FOCUSED PHYSICAL EXAM:  Flowsheet Row Most Recent Value  Orbital Region No depletion  Upper Arm Region No depletion  Thoracic and Lumbar Region No depletion  Buccal Region No depletion  Temple Region No depletion  Clavicle Bone Region No depletion   Clavicle and Acromion Bone Region No depletion  Scapular Bone Region No depletion  Dorsal Hand No depletion  Patellar Region No depletion  Anterior Thigh Region No depletion  Posterior Calf Region No depletion  Edema (RD Assessment) None  Hair Reviewed  Eyes Reviewed  Mouth Reviewed  Skin Reviewed  Nails Reviewed      Diet Order:   Diet Order             Diet regular Room service appropriate? Yes; Fluid consistency: Thin  Diet effective now                   EDUCATION NEEDS:   No education needs have been identified at this time  Skin:  Skin Assessment:  Skin Integrity Issues: Incisions: neck  Last BM:  9/19  Height:   Ht Readings from Last 1 Encounters:  06/19/21 5\' 9"  (1.753 m)    Weight:   Wt Readings from Last 1 Encounters:  07/06/21 68.8 kg    Ideal Body Weight:  72.7 kg  BMI:  Body mass index is 22.4 kg/m.  Estimated Nutritional Needs:   Kcal:  2100-2300  Protein:  100-115 grams  Fluid:  >2.1 L    07/08/21 BS, PLDN Clinical Dietitian See AMiON for contact information.

## 2021-07-06 NOTE — Progress Notes (Signed)
[facility]  Date of Admission:  06/17/2021           Reason for visit: Follow up on epidural abscess  Current antibiotics: Cefepime 9/5--present  ASSESSMENT:    30 y.o. male admitted with:  Cervical epidural abscess: Status post C5-C6 ACDF (instrumented fusion) on 06/18/2021 with OR cultures growing Serratia marcescens.  He has been on cefepime since 9/5.  He had an MRI obtained yesterday due to complaint of numbness and tingling with decreased strength in his left arm that showed improved epidural enhancement and phlegmonous changes that may be infectious.  Repeat labs were overall reassuring with improved inflammatory markers and normal WBC. Injection drug use: HIV negative this admission and HCVRNA detected at less than 15.  RECOMMENDATIONS:    Continue cefepime per pharmacy Defer to neurosurgery regarding updated MRI findings to determine if further intervention is needed. Continue monitoring incision for any progression and continue monitoring neurologic status regarding his left upper extremity symptoms Plan to recheck HCVRNA in about 3 months Continue lab monitoring Will follow   Principal Problem:   Epidural abscess Active Problems:   Heroin abuse (HCC)   Homeless   COVID-19 virus infection    MEDICATIONS:    Scheduled Meds:  baclofen  10 mg Oral TID   docusate sodium  100 mg Oral BID   enoxaparin (LOVENOX) injection  40 mg Subcutaneous Q24H   escitalopram  10 mg Oral Daily   feeding supplement  237 mL Oral BID BM   ibuprofen  400 mg Oral QID   multivitamin with minerals  1 tablet Oral Daily   oxyCODONE  50 mg Oral Q12H   pregabalin  125 mg Oral TID   sodium chloride flush  3 mL Intravenous Q12H   Continuous Infusions:  ceFEPime (MAXIPIME) IV 2 g (07/06/21 0538)   PRN Meds:.acetaminophen **OR** acetaminophen, bisacodyl, hydrALAZINE, HYDROmorphone (DILAUDID) injection, ondansetron **OR** ondansetron (ZOFRAN) IV, oxyCODONE,  phenol, polyethylene glycol, zolpidem  SUBJECTIVE:   24 hour events:  No acute events overnight No fevers Improved labs with inflammatory markers Status post MRI  Patient reports no fevers or chills.  He endorses improved radicular pain in his left arm but still has symptoms with certain arm movements.  Lyrica seems to be helping.  Review of Systems  All other systems reviewed and are negative.    OBJECTIVE:   Blood pressure 110/79, pulse 67, temperature 98.2 F (36.8 C), temperature source Oral, resp. rate 20, height 5\' 9"  (1.753 m), weight 68.8 kg, SpO2 100 %. Body mass index is 22.4 kg/m.  Physical Exam Constitutional:      General: He is not in acute distress.    Appearance: Normal appearance.  HENT:     Head: Normocephalic and atraumatic.  Eyes:     Extraocular Movements: Extraocular movements intact.     Conjunctiva/sclera: Conjunctivae normal.  Neck:     Comments: Cervical incision with no dehiscence or drainage.  Improved swelling. Pulmonary:     Effort: Pulmonary effort is normal. No respiratory distress.  Musculoskeletal:     Comments: He is able to move his left upper extremity without issues.  Skin:    General: Skin is warm and dry.  Neurological:     General: No focal deficit present.     Mental Status: He is alert and oriented to person, place, and time.  Psychiatric:        Mood and Affect: Mood normal.  Behavior: Behavior normal.     Lab Results: Lab Results  Component Value Date   WBC 6.8 07/05/2021   HGB 12.1 (L) 07/05/2021   HCT 38.0 (L) 07/05/2021   MCV 81.5 07/05/2021   PLT 193 07/05/2021    Lab Results  Component Value Date   NA 138 07/01/2021   K 4.7 07/01/2021   CO2 28 07/01/2021   GLUCOSE 108 (H) 07/01/2021   BUN 33 (H) 07/01/2021   CREATININE 0.72 07/01/2021   CALCIUM 9.7 07/01/2021   GFRNONAA >60 07/01/2021   GFRAA >60 05/09/2018    Lab Results  Component Value Date   ALT 9 06/17/2021   AST 13 (L) 06/17/2021    ALKPHOS 83 06/17/2021   BILITOT 0.8 06/17/2021       Component Value Date/Time   CRP 1.4 (H) 07/05/2021 1345       Component Value Date/Time   ESRSEDRATE 14 07/05/2021 1352     I have reviewed the micro and lab results in Epic.  Imaging: MR CERVICAL SPINE W WO CONTRAST  Result Date: 07/05/2021 CLINICAL DATA:  Cervical radiculopathy, prior C-spine surgery EXAM: MRI CERVICAL SPINE WITHOUT AND WITH CONTRAST TECHNIQUE: Multiplanar and multiecho pulse sequences of the cervical spine, to include the craniocervical junction and cervicothoracic junction, were obtained without and with intravenous contrast. CONTRAST:  68mL GADAVIST GADOBUTROL 1 MMOL/ML IV SOLN COMPARISON:  MRI cervical spine 06/17/2021.  CT neck 06/27/2021. FINDINGS: Alignment: Similar alignment with straightening of the normal cervical lordosis. No substantial sagittal subluxation. Vertebrae: C5-C6 ACDF. Metallic artifact limits evaluation at this level without obvious marrow edema to suggest acute fracture or osteomyelitis. No suspicious bone lesions. Cord: Normal cord signal. Posterior Fossa, vertebral arteries, paraspinal tissues: Increased prevertebral edema/thickening and enhancement which extends more cranially to approximately C2 and caudally to C7, likely infectious phlegmon. More superficial fluid collections in the right neck were better characterized on recent CT neck. Visualized vertebral artery flow voids are maintained. No obvious acute abnormality in the visualized posterior fossa. Disc levels: C2-C3: No significant disc protrusion, foraminal stenosis, or canal stenosis. C3-C4: Mild uncovertebral hypertrophy without significant canal or foraminal stenosis. C4-C5: Mild right greater than left uncovertebral hypertrophy. Previously seen enhancing soft tissue within the ventral aspect of the canal has essentially resolved at this level. As a result, canal stenosis is improved, not significant this level. Similar mild right  foraminal stenosis. C5-C6: ACDF. There is improved, but persistent enhancing soft tissue within the ventral epidural canal. Resulting effacement of the ventral CSF with overall mild canal stenosis, improved. Enhancing phlegmon extends into bilateral foramina without significant bony foraminal stenosis. C6-C7: Right eccentric posterior disc osteophyte complex. Slightly improved epidural enhancement ventrally. Right greater than left uncovertebral hypertrophy. No significant change in mild canal stenosis and moderate right foraminal stenosis. C7-T1: No significant disc protrusion, foraminal stenosis, or canal stenosis. Mild facet arthropathy. IMPRESSION: 1. Epidural enhancement within the ventral canal at C4-C6 is persistent but improved status post ACDF. Associated canal stenosis at C5-C6 is improved, now mild. Enhancing phlegmon probably extends into bilateral foramina without significant bony foraminal stenosis at this level. 2. Increased prevertebral edema/thickening and enhancement which extends more cranially to approximately C2 and caudally to C7, likely infectious phlegmon. More superficial fluid collections in the right neck were better characterized on recent CT neck. 3. At C6-C7, no substantial change in moderate right foraminal stenosis and mild canal stenosis. Electronically Signed   By: Feliberto Harts M.D.   On: 07/05/2021 12:56  Imaging independently reviewed in Epic.    Vedia Coffer for Infectious Disease [facility] [phone] pager 07/06/2021, 10:25 AM  I spent greater than 35 minutes with the patient including greater than 50% of time in face to face counsel of the patient and in coordination of their care.

## 2021-07-06 NOTE — Progress Notes (Signed)
No changes from yesterday, remains fixated on IV dilaudid dosing frequency -Repeat MRI noted improved epidural enhancement and phlegmonous changes, inflammatory markers, ESR and CRP down significantly, neurosurgery was notified of MRI findings yesterday -Continue IV cefepime until 10/14 -No med changes today  Domenic Polite, MD

## 2021-07-07 MED ORDER — DICLOFENAC SODIUM 1 % EX GEL
4.0000 g | Freq: Four times a day (QID) | CUTANEOUS | Status: DC
  Administered 2021-07-07 – 2021-07-19 (×43): 4 g via TOPICAL
  Filled 2021-07-07 (×2): qty 100

## 2021-07-07 MED ORDER — ESCITALOPRAM OXALATE 10 MG PO TABS
15.0000 mg | ORAL_TABLET | Freq: Every day | ORAL | Status: DC
  Administered 2021-07-08 – 2021-07-14 (×7): 15 mg via ORAL
  Filled 2021-07-07 (×7): qty 2

## 2021-07-07 NOTE — Plan of Care (Signed)

## 2021-07-07 NOTE — Progress Notes (Signed)
[facility] PROGRESS NOTE  ALIN CHAVIRA AYT:[id number] DOB: 08-16-1991 DOA: 06/17/2021 PCP: Patient, No Pcp Per (Inactive)  Status: Remains inpatient appropriate because:Unsafe d/c plan, IV treatments appropriate due to intensity of illness or inability to take PO, and Inpatient level of care appropriate due to severity of illness  Dispo: The patient is from:  Homeless              Anticipated d/c is to:  Homeless-back to streets per his request              Patient currently is not medically stable to d/c.   Difficult to place patient No    Level of care: Telemetry Medical  Code Status: Full Family Communication: Patient only DVT prophylaxis: SCDs COVID vaccination status: Found to have incidental COVID this admission-asymptomatic-apparently was not immunized prior to admission   HPI: 30 year old male with history of IV drug use, prior heroin overdose in May 2022 comes into the hospital with upper back pain.  This is been going on for the last several days progressively getting worse.  He also reports subjective fevers.  He also smokes tobacco, marijuana and occasionally drinks EtOH.  He uses heroin regularly, last used 2 days prior to admission.  Of note, his brother is also hospitalized with IV drug use related complications.  On admission, he was noted to be incidentally COVID-positive.  MRI on admission showed an epidural abscess, neurosurgery consulted  Subjective: Contact patient.  No specific complaints.  States he was up all night and was very tired and wanted to go back to sleep.  I did tell the patient that tomorrow 9/22 we would be decreasing his Dilaudid frequency to every 8 hour-he verbalized understanding  Objective: Vitals:   07/06/21 2351 07/07/21 0319  BP: 112/75 109/72  Pulse: 85 66  Resp: 20 18  Temp: 97.7 F (36.5 C) 98.1 F (36.7 C)  SpO2: 98% 97%   No intake or output data in the 24 hours ending 07/07/21 0748  Filed Weights   07/05/21 0500  07/06/21 0500 07/07/21 0500  Weight: 68.3 kg 68.8 kg 68.7 kg    Exam: Constitutional: NAD, appears tired, continues to report issues with bicep muscular skeletal strain Respiratory: CTA, RA, no increased work of breathing.  Normal pulse oximetry Cardiovascular: S1-S2, get her pulse, normotensive Abdomen: Soft nontender nondistended with normal active bowel sounds.  LBM 9/19 Neurologic: CN 2-12 grossly intact. Sensation intact, Strength 5/5 x all 4 extremities.  Psychiatric: Normal judgment, continues to lack insight into his addictive behaviors.  Awake and oriented x 3.     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 -MRI on admission c/w soft tissue infection with epidural/paraspinal phlegmon at the C4-C6 level  - 06/18/2021, status post discectomy, thecal sac decompression and epidural abscess debridement, also received intervertebral biomechanical lordotic cage as well as anterior instrumentation with interbody plate per NS -ID following and recommended continue cefepime for 6 weeks beginning at 06/18/2021 (LD DUE 07/30/21) -ID recommended CBC and BMET weekly with ESR and CRP every 2-week (orders placed) -Much better pain control with utilization of OxyContin 40 every 12 hours plus as needed IV Dilaudid.  As of 9/19 slow tapering of IV Dilaudid initiated with dose decreased to every 6 hours prn with plans to further decrease every 48-72 hours -Continue low-dose Oxy IR for breakthrough pain -Continue scheduled ibuprofen, Lyrica and baclofen. -Operative culture positive for Serratia.  New cefepime as recommended by ID -Recent MRI of C-spine completed  due to patient complaining possible radiculopathy symptoms.  Stable compared to previous imaging except for fluid collection with increased prevertebral edema and thickening at C2 and C7 -Discussed with Dr. Juleen China with ID.  CRP trending downward, ESR normal, no leukocytosis, and no fever.  ID does not suspect  active infection -Secure chat sent to neurosurgical team so they are aware of these results    Superficial abscess versus seroma C5/C6 -Patient has been having ongoing neck pain postoperatively but at this juncture given duration since surgery and resolution of fluctuant fluid collection under incision it is expected pain should be minimal at this point -Soft tissue CT neck ordered by NS w/superficial oval 4.1 x 0.4 x 3.6 cm fluid collection along the skin surface on the right suspicious for abscess. NS felt surgery not indicated and recommended continuing antibiotics -ID following   Insomnia/depression -Continue scheduled Ambien HS and Lexapro-some improvement in sleep pattern -When we are weaning the Dilaudid I will increase his dose of Lexapro today 9/21  Heroin, polysubstance abuse in context of chronic pain -Continues to lack insight regarding his substance abuse stating "I do not have an addictive personality".   -Patient wishes to follow-up with methadone clinic after discharge -TTE negative for endocarditis -See above regarding not starting methadone during this hospitalization -Currently we are weaning his IV Dilaudid with next decreased due on 9/22 for frequency of every 8 hours   Covid positive -Asymptomatic -Isolation completed 9/11  Odynophagia -Continue Cepacol spray -Continue elixir medications where appropriate     Data Reviewed: Basic Metabolic Panel: Recent Labs  Lab 07/01/21 0112  NA 138  K 4.7  CL 101  CO2 28  GLUCOSE 108*  BUN 33*  CREATININE 0.72  CALCIUM 9.7   Liver Function Tests: No results for input(s): AST, ALT, ALKPHOS, BILITOT, PROT, ALBUMIN in the last 168 hours.   CBC: Recent Labs  Lab 07/01/21 0112 07/05/21 1345  WBC 8.2 6.8  NEUTROABS 4.1 3.5  HGB 11.7* 12.1*  HCT 35.8* 38.0*  MCV 81.0 81.5  PLT 314 193    Scheduled Meds:  baclofen  10 mg Oral TID   docusate sodium  100 mg Oral BID   enoxaparin (LOVENOX) injection  40 mg  Subcutaneous Q24H   escitalopram  10 mg Oral Daily   ibuprofen  400 mg Oral QID   multivitamin with minerals  1 tablet Oral Daily   oxyCODONE  50 mg Oral Q12H   pregabalin  125 mg Oral TID   Ensure Max Protein  11 oz Oral BID   sodium chloride flush  3 mL Intravenous Q12H   Continuous Infusions:  ceFEPime (MAXIPIME) IV 2 g (07/07/21 0656)    Principal Problem:   Epidural abscess Active Problems:   Heroin abuse ([place])   Homeless   COVID-19 virus infection   Consultants: Neurosurgery Infectious disease  Procedures: 2D echocardiogram Cervical discectomy with debridement of epidural abscess and placement of intervertebral lordotic cage  Antibiotics: Meropenem x1 dose 9/1 Vancomycin IV 9/1 through 9/4 Ceftriaxone 9/2 through 9/4 Cefepime 9/5 >>   Time spent: 35 minutes    Erin Hearing ANP  [facility] 7 am - 330 pm/M-F for direct patient care and secure chat Please refer to Amion for contact info 20  days

## 2021-07-08 LAB — SEDIMENTATION RATE: Sed Rate: 15 mm/hr (ref 0–16)

## 2021-07-08 LAB — BASIC METABOLIC PANEL
Anion gap: 8 (ref 5–15)
BUN: 29 mg/dL — ABNORMAL HIGH (ref 6–20)
CO2: 27 mmol/L (ref 22–32)
Calcium: 9.4 mg/dL (ref 8.9–10.3)
Chloride: 103 mmol/L (ref 98–111)
Creatinine, Ser: 0.72 mg/dL (ref 0.61–1.24)
GFR, Estimated: >60 mL/min (ref >60)
Glucose, Bld: 120 mg/dL — ABNORMAL HIGH (ref 70–99)
Potassium: 4.3 mmol/L (ref 3.5–5.1)
Sodium: 138 mmol/L (ref 135–145)

## 2021-07-08 LAB — CBC WITH DIFFERENTIAL/PLATELET
Abs Immature Granulocytes: 0.09 10*3/uL — ABNORMAL HIGH (ref 0.00–0.07)
Basophils Absolute: 0.1 10*3/uL (ref 0.0–0.1)
Basophils Relative: 1 %
Eosinophils Absolute: 0.3 10*3/uL (ref 0.0–0.5)
Eosinophils Relative: 7 %
HCT: 34.7 % — ABNORMAL LOW (ref 39.0–52.0)
Hemoglobin: 11.7 g/dL — ABNORMAL LOW (ref 13.0–17.0)
Immature Granulocytes: 2 %
Lymphocytes Relative: 32 %
Lymphs Abs: 1.6 10*3/uL (ref 0.7–4.0)
MCH: 27.2 pg (ref 26.0–34.0)
MCHC: 33.7 g/dL (ref 30.0–36.0)
MCV: 80.7 fL (ref 80.0–100.0)
Monocytes Absolute: 0.4 10*3/uL (ref 0.1–1.0)
Monocytes Relative: 9 %
Neutro Abs: 2.4 10*3/uL (ref 1.7–7.7)
Neutrophils Relative %: 49 %
Platelets: 144 10*3/uL — ABNORMAL LOW (ref 150–400)
RBC: 4.3 MIL/uL (ref 4.22–5.81)
RDW: 14.5 % (ref 11.5–15.5)
WBC: 4.9 10*3/uL (ref 4.0–10.5)
nRBC: 0 % (ref 0.0–0.2)

## 2021-07-08 LAB — C-REACTIVE PROTEIN: CRP: 1.7 mg/dL — ABNORMAL HIGH (ref <1.0)

## 2021-07-08 MED ORDER — HYDROMORPHONE HCL 1 MG/ML IJ SOLN
1.0000 mg | Freq: Three times a day (TID) | INTRAMUSCULAR | Status: DC | PRN
Start: 2021-07-08 — End: 2021-07-12
  Administered 2021-07-08 – 2021-07-12 (×11): 1 mg via INTRAVENOUS
  Filled 2021-07-08 (×13): qty 1

## 2021-07-08 NOTE — Progress Notes (Signed)
[facility]  Date of Admission:  06/17/2021           Reason for visit: Follow up on epidural abscess  Current antibiotics: Cefepime 9/5-present  ASSESSMENT:    30 y.o. male admitted with:  Cervical epidural abscess: Status post C5-C6 ACDF (instrumented fusion) on 06/18/2021 with OR cultures that grew Serratia marcescens and he has been on cefepime since 06/21/2021.  Initially followed by Dr.  Elinor Parkinson this admission.  We were re-consulted after patient developed some erythema along the inferior aspect of his incision that showed a superficial fluid collection suspicious for possible abscess.  This was reviewed by neurosurgery who elected for no surgical intervention unless he develops dehiscence, purulent drainage or dysphagia.  An MRI was also obtained 9/20 due to his complaint of numbness and tingling and decreased strength that showed improved epidural enhancement and phlegmonous changes that may be infectious.  Repeat labs have overall been reassuring with improved inflammatory markers and normal WBC.  No further neurosurgery recommendations at this time. Injection drug use: HIV negative and HCV RNA detected at less than 15.  RECOMMENDATIONS:    Continue cefepime per pharmacy. Continue periodic lab monitoring.  Monitor incision for any progression and continue monitoring neurologic status for any new developments. Plan to recheck HCV RNA in 3 months. Continue routine lab monitoring and wound care. Will continue to follow periodically.   Principal Problem:   Epidural abscess Active Problems:   Heroin abuse (HCC)   Homeless   COVID-19 virus infection    MEDICATIONS:    Scheduled Meds:  baclofen  10 mg Oral TID   diclofenac Sodium  4 g Topical QID   docusate sodium  100 mg Oral BID   enoxaparin (LOVENOX) injection  40 mg Subcutaneous Q24H   escitalopram  15 mg Oral Daily   ibuprofen  400 mg Oral QID   multivitamin with minerals  1 tablet Oral  Daily   oxyCODONE  50 mg Oral Q12H   pregabalin  125 mg Oral TID   Ensure Max Protein  11 oz Oral BID   sodium chloride flush  3 mL Intravenous Q12H   Continuous Infusions:  ceFEPime (MAXIPIME) IV 2 g (07/08/21 0630)   PRN Meds:.acetaminophen **OR** acetaminophen, bisacodyl, hydrALAZINE, HYDROmorphone (DILAUDID) injection, ondansetron **OR** ondansetron (ZOFRAN) IV, oxyCODONE, phenol, polyethylene glycol, zolpidem  SUBJECTIVE:   24 hour events:  No acute events overnight other than noted issues regarding pain medication administration Stable inflammatory markers and CBC, BMP this morning No new micro, no new imaging Continues on cefepime for cervical epidural abscess  No new complaints.  Continues to report some left upper extremity discomfort but no progression in symptoms.  Tolerating antibiotics otherwise.  Review of Systems  All other systems reviewed and are negative.    OBJECTIVE:   Blood pressure 118/67, pulse 79, temperature 97.7 F (36.5 C), temperature source Oral, resp. rate 14, height 5\' 9"  (1.753 m), weight 68.6 kg, SpO2 95 %. Body mass index is 22.33 kg/m.  Physical Exam Constitutional:      General: He is not in acute distress.    Appearance: Normal appearance.  HENT:     Head: Normocephalic and atraumatic.  Neck:     Comments: Surgical incision intact without any dehiscence or drainage. Pulmonary:     Effort: Pulmonary effort is normal. No respiratory distress.  Musculoskeletal:     Comments: Moving all 4 of his extremities freely.  Skin:    General:  Skin is warm and dry.     Findings: No rash.  Neurological:     General: No focal deficit present.     Mental Status: He is alert and oriented to person, place, and time.  Psychiatric:        Mood and Affect: Mood normal.        Behavior: Behavior normal.     Lab Results: Lab Results  Component Value Date   WBC 4.9 07/08/2021   HGB 11.7 (L) 07/08/2021   HCT 34.7 (L) 07/08/2021   MCV 80.7  07/08/2021   PLT 144 (L) 07/08/2021    Lab Results  Component Value Date   NA 138 07/08/2021   K 4.3 07/08/2021   CO2 27 07/08/2021   GLUCOSE 120 (H) 07/08/2021   BUN 29 (H) 07/08/2021   CREATININE 0.72 07/08/2021   CALCIUM 9.4 07/08/2021   GFRNONAA >60 07/08/2021   GFRAA >60 05/09/2018    Lab Results  Component Value Date   ALT 9 06/17/2021   AST 13 (L) 06/17/2021   ALKPHOS 83 06/17/2021   BILITOT 0.8 06/17/2021       Component Value Date/Time   CRP 1.7 (H) 07/08/2021 0101       Component Value Date/Time   ESRSEDRATE 15 07/08/2021 0101     I have reviewed the micro and lab results in Epic.  Imaging: No results found.   Imaging independently reviewed in Epic.    Vedia Coffer for Infectious Disease [facility] [phone] pager 07/08/2021, 8:40 AM  I spent greater than 35 minutes with the patient including greater than 50% of time in face to face counsel of the patient and in coordination of their care.

## 2021-07-08 NOTE — Plan of Care (Signed)

## 2021-07-08 NOTE — Progress Notes (Signed)
Patient was insistent that the RN should  Administer Dilaudid before giving PO OXY/IR as ordered. RN verified with pharmacy that [facility] PRN administration should start with OXY/IR first. Dilaudid IV  is to be given only after OXY/IR, one hour later PRN. RN repeated direction back for confirmation and Patient understanding. Patient finally agreed with the orders and took PO meds as ordered.

## 2021-07-08 NOTE — Progress Notes (Signed)
[facility] PROGRESS NOTE  Gerald Oliver KVQ:[id number] DOB: Aug 24, 1991 DOA: 06/17/2021 PCP: Patient, No Pcp Per (Inactive)  Status: Remains inpatient appropriate because:Unsafe d/c plan, IV treatments appropriate due to intensity of illness or inability to take PO, and Inpatient level of care appropriate due to severity of illness  Dispo: The patient is from:  Homeless              Anticipated d/c is to:  Homeless-back to streets per his request              Patient currently is not medically stable to d/c.   Difficult to place patient No    Level of care: Telemetry Medical  Code Status: Full Family Communication: Patient only DVT prophylaxis: SCDs COVID vaccination status: Found to have incidental COVID this admission-asymptomatic-apparently was not immunized prior to admission   HPI: 30 year old male with history of IV drug use, prior heroin overdose in May 2022 comes into the hospital with upper back pain.  This is been going on for the last several days progressively getting worse.  He also reports subjective fevers.  He also smokes tobacco, marijuana and occasionally drinks EtOH.  He uses heroin regularly, last used 2 days prior to admission.  Of note, his brother is also hospitalized with IV drug use related complications.  On admission, he was noted to be incidentally COVID-positive.  MRI on admission showed an epidural abscess, neurosurgery consulted  Subjective: Patient lying on his stomach in bed.  I walked in the room and began examining his lungs.  He did not realize that it was may and grumbly asked "what do you want".  Once he realized it was me his affect changed to more pleasant.  Complaining of posterior neck pain.  States on discomfort better with Voltaren gel  Objective: Vitals:   07/07/21 2358 07/08/21 0409  BP: 119/70 111/69  Pulse: 69 64  Resp: 18 19  Temp: 97.7 F (36.5 C) 97.6 F (36.4 C)  SpO2: 98% 95%   No intake or output data in the 24 hours  ending 07/08/21 0751  Filed Weights   07/06/21 0500 07/07/21 0500 07/08/21 0409  Weight: 68.8 kg 68.7 kg 68.6 kg    Exam: Constitutional: NAD, calm Respiratory: Posterior lung sounds are clear to auscultation, no increased work of breathing, laying supine in bed, room air. Cardiovascular: Normal heart sounds, normotensive, adequate capillary refill Abdomen: Soft nontender nondistended with normal active bowel sounds.  LBM 9/20 Neurologic: CN 2-12 grossly intact. Sensation intact, Strength 5/5 x all 4 extremities.  Psychiatric: Normal judgment, continues to lack insight into his addictive behaviors.  Awake and oriented x 3.     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  9/22 frequency decreased to q 8 hrs prn -Continue Oxy IR for breakthrough pain -Continue scheduled ibuprofen, Lyrica and baclofen. -Operative culture positive for Serratia.   -Recent MRI of C-spine completed due to patient complaining possible radiculopathy symptoms.  Stable compared to previous imaging except for fluid collection with increased prevertebral edema and thickening at  C2 and C7 -Discussed with Dr. Juleen China with ID.  CRP trending downward, ESR normal, no leukocytosis, and no fever.  ID does not suspect active infection -Secure chat sent to neurosurgical team-NP replied and stated she would make Dr. Kathyrn Sheriff aware     Superficial abscess versus seroma C5/C6 -Patient has been having ongoing  neck pain postoperatively but at this juncture given duration since surgery and resolution of fluctuant fluid collection under incision it is expected pain should be minimal at this point -Soft tissue CT neck ordered by NS w/superficial oval 4.1 x 0.4 x 3.6 cm fluid collection along the skin surface on the right suspicious for abscess. NS felt surgery not indicated and recommended continuing antibiotics -ID following   Insomnia/depression -Continue scheduled Ambien HS and Lexapro-some improvement in sleep pattern -When we are weaning the Dilaudid I will increase his dose of Lexapro today 9/21  Heroin, polysubstance abuse in context of chronic pain -Continues to lack insight regarding his substance abuse stating "I do not have an addictive personality".   -Patient wishes to follow-up with methadone clinic after discharge -TTE negative for endocarditis -See above regarding not starting methadone during this hospitalization -Currently we are weaning his IV Dilaudid-decreased 9/22 to every 8 hours prn   Covid positive -Asymptomatic -Isolation completed 9/11  Odynophagia -Continue Cepacol spray -Continue elixir medications where appropriate     Data Reviewed: Basic Metabolic Panel: Recent Labs  Lab 07/08/21 0101  NA 138  K 4.3  CL 103  CO2 27  GLUCOSE 120*  BUN 29*  CREATININE 0.72  CALCIUM 9.4   Liver Function Tests: No results for input(s): AST, ALT, ALKPHOS, BILITOT, PROT, ALBUMIN in the last 168 hours.   CBC: Recent Labs  Lab 07/05/21 1345 07/08/21 0101  WBC 6.8 4.9  NEUTROABS 3.5 2.4  HGB 12.1* 11.7*  HCT 38.0* 34.7*  MCV 81.5 80.7  PLT 193 144*    Scheduled Meds:  baclofen  10 mg Oral TID   diclofenac Sodium  4 g Topical QID   docusate sodium  100 mg Oral BID   enoxaparin (LOVENOX) injection  40 mg Subcutaneous Q24H   escitalopram  15 mg Oral Daily   ibuprofen  400 mg Oral QID   multivitamin with minerals  1 tablet Oral Daily   oxyCODONE  50 mg Oral  Q12H   pregabalin  125 mg Oral TID   Ensure Max Protein  11 oz Oral BID   sodium chloride flush  3 mL Intravenous Q12H   Continuous Infusions:  ceFEPime (MAXIPIME) IV 2 g (07/08/21 0630)    Principal Problem:   Epidural abscess Active Problems:   Heroin abuse ([place])   Homeless   COVID-19 virus infection   Consultants: Neurosurgery Infectious disease  Procedures: 2D echocardiogram Cervical discectomy with debridement of epidural abscess and placement of intervertebral lordotic cage  Antibiotics: Meropenem x1 dose 9/1 Vancomycin IV 9/1 through 9/4 Ceftriaxone 9/2 through 9/4 Cefepime 9/5 >>   Time spent: 35 minutes    Erin Hearing ANP  [facility] 7 am - 330 pm/M-F for direct patient care and secure chat Please refer to Amion for contact info 21  days

## 2021-07-09 NOTE — Progress Notes (Signed)
[facility] PROGRESS NOTE  Gerald Oliver [id number] DOB: Apr 19, 1991 DOA: 06/17/2021 PCP: Patient, No Pcp Per (Inactive)  Status: Remains inpatient appropriate because:Unsafe d/c plan, IV treatments appropriate due to intensity of illness or inability to take PO, and Inpatient level of care appropriate due to severity of illness  Dispo: The patient is from:  Homeless              Anticipated d/c is to:  Homeless-back to streets per his request              Patient currently is not medically stable to d/c.   Difficult to place patient No    Level of care: Telemetry Medical  Code Status: Full Family Communication: Patient only DVT prophylaxis: SCDs COVID vaccination status: Found to have incidental COVID this admission-asymptomatic-apparently was not immunized prior to admission   HPI: 30 year old male with history of IV drug use, prior heroin overdose in May 2022 comes into the hospital with upper back pain.  This is been going on for the last several days progressively getting worse.  He also reports subjective fevers.  He also smokes tobacco, marijuana and occasionally drinks EtOH.  He uses heroin regularly, last used 2 days prior to admission.  Of note, his brother is also hospitalized with IV drug use related complications.  On admission, he was noted to be incidentally COVID-positive.  MRI on admission showed an epidural abscess, neurosurgery consulted  Subjective: Leaping on stomach but easily awakened.  Tells me that he feels like he is going through withdrawals with the decrease frequency of Dilaudid dosing.  Explained to him that we need to get rid of the Ativan and will eventually need to start tapering the OxyContin since he will only be on short acting oxycodone when he is discharged.  Patient verbalized understanding.  I did tell him I would not return to weaning the Dilaudid again until Monday when I return.  Objective: Vitals:   07/09/21 0008 07/09/21 0449  BP:  103/63 122/70  Pulse: 63 81  Resp: 16 17  Temp: 97.8 F (36.6 C) 97.6 F (36.4 C)  SpO2: 98% 98%   No intake or output data in the 24 hours ending 07/09/21 0745  Filed Weights   07/07/21 0500 07/08/21 0409 07/09/21 0008  Weight: 68.7 kg 68.6 kg 68.3 kg    Exam: Constitutional: NAD, calm Respiratory: Lung sounds clear with posterior auscultation, no increased work of breathing.  Room air. Cardiovascular: S1-S2, normotensive, no peripheral edema Abdomen: Soft nontender nondistended with normal active bowel sounds.  LBM 9/21 Neurologic: CN 2-12 grossly intact. Sensation intact, Strength 5/5 x all 4 extremities.  Psychiatric: Normal judgment, continues to lack insight into his addictive behaviors.  Awake and oriented x 3.     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  9/22 frequency decreased to q 8 hrs prn -Continue Oxy IR for breakthrough pain -Continue scheduled ibuprofen, Lyrica and baclofen. -Operative culture positive for Serratia.   -Recent MRI of C-spine completed due to patient complaining possible radiculopathy symptoms.  Stable compared to previous imaging except for fluid collection with increased prevertebral edema and thickening at C2 and C7 -Discussed with Dr. Juleen China with ID.  CRP trending downward, ESR normal, no leukocytosis, and no fever.  ID does not suspect active infection -Secure chat sent to neurosurgical team-NP replied and stated she would  make Dr. Kathyrn Sheriff aware -no additional neurosurgical recommendations    Superficial abscess versus seroma C5/C6 -Patient has been having ongoing neck pain postoperatively but at this juncture given duration since surgery and resolution of fluctuant fluid collection under incision it is expected pain should be minimal at this point -Soft tissue CT neck ordered by NS w/superficial oval 4.1 x 0.4 x 3.6 cm fluid collection along the skin surface on the right suspicious for abscess. NS felt surgery not indicated and recommended continuing antibiotics -ID following   Insomnia/depression -Continue scheduled Ambien HS and Lexapro-some improvement in sleep pattern -Lexapro dose increased 9/21  Heroin, polysubstance abuse in context of chronic pain -Continues to lack insight regarding his substance abuse stating "I do not have an addictive personality".   -Patient wishes to follow-up with methadone clinic after discharge -TTE negative for endocarditis -See above regarding not starting methadone during this hospitalization -Currently we are weaning his IV Dilaudid-decreased 9/22 to every 8 hours prn   Covid positive -Asymptomatic -Isolation completed 9/11  Odynophagia -Continue Cepacol spray -Continue elixir medications where appropriate     Data Reviewed: Basic Metabolic Panel: Recent Labs  Lab 07/08/21 0101  NA 138  K 4.3  CL 103  CO2 27  GLUCOSE 120*  BUN 29*  CREATININE 0.72  CALCIUM 9.4   Liver Function Tests: No results for input(s): AST, ALT, ALKPHOS, BILITOT, PROT, ALBUMIN in the last 168 hours.   CBC: Recent Labs  Lab 07/05/21 1345 07/08/21 0101  WBC 6.8 4.9  NEUTROABS 3.5 2.4  HGB 12.1* 11.7*  HCT 38.0* 34.7*  MCV 81.5 80.7  PLT 193 144*    Scheduled Meds:  baclofen  10 mg Oral TID   diclofenac Sodium  4 g Topical QID   docusate sodium  100 mg Oral BID   enoxaparin (LOVENOX) injection  40 mg Subcutaneous Q24H   escitalopram  15 mg Oral Daily    ibuprofen  400 mg Oral QID   multivitamin with minerals  1 tablet Oral Daily   oxyCODONE  50 mg Oral Q12H   pregabalin  125 mg Oral TID   Ensure Max Protein  11 oz Oral BID   sodium chloride flush  3 mL Intravenous Q12H   Continuous Infusions:  ceFEPime (MAXIPIME) IV 2 g (07/09/21 0644)    Principal Problem:   Epidural abscess Active Problems:   Heroin abuse ([place])   Homeless   COVID-19 virus infection   Consultants: Neurosurgery Infectious disease  Procedures: 2D echocardiogram Cervical discectomy with debridement of epidural abscess and placement of intervertebral lordotic cage  Antibiotics: Meropenem x1 dose 9/1 Vancomycin IV 9/1 through 9/4 Ceftriaxone 9/2 through 9/4 Cefepime 9/5 >>   Time spent: 35 minutes    Erin Hearing ANP  [facility] 7 am - 330 pm/M-F for direct patient care and secure chat Please refer to Amion for contact info 22  days

## 2021-07-10 NOTE — Progress Notes (Signed)
PROGRESS NOTE    Gerald Oliver  KZL:[id number] DOB: 1991-03-16 DOA: 06/17/2021 PCP: Patient, No Pcp Per (Inactive)   Brief Narrative: 30 year old past medical history IV drug use, prior heroin overdose May 2022 presents to the hospital with upper back pain.  Subjective fever.  He uses heroin regularly.  MRI on admission show an epidural abscess, neurosurgery was consulted ID also has been following.    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 ID recommended cefepime for 6 weeks, last dose 07/30/2021 Recent MRI of C-spine stable.  Superficial abscess versus seroma C5-C6: Soft tissues CT neck ordered by neurosurgery showed superficial oval 4.1 x 3.6 fluid collection along the skin surveys.  Neurosurgery recommended to continue with IV antibiotics  Heroine polysubstance abuse in the context of chronic pain: TEE was negative for endocarditis. Weaning IV Dilaudid.  COVID-positive: Asymptomatic, completed isolation 9/11   Nutrition Problem: Increased nutrient needs Etiology: acute illness (epidural abscess)    Signs/Symptoms: estimated needs    Interventions: MVI, Other (Comment) (Ensure Max)  Estimated body mass index is 22.24 kg/m as calculated from the following:   Height as of this encounter: 5\' 9"  (1.753 m).   Weight as of this encounter: 68.3 kg.   DVT prophylaxis: Lovenox Code Status: Full code Family Communication: Discussed with patient Disposition Plan:  Status is: Inpatient  Remains inpatient appropriate because:IV treatments appropriate due to intensity of illness or inability to take PO  Dispo: The  patient is from: Home              Anticipated d/c is to: Home              Patient currently is not medically stable to d/c.   Difficult to place patient No        Consultants:  Neurosurgery ID   Subjective: Patient is alert, he reported that he went to bed really late last night watching TV.  His pain is controlled.  He does not have any new complaints.  Objective: Vitals:   07/09/21 1621 07/09/21 1942 07/09/21 2308 07/10/21 0328  BP: 112/68 104/62 112/79 (!) 118/103  Pulse: 67 66 63 77  Resp: 16 18 20  (!) 22  Temp: 98.3 F (36.8 C) 98 F (36.7 C) 97.6 F (36.4 C) 98.6 F (37 C)  TempSrc: Oral Oral Oral Oral  SpO2: 98% 98% 100% 99%  Weight:      Height:        Intake/Output Summary (Last 24 hours) at 07/10/2021 0727 Last data filed at 07/09/2021 1700 Gross per 24 hour  Intake 820 ml  Output --  Net 820 ml   Filed Weights   07/07/21 0500 07/08/21 0409 07/09/21 0008  Weight: 68.7 kg 68.6 kg 68.3 kg    Examination:  General exam: Appears calm and comfortable, anterior neck incision looks improved a few weeks ago Respiratory system: Clear to auscultation. Respiratory effort normal. Cardiovascular system: S1 & S2 heard, RRR. No JVD, murmurs, rubs, gallops or clicks. No pedal edema. Gastrointestinal system: Abdomen is nondistended, soft and nontender. No organomegaly or masses felt. Normal bowel sounds heard. Central nervous system: Alert and oriented. Extremities: no edema  Data Reviewed: I have personally  reviewed following labs and imaging studies  CBC: Recent Labs  Lab 07/05/21 1345 07/08/21 0101  WBC 6.8 4.9  NEUTROABS 3.5 2.4  HGB 12.1* 11.7*  HCT 38.0* 34.7*  MCV 81.5 80.7  PLT 193 144*   Basic Metabolic Panel: Recent Labs  Lab 07/08/21 0101  NA 138  K 4.3  CL 103  CO2 27  GLUCOSE 120*  BUN 29*  CREATININE 0.72  CALCIUM 9.4   GFR: Estimated Creatinine Clearance: 128.1 mL/min (by C-G formula based on SCr of 0.72 mg/dL). Liver  Function Tests: No results for input(s): AST, ALT, ALKPHOS, BILITOT, PROT, ALBUMIN in the last 168 hours. No results for input(s): LIPASE, AMYLASE in the last 168 hours. No results for input(s): AMMONIA in the last 168 hours. Coagulation Profile: No results for input(s): INR, PROTIME in the last 168 hours. Cardiac Enzymes: No results for input(s): CKTOTAL, CKMB, CKMBINDEX, TROPONINI in the last 168 hours. BNP (last 3 results) No results for input(s): PROBNP in the last 8760 hours. HbA1C: No results for input(s): HGBA1C in the last 72 hours. CBG: No results for input(s): GLUCAP in the last 168 hours. Lipid Profile: No results for input(s): CHOL, HDL, LDLCALC, TRIG, CHOLHDL, LDLDIRECT in the last 72 hours. Thyroid Function Tests: No results for input(s): TSH, T4TOTAL, FREET4, T3FREE, THYROIDAB in the last 72 hours. Anemia Panel: No results for input(s): VITAMINB12, FOLATE, FERRITIN, TIBC, IRON, RETICCTPCT in the last 72 hours. Sepsis Labs: No results for input(s): PROCALCITON, LATICACIDVEN in the last 168 hours.  No results found for this or any previous visit (from the past 240 hour(s)).       Radiology Studies: No results found.      Scheduled Meds:  baclofen  10 mg Oral TID   diclofenac Sodium  4 g Topical QID   docusate sodium  100 mg Oral BID   enoxaparin (LOVENOX) injection  40 mg Subcutaneous Q24H   escitalopram  15 mg Oral Daily   ibuprofen  400 mg Oral QID   multivitamin with minerals  1 tablet Oral Daily   oxyCODONE  50 mg Oral Q12H   pregabalin  125 mg Oral TID   Ensure Max Protein  11 oz Oral BID   sodium chloride flush  3 mL Intravenous Q12H   Continuous Infusions:  ceFEPime (MAXIPIME) IV 2 g (07/10/21 0616)     LOS: 23 days    Time spent: 35 Minutes.     Alba Cory, MD [facility]   If 7PM-7AM, please contact night-coverage [email or web address]  07/10/2021, 7:27 AM

## 2021-07-10 NOTE — Progress Notes (Signed)
Patient refused AM weight. 

## 2021-07-11 NOTE — Progress Notes (Signed)
PROGRESS NOTE    Gerald Oliver  HCW:[id number] DOB: [birth year] DOA: 06/17/2021 PCP: Patient, No Pcp Per (Inactive)   Brief Narrative: 30 year old past medical history IV drug use, prior heroin overdose May 2022 presents to the hospital with upper back pain.  Subjective fever.  He uses heroin regularly.  MRI on admission show an epidural abscess, neurosurgery was consulted ID also has been following.    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 ID recommended cefepime for 6 weeks, last dose 07/30/2021 Recent MRI of C-spine stable. Continue with Cefepime.   Superficial abscess versus seroma C5-C6: Soft tissues CT neck ordered by neurosurgery showed superficial oval 4.1 x 3.6 fluid collection along the skin surveys.  Neurosurgery recommended to continue with IV antibiotics  Heroine polysubstance abuse in the context of chronic pain: TEE was negative for endocarditis. Weaning IV Dilaudid. Defer to team on Monday   COVID-positive: Asymptomatic, completed isolation 9/11   Nutrition Problem: Increased nutrient needs Etiology: acute illness (epidural abscess)    Signs/Symptoms: estimated needs    Interventions: MVI, Other (Comment) (Ensure Max)  Estimated body mass index is 22.85 kg/m as calculated from the following:   Height as of this encounter: 5\' 9"  (1.753 m).   Weight as of this encounter: 70.2 kg.   DVT prophylaxis: Lovenox Code Status: Full code Family Communication: Discussed with patient Disposition Plan:  Status is: Inpatient  Remains inpatient appropriate because:IV treatments appropriate due to  intensity of illness or inability to take PO  Dispo: The patient is from: Home              Anticipated d/c is to: Home              Patient currently is not medically stable to d/c.   Difficult to place patient No        Consultants:  Neurosurgery ID   Subjective: He needs to use bathroom no ne complaint.   Objective: Vitals:   07/10/21 2002 07/11/21 0003 07/11/21 0500 07/11/21 0850  BP: 115/72 113/70  111/60  Pulse: 75 71  73  Resp: 17 17  20   Temp: 98.2 F (36.8 C) 97.6 F (36.4 C)  97.8 F (36.6 C)  TempSrc: Oral Oral  Oral  SpO2: 98% 95%  99%  Weight:   70.2 kg   Height:        Intake/Output Summary (Last 24 hours) at 07/11/2021 1110 Last data filed at 07/10/2021 2142 Gross per 24 hour  Intake 3 ml  Output --  Net 3 ml    Filed Weights   07/08/21 0409 07/09/21 0008 07/11/21 0500  Weight: 68.6 kg 68.3 kg 70.2 kg    Examination:  General exam: Alert Respiratory system: Normal Respiratory effort Central nervous system: alert.   Data Reviewed: I have personally reviewed following labs and imaging studies  CBC: Recent Labs  Lab 07/05/21 1345 07/08/21 0101  WBC 6.8 4.9  NEUTROABS 3.5 2.4  HGB 12.1* 11.7*  HCT 38.0* 34.7*  MCV 81.5 80.7  PLT 193 144*    Basic Metabolic Panel: Recent Labs  Lab 07/08/21 0101  NA 138  K 4.3  CL 103  CO2 27  GLUCOSE  120*  BUN 29*  CREATININE 0.72  CALCIUM 9.4    GFR: Estimated Creatinine Clearance: 131.6 mL/min (by C-G formula based on SCr of 0.72 mg/dL). Liver Function Tests: No results for input(s): AST, ALT, ALKPHOS, BILITOT, PROT, ALBUMIN in the last 168 hours. No results for input(s): LIPASE, AMYLASE in the last 168 hours. No results for input(s): AMMONIA in the last 168 hours. Coagulation Profile: No results for input(s): INR, PROTIME in the last 168 hours. Cardiac Enzymes: No results for input(s): CKTOTAL, CKMB, CKMBINDEX, TROPONINI in the last 168 hours. BNP (last 3 results) No results  for input(s): PROBNP in the last 8760 hours. HbA1C: No results for input(s): HGBA1C in the last 72 hours. CBG: No results for input(s): GLUCAP in the last 168 hours. Lipid Profile: No results for input(s): CHOL, HDL, LDLCALC, TRIG, CHOLHDL, LDLDIRECT in the last 72 hours. Thyroid Function Tests: No results for input(s): TSH, T4TOTAL, FREET4, T3FREE, THYROIDAB in the last 72 hours. Anemia Panel: No results for input(s): VITAMINB12, FOLATE, FERRITIN, TIBC, IRON, RETICCTPCT in the last 72 hours. Sepsis Labs: No results for input(s): PROCALCITON, LATICACIDVEN in the last 168 hours.  No results found for this or any previous visit (from the past 240 hour(s)).       Radiology Studies: No results found.      Scheduled Meds:  baclofen  10 mg Oral TID   diclofenac Sodium  4 g Topical QID   docusate sodium  100 mg Oral BID   enoxaparin (LOVENOX) injection  40 mg Subcutaneous Q24H   escitalopram  15 mg Oral Daily   ibuprofen  400 mg Oral QID   multivitamin with minerals  1 tablet Oral Daily   oxyCODONE  50 mg Oral Q12H   pregabalin  125 mg Oral TID   Ensure Max Protein  11 oz Oral BID   sodium chloride flush  3 mL Intravenous Q12H   Continuous Infusions:  ceFEPime (MAXIPIME) IV 2 g (07/11/21 0606)     LOS: 24 days    Time spent: 35 Minutes.     Alba Cory, MD [facility]   If 7PM-7AM, please contact night-coverage [email or web address]  07/11/2021, 11:10 AM

## 2021-07-12 MED ORDER — HYDROMORPHONE HCL 1 MG/ML IJ SOLN
1.0000 mg | Freq: Two times a day (BID) | INTRAMUSCULAR | Status: DC | PRN
  Administered 2021-07-12 – 2021-07-15 (×6): 1 mg via INTRAVENOUS
  Filled 2021-07-12 (×6): qty 1

## 2021-07-12 MED ORDER — OXYCODONE HCL ER 15 MG PO T12A
40.0000 mg | EXTENDED_RELEASE_TABLET | Freq: Two times a day (BID) | ORAL | Status: DC
  Filled 2021-07-12: qty 2

## 2021-07-12 MED ORDER — BUPROPION HCL 100 MG PO TABS
100.0000 mg | ORAL_TABLET | Freq: Two times a day (BID) | ORAL | Status: DC
  Administered 2021-07-12 – 2021-07-14 (×6): 100 mg via ORAL
  Filled 2021-07-12 (×7): qty 1

## 2021-07-12 MED ORDER — OXYCODONE HCL 5 MG PO TABS
10.0000 mg | ORAL_TABLET | ORAL | Status: DC
  Administered 2021-07-12 – 2021-07-14 (×12): 10 mg via ORAL
  Filled 2021-07-12 (×12): qty 2

## 2021-07-12 NOTE — Progress Notes (Signed)
[facility] PROGRESS NOTE  Gerald Oliver ZTI:[id number] DOB: 1991-07-16 DOA: 06/17/2021 PCP: Patient, No Pcp Per (Inactive)  Status: Remains inpatient appropriate because:Unsafe d/c plan, IV treatments appropriate due to intensity of illness or inability to take PO, and Inpatient level of care appropriate due to severity of illness  Dispo: The patient is from:  Homeless              Anticipated d/c is to:  Homeless-back to streets per his request              Patient currently is not medically stable to d/c.   Difficult to place patient No    Level of care: Telemetry Medical  Code Status: Full Family Communication: Patient only DVT prophylaxis: SCDs COVID vaccination status: Found to have incidental COVID this admission-asymptomatic-apparently was not immunized prior to admission   HPI: 30 year old male with history of IV drug use, prior heroin overdose in May 2022 comes into the hospital with upper back pain.  This is been going on for the last several days progressively getting worse.  He also reports subjective fevers.  He also smokes tobacco, marijuana and occasionally drinks EtOH.  He uses heroin regularly, last used 2 days prior to admission.  Of note, his brother is also hospitalized with IV drug use related complications.  On admission, he was noted to be incidentally COVID-positive.  MRI on admission showed an epidural abscess, neurosurgery consulted  Subjective: Patient laying on his abdomen in bed.  Noted with significant flat affect.  Stating he is very depressed.  We discussed how narcotics can contribute to this.  He is aware that the IV Dilaudid has been decreased to every 12 hours today and will decrease to bedtime dosing only starting next Monday.  He requested that I discontinue the OxyContin in favor of utilizing current Oxy IR on a scheduled basis  Objective: Vitals:   07/12/21 0036 07/12/21 0411  BP: 117/77 101/62  Pulse: 64 (!) 57  Resp: 18 18  Temp: 98  F (36.7 C) 97.8 F (36.6 C)  SpO2: 100% 100%    Intake/Output Summary (Last 24 hours) at 07/12/2021 0748 Last data filed at 07/11/2021 2229 Gross per 24 hour  Intake 3 ml  Output --  Net 3 ml    Filed Weights   07/09/21 0008 07/11/21 0500 07/12/21 0500  Weight: 68.3 kg 70.2 kg 69.1 kg    Exam: Constitutional: NAD, flat affect, peers distress Respiratory: Posterior lung sounds clear to auscultation, stable on room air with no increased work of breathing while supine Cardiovascular: Skin warm and dry with adequate capillary refill, normotensive, normal heart sounds Abdomen: Soft nontender nondistended with normal active bowel sounds.  LBM 9/23-appetite documented is good and patient appears to be eating 100% of meals Neurologic: CN 2-12 grossly intact. Sensation intact, Strength 5/5 x all 4 extremities.  Psychiatric: Normal judgment, continues to lack insight into his addictive behaviors.  Awake and oriented x 3.  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 -Recent MRI of  C-spine completed due to patient complaining possible radiculopathy symptoms.  Stable compared to previous imaging with a fluid collection consistent with likely seroma at C2 and C7 -no additional recommendations from neurosurgery -Discussed with Dr. Juleen China with ID.  CRP trending downward, ESR normal, no leukocytosis, and no fever.  ID does not suspect active  infection   Superficial abscess versus seroma C5/C6 -Soft tissue imaging revealed a fluid collection.  Both ID and neurosurgery reviewed and felt this was not consistent with an infectious process  Insomnia/depression -Continue scheduled Ambien HS and Lexapro-some improvement in sleep pattern -Lexapro dose increased 9/21 -9/26 we will add Wellbutrin 100 mg BID  Heroin, polysubstance abuse in context of chronic pain -Continues to lack insight regarding his substance abuse  -Patient wishes to follow-up with methadone clinic after discharge -Continue weaning of narcotics as above   Covid positive -Asymptomatic -Isolation completed 9/11  Odynophagia -Resolved   Data Reviewed: Basic Metabolic Panel: Recent Labs  Lab 07/08/21 0101  NA 138  K 4.3  CL 103  CO2 27  GLUCOSE 120*  BUN 29*  CREATININE 0.72  CALCIUM 9.4   Liver Function Tests: No results for input(s): AST, ALT, ALKPHOS, BILITOT, PROT, ALBUMIN in the last 168 hours.   CBC: Recent Labs  Lab 07/05/21 1345 07/08/21 0101  WBC 6.8 4.9  NEUTROABS 3.5 2.4  HGB 12.1* 11.7*  HCT 38.0* 34.7*  MCV 81.5 80.7  PLT 193 144*    Scheduled Meds:  baclofen  10 mg Oral TID   diclofenac Sodium  4 g Topical QID   docusate sodium  100 mg Oral BID   enoxaparin (LOVENOX) injection  40 mg Subcutaneous Q24H   escitalopram  15 mg Oral Daily   ibuprofen  400 mg Oral QID   multivitamin with minerals  1 tablet Oral Daily   oxyCODONE  40 mg Oral Q12H   pregabalin  125 mg Oral TID   Ensure Max Protein  11 oz Oral BID   sodium chloride flush  3 mL Intravenous Q12H   Continuous Infusions:  ceFEPime (MAXIPIME) IV 2 g (07/12/21 0622)    Principal Problem:   Epidural abscess Active Problems:   Heroin abuse ([place])   Homeless   COVID-19 virus infection   Consultants: Neurosurgery Infectious disease  Procedures: 2D echocardiogram Cervical discectomy with debridement of epidural abscess and placement of intervertebral  lordotic cage  Antibiotics: Meropenem x1 dose 9/1 Vancomycin IV 9/1 through 9/4 Ceftriaxone 9/2 through 9/4 Cefepime 9/5 >>   Time spent: 25 minutes    Erin Hearing ANP  [facility] 7 am - 330 pm/M-F for direct patient care and secure chat Please refer to Amion for contact info 25  days

## 2021-07-12 NOTE — Progress Notes (Signed)
    [facility]    Date of Admission:  06/17/2021   Total days of antibiotics 24          ID: Gerald Oliver is a 30 y.o. male with  serratia with Principal Problem:   Epidural abscess Active Problems:   Heroin abuse (HCC)   Homeless   COVID-19 virus infection    Subjective: Neck swelling improved no erythema or surgical site swelling.afebrile  Medications:   baclofen  10 mg Oral TID   buPROPion  100 mg Oral BID   diclofenac Sodium  4 g Topical QID   docusate sodium  100 mg Oral BID   enoxaparin (LOVENOX) injection  40 mg Subcutaneous Q24H   escitalopram  15 mg Oral Daily   ibuprofen  400 mg Oral QID   multivitamin with minerals  1 tablet Oral Daily   oxyCODONE  10 mg Oral Q4H   pregabalin  125 mg Oral TID   Ensure Max Protein  11 oz Oral BID   sodium chloride flush  3 mL Intravenous Q12H    Objective: Vital signs in last 24 hours: Temp:  [97.6 F (36.4 C)-98 F (36.7 C)] 97.6 F (36.4 C) (09/26 1128) Pulse Rate:  [57-72] 60 (09/26 1128) Resp:  [18] 18 (09/26 1128) BP: (101-126)/(62-81) 109/69 (09/26 1128) SpO2:  [95 %-100 %] 100 % (09/26 1128) Weight:  [69.1 kg] 69.1 kg (09/26 0500)  Physical Exam  Constitutional: He is oriented to person, place, and time. He appears well-developed and well-nourished. No distress.  HENT:  Mouth/Throat: Oropharynx is clear and moist. No oropharyngeal exudate. Anterior incision is c/d/i Cardiovascular: Normal rate, regular rhythm and normal heart sounds. Exam reveals no gallop and no friction rub.  No murmur heard.  Pulmonary/Chest: Effort normal and breath sounds normal. No respiratory distress. He has no wheezes.  Lymphadenopathy:  He has no cervical adenopathy.  Neurological: He is alert and oriented to person, place, and time.  Skin: Skin is warm and dry. No rash noted. No erythema.  Psychiatric: He has a normal mood and affect. His behavior is normal.   Lab Results Lab Results  Component Value  Date   WBC 4.9 07/08/2021   HGB 11.7 (L) 07/08/2021   HCT 34.7 (L) 07/08/2021   MCV 80.7 07/08/2021   PLT 144 (L) 07/08/2021   Lab Results  Component Value Date   ESRSEDRATE 15 07/08/2021     Microbiology: 9/2 serratia Studies/Results: No results found.   Assessment/Plan: Serratia ventral epidural cervical abscess s/p ADCF = plan for 6 wk of Iv abtx, currently on day 24 of 42 of cefepime.  Anterior surgical neck incision = healing  IV drug use - remains HIV , HCVnegative.  [facility] Pager: [phone]  07/12/2021, 3:02 PM

## 2021-07-13 MED ORDER — CYCLOBENZAPRINE HCL 5 MG PO TABS
7.5000 mg | ORAL_TABLET | Freq: Three times a day (TID) | ORAL | Status: DC
  Administered 2021-07-13 – 2021-07-14 (×6): 7.5 mg via ORAL
  Filled 2021-07-13 (×7): qty 1.5

## 2021-07-13 NOTE — Progress Notes (Signed)
[facility] PROGRESS NOTE  Gerald Oliver DPO:[id number] DOB: Jun 13, 1991 DOA: 06/17/2021 PCP: Patient, No Pcp Per (Inactive)  Status: Remains inpatient appropriate because:Unsafe d/c plan, IV treatments appropriate due to intensity of illness or inability to take PO, and Inpatient level of care appropriate due to severity of illness  Dispo: The patient is from:  Homeless              Anticipated d/c is to:  Homeless-back to streets per his request              Patient currently is not medically stable to d/c.   Difficult to place patient No    Level of care: Telemetry Medical  Code Status: Full Family Communication: Patient only DVT prophylaxis: SCDs COVID vaccination status: Found to have incidental COVID this admission-asymptomatic-apparently was not immunized prior to admission   HPI: 30 year old male with history of IV drug use, prior heroin overdose in May 2022 comes into the hospital with upper back pain.  This is been going on for the last several days progressively getting worse.  He also reports subjective fevers.  He also smokes tobacco, marijuana and occasionally drinks EtOH.  He uses heroin regularly, last used 2 days prior to admission.  Of note, his brother is also hospitalized with IV drug use related complications.  On admission, he was noted to be incidentally COVID-positive.  MRI on admission showed an epidural abscess, neurosurgery consulted  Subjective: Awakened.  Complaining of muscle spasms in his neck.  Discussed plan to change to a different muscle relaxer.  No other complaints verbalized.  Objective: Vitals:   07/13/21 0044 07/13/21 0602  BP: 113/67 116/61  Pulse: 64 66  Resp: 20 18  Temp: 97.7 F (36.5 C) 97.8 F (36.6 C)  SpO2: 93% 100%   No intake or output data in the 24 hours ending 07/13/21 0757   Filed Weights   07/09/21 0008 07/11/21 0500 07/12/21 0500  Weight: 68.3 kg 70.2 kg 69.1 kg    Exam: Constitutional: NAD, flat affect,  appears depressed Respiratory: Lung sounds on posterior examination are clear, room air, no increased work of breathing while laying supine on left side. Cardiovascular: S1-S2, normotensive, regular pulse, skin warm and dry with appropriate capillary refill Abdomen: Soft nontender nondistended with normal active bowel sounds.  LBM 9/23-eating well Neurologic: CN 2-12 grossly intact. Sensation intact, Strength 5/5 x all 4 extremities.  Psychiatric: Awake and oriented x 3.  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  Discontinue baclofen in favor of Flexeril. -Recent MRI of C-spine with a fluid collection c/w seroma at C2 and C7 -no additional recommendations from neurosurgery -Discussed with Dr. Juleen China with ID and agrees this does not fit infectious process with labs supporting this assessment   Superficial abscess versus seroma C5/C6 -Soft tissue imaging revealed a fluid collection.  Both ID and neurosurgery reviewed and felt this was not consistent with an infectious process  Insomnia/depression -Continue scheduled Ambien HS and Lexapro-some improvement in sleep pattern -Lexapro dose increased 9/21 -9/26 we will add Wellbutrin 100 mg BID  Heroin, polysubstance abuse in context of chronic pain -Continues to lack insight regarding his substance abuse  -Patient wishes to follow-up with methadone clinic after discharge -Continue weaning of narcotics as above   Covid  positive -Asymptomatic -Isolation completed 9/11  Odynophagia -Resolved   Data Reviewed: Basic Metabolic Panel: Recent Labs  Lab 07/08/21 0101  NA 138  K 4.3  CL 103  CO2 27  GLUCOSE 120*  BUN 29*  CREATININE 0.72  CALCIUM 9.4   Liver Function Tests: No results for input(s): AST, ALT, ALKPHOS, BILITOT, PROT, ALBUMIN in the last 168 hours.   CBC: Recent Labs  Lab 07/08/21 0101  WBC 4.9  NEUTROABS 2.4  HGB 11.7*  HCT 34.7*  MCV 80.7  PLT 144*    Scheduled Meds:  baclofen  10 mg Oral TID   buPROPion  100 mg Oral BID   diclofenac Sodium  4 g Topical QID   docusate sodium  100 mg Oral BID   enoxaparin (LOVENOX) injection  40 mg Subcutaneous Q24H   escitalopram  15 mg Oral Daily   ibuprofen  400 mg Oral QID   multivitamin with minerals  1 tablet Oral Daily   oxyCODONE  10 mg Oral Q4H   pregabalin  125 mg Oral TID   Ensure Max Protein  11 oz Oral BID   sodium chloride flush  3 mL Intravenous Q12H   Continuous Infusions:  ceFEPime (MAXIPIME) IV 200 mL/hr at 07/13/21 6415    Principal Problem:   Epidural abscess Active Problems:   Heroin abuse ([place])   Homeless   COVID-19 virus infection   Consultants: Neurosurgery Infectious disease  Procedures: 2D echocardiogram Cervical discectomy with debridement of epidural abscess and placement of intervertebral lordotic cage  Antibiotics: Meropenem x1 dose 9/1 Vancomycin IV 9/1 through 9/4 Ceftriaxone 9/2 through 9/4 Cefepime 9/5 >>   Time spent: 25 minutes    Erin Hearing ANP  [facility] 7 am - 330 pm/M-F for direct patient care and secure chat Please refer to Amion for contact info 26  days

## 2021-07-13 NOTE — Progress Notes (Signed)
Nutrition Follow-up  DOCUMENTATION CODES:  Not applicable  INTERVENTION:  -Continue Ensure Max po BID, each supplement provides 150 kcal and 30 grams of protein -Continue Magic cup TID with meals, each supplement provides 290 kcal and 9 grams of protein -Continue MVI with minerals daily  NUTRITION DIAGNOSIS:  Increased nutrient needs related to acute illness (epidural abscess) as evidenced by estimated needs. -- ongoing  GOAL:  Patient will meet greater than or equal to 90% of their needs -- progressing  MONITOR:  Skin, PO intake, Supplement acceptance  REASON FOR ASSESSMENT:  Consult Assessment of nutrition requirement/status  ASSESSMENT:  30 yo male with a PMH of IVDA and prior heroin overdose in 02/2021 presenting with upper back pain.  He reports onset of pain last night.  +subjective fevers.  He occasionally smokes tobacco and marijuana and occasionally drinks alcohol.  He uses heroin routinely, last use 2 days ago.  He moved to [place] at age [age] and dropped out of school shortly in 2023-02-01 grade.  His father died when he was 26.  His mother died of esophageal cancer in January 31, 2017.  He and his brother are both homeless and heroin abusers.  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 Admitted with epidural abscess.  9/02 - s/p C5-C6 ACDF   Pt with excellent appetite/intake. 100% meal completions x last 8 recorded meals. Pt doing well with Ensure Max BID. Continue current nutrition plan of care.   Medications: colace, mvi with minerals, IV abx Labs reviewed.  No UOP documented x24 hours  Diet Order:   Diet Order             Diet regular Room service appropriate? Yes; Fluid consistency: Thin  Diet effective now                  EDUCATION NEEDS:  No education needs have been identified at this time  Skin:  Skin Assessment: Skin Integrity Issues: Skin Integrity Issues:: Incisions Incisions: neck  Last BM:  9/24  Height:  Ht Readings from Last 1 Encounters:   06/19/21 5\' 9"  (1.753 m)   Weight:  Wt Readings from Last 4 Encounters:  07/12/21 69.1 kg  03/13/21 65 kg  02/23/21 64.1 kg  05/07/18 66.9 kg   BMI:  Body mass index is 22.5 kg/m.  Estimated Nutritional Needs:  Kcal:  2100-2300 Protein:  100-115 grams Fluid:  >2.1 L    05/09/18, MS, RD, LDN (she/her/hers) RD pager number and weekend/on-call pager number located in Amion.

## 2021-07-14 MED ORDER — HYDROXYZINE HCL 50 MG/ML IM SOLN
50.0000 mg | Freq: Four times a day (QID) | INTRAMUSCULAR | Status: DC | PRN
  Filled 2021-07-14: qty 1

## 2021-07-14 MED ORDER — OXYCODONE HCL 5 MG PO TABS
15.0000 mg | ORAL_TABLET | ORAL | Status: DC
  Administered 2021-07-14 – 2021-07-15 (×6): 15 mg via ORAL
  Filled 2021-07-14 (×6): qty 3

## 2021-07-14 MED ORDER — MAGNESIUM HYDROXIDE 400 MG/5ML PO SUSP
30.0000 mL | Freq: Once | ORAL | Status: DC
Start: 2021-07-14 — End: 2021-07-17
  Filled 2021-07-14: qty 30

## 2021-07-14 MED ORDER — POLYETHYLENE GLYCOL 3350 17 G PO PACK
17.0000 g | PACK | Freq: Every day | ORAL | Status: DC
  Filled 2021-07-14: qty 1

## 2021-07-14 MED ORDER — LORAZEPAM 0.5 MG PO TABS
0.5000 mg | ORAL_TABLET | Freq: Three times a day (TID) | ORAL | Status: DC
  Administered 2021-07-14 – 2021-07-15 (×3): 0.5 mg via ORAL
  Filled 2021-07-14 (×3): qty 1

## 2021-07-14 NOTE — Progress Notes (Signed)
[facility] PROGRESS NOTE  Gerald Oliver XNA:[id number] DOB: [birth year] DOA: 06/17/2021 PCP: Patient, No Pcp Per (Inactive)  Status: Remains inpatient appropriate because:Unsafe d/c plan, IV treatments appropriate due to intensity of illness or inability to take PO, and Inpatient level of care appropriate due to severity of illness  Dispo: The patient is from:  Homeless              Anticipated d/c is to:  Homeless-back to streets per his request              Patient currently is not medically stable to d/c.   Difficult to place patient No    Level of care: Telemetry Medical  Code Status: Full Family Communication: Patient only DVT prophylaxis: SCDs COVID vaccination status: Found to have incidental COVID this admission-asymptomatic-apparently was not immunized prior to admission   HPI: 30 year old male with history of IV drug use, prior heroin overdose in May 2022 comes into the hospital with upper back pain.  This is been going on for the last several days progressively getting worse.  He also reports subjective fevers.  He also smokes tobacco, marijuana and occasionally drinks EtOH.  He uses heroin regularly, last used 2 days prior to admission.  Of note, his brother is also hospitalized with IV drug use related complications.  On admission, he was noted to be incidentally COVID-positive.  MRI on admission showed an epidural abscess, neurosurgery consulted  Subjective: Laying in bed.  Reports going through withdrawal symptoms stating he has no appetite, he is nauseous, has abdominal cramping and generalized myalgias as well as episodes of sweating  Objective: Vitals:   07/14/21 0000 07/14/21 0500  BP: 101/65 110/65  Pulse: 64 66  Resp: 16 18  Temp: 97.6 F (36.4 C) 98.6 F (37 C)  SpO2: 99% 100%    Intake/Output Summary (Last 24 hours) at 07/14/2021 0736 Last data filed at 07/13/2021 2200 Gross per 24 hour  Intake 275 ml  Output --  Net 275 ml     Filed  Weights   07/09/21 0008 07/11/21 0500 07/12/21 0500  Weight: 68.3 kg 70.2 kg 69.1 kg    Exam: Constitutional: NAD, flat affect Respiratory: Anterior lung sounds remain normal without wheezing or rhonchi, able on room air and no increased work of breathing Cardiovascular: Heart sounds S1-S2, normotensive without evidence of hypertension, regular pulse tachycardia, skin warm and dry with appropriate capillary refill Abdomen: Soft nontender nondistended with normal active bowel sounds.  LBM 9/24-eating well Neurologic: CN 2-12 grossly intact. Sensation intact, Strength 5/5 x all 4 extremities.  Psychiatric: Awake and oriented x 3.  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  Discontinue baclofen in favor of Flexeril. -Recent MRI of C-spine with a fluid collection c/w seroma at C2 and C7 -no additional recommendations from neurosurgery -Discussed with Dr. Juleen China with ID and agrees this does not fit infectious process with labs supporting this assessment   Superficial abscess versus seroma C5/C6 -Soft tissue imaging revealed a fluid collection.  Both ID and neurosurgery reviewed and felt this was not  consistent with an infectious process  Insomnia/depression/chronic anxiety -Continue scheduled Ambien HS and Lexapro-some improvement in sleep pattern -Lexapro dose increased 9/21 -9/26 added Wellbutrin 100 mg BID -Avoid as needed benzodiazepines to treat patient repeated request  for anxiety medication -9/28 we will add as needed Vistaril IM for anxiety  Heroin, polysubstance abuse in context of chronic pain -Continues to lack insight regarding his substance abuse  -Patient wishes to follow-up with methadone clinic after discharge -Continue weaning of narcotics as above -Patient reporting withdrawal symptoms but no physiologic signs.  States unable to eat and has nausea which I suspect is primarily related to constipation. -We will go ahead and increase Oxy IR to 15 mg and add scheduled Ativan for the next 2 to 3 days then begin a taper -IM Vistaril as above  Constipation-narcotic induced -Last bowel movement 9/24 -One-time dose of magnesia -Change MiraLAX to scheduled daily   Covid positive -Asymptomatic -Isolation completed 9/11  Odynophagia -Resolved   Data Reviewed: Basic Metabolic Panel: Recent Labs  Lab 07/08/21 0101  NA 138  K 4.3  CL 103  CO2 27  GLUCOSE 120*  BUN 29*  CREATININE 0.72  CALCIUM 9.4   Liver Function Tests: No results for input(s): AST, ALT, ALKPHOS, BILITOT, PROT, ALBUMIN in the last 168 hours.   CBC: Recent Labs  Lab 07/08/21 0101  WBC 4.9  NEUTROABS 2.4  HGB 11.7*  HCT 34.7*  MCV 80.7  PLT 144*    Scheduled Meds:  buPROPion  100 mg Oral BID   cyclobenzaprine  7.5 mg Oral TID   diclofenac Sodium  4 g Topical QID   docusate sodium  100 mg Oral BID   enoxaparin (LOVENOX) injection  40 mg Subcutaneous Q24H   escitalopram  15 mg Oral Daily   ibuprofen  400 mg Oral QID   multivitamin with minerals  1 tablet Oral Daily   oxyCODONE  10 mg Oral Q4H   pregabalin  125 mg Oral TID   Ensure Max Protein  11 oz Oral BID   sodium chloride flush  3 mL Intravenous Q12H   Continuous Infusions:  ceFEPime (MAXIPIME) IV 2 g (07/14/21 0459)    Principal Problem:   Epidural abscess Active Problems:   Heroin abuse ([place])   Homeless   COVID-19 virus infection   Consultants: Neurosurgery Infectious  disease  Procedures: 2D echocardiogram Cervical discectomy with debridement of epidural abscess and placement of intervertebral lordotic cage  Antibiotics: Meropenem x1 dose 9/1 Vancomycin IV 9/1 through 9/4 Ceftriaxone 9/2 through 9/4 Cefepime 9/5 >>   Time spent: 25 minutes    Erin Hearing ANP  [facility] 7 am - 330 pm/M-F for direct patient care and secure chat Please refer to Amion for contact info 27  days

## 2021-07-15 LAB — CBC WITH DIFFERENTIAL/PLATELET
Abs Immature Granulocytes: 0.04 10*3/uL (ref 0.00–0.07)
Basophils Absolute: 0 10*3/uL (ref 0.0–0.1)
Basophils Relative: 1 %
Eosinophils Absolute: 0.2 10*3/uL (ref 0.0–0.5)
Eosinophils Relative: 4 %
HCT: 38 % — ABNORMAL LOW (ref 39.0–52.0)
Hemoglobin: 12.4 g/dL — ABNORMAL LOW (ref 13.0–17.0)
Immature Granulocytes: 1 %
Lymphocytes Relative: 50 %
Lymphs Abs: 2.7 10*3/uL (ref 0.7–4.0)
MCH: 26.4 pg (ref 26.0–34.0)
MCHC: 32.6 g/dL (ref 30.0–36.0)
MCV: 81 fL (ref 80.0–100.0)
Monocytes Absolute: 0.5 10*3/uL (ref 0.1–1.0)
Monocytes Relative: 10 %
Neutro Abs: 1.8 10*3/uL (ref 1.7–7.7)
Neutrophils Relative %: 34 %
Platelets: 153 10*3/uL (ref 150–400)
RBC: 4.69 MIL/uL (ref 4.22–5.81)
RDW: 14.8 % (ref 11.5–15.5)
WBC: 5.3 10*3/uL (ref 4.0–10.5)
nRBC: 0 % (ref 0.0–0.2)

## 2021-07-15 LAB — BASIC METABOLIC PANEL
Anion gap: 10 (ref 5–15)
BUN: 26 mg/dL — ABNORMAL HIGH (ref 6–20)
CO2: 26 mmol/L (ref 22–32)
Calcium: 9.4 mg/dL (ref 8.9–10.3)
Chloride: 100 mmol/L (ref 98–111)
Creatinine, Ser: 0.81 mg/dL (ref 0.61–1.24)
GFR, Estimated: >60 mL/min (ref >60)
Glucose, Bld: 79 mg/dL (ref 70–99)
Potassium: 3.5 mmol/L (ref 3.5–5.1)
Sodium: 136 mmol/L (ref 135–145)

## 2021-07-15 MED ORDER — CYCLOBENZAPRINE HCL 10 MG PO TABS
10.0000 mg | ORAL_TABLET | Freq: Three times a day (TID) | ORAL | Status: DC
  Administered 2021-07-15 – 2021-07-19 (×14): 10 mg via ORAL
  Filled 2021-07-15 (×13): qty 1

## 2021-07-15 MED ORDER — HYDROXYZINE HCL 25 MG PO TABS
50.0000 mg | ORAL_TABLET | Freq: Four times a day (QID) | ORAL | Status: DC | PRN
  Administered 2021-07-18: 50 mg via ORAL
  Filled 2021-07-15: qty 2

## 2021-07-15 MED ORDER — METHADONE HCL 10 MG PO TABS
20.0000 mg | ORAL_TABLET | Freq: Every day | ORAL | Status: DC
Start: 2021-07-15 — End: 2021-07-19
  Administered 2021-07-15 – 2021-07-19 (×5): 20 mg via ORAL
  Filled 2021-07-15 (×5): qty 2

## 2021-07-15 NOTE — Progress Notes (Signed)
    [facility]    Date of Admission:  06/17/2021   Total days of antibiotics 26 cefepime   ID: Gerald Oliver is a 30 y.o. 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 Neck pain improving.  Medications:   cyclobenzaprine  10 mg Oral TID   diclofenac Sodium  4 g Topical QID   docusate sodium  100 mg Oral BID   enoxaparin (LOVENOX) injection  40 mg Subcutaneous Q24H   ibuprofen  400 mg Oral QID   magnesium hydroxide  30 mL Oral Once   methadone  20 mg Oral Daily   multivitamin with minerals  1 tablet Oral Daily   polyethylene glycol  17 g Oral Daily   pregabalin  125 mg Oral TID   Ensure Max Protein  11 oz Oral BID   sodium chloride flush  3 mL Intravenous Q12H    Objective: Vital signs in last 24 hours: Temp:  [97.5 F (36.4 C)-98.2 F (36.8 C)] 97.5 F (36.4 C) (09/29 1549) Pulse Rate:  [75-96] 92 (09/29 1549) Resp:  [16-20] 16 (09/29 1549) BP: (102-132)/(57-86) 115/71 (09/29 1549) SpO2:  [96 %-100 %] 97 % (09/29 1549) Weight:  [69.2 kg] 69.2 kg (09/29 0500) Physical Exam  Constitutional: He is oriented to person, place, and time. He appears well-developed and well-nourished. No distress.  HENT:  Mouth/Throat: Oropharynx is clear and moist. No oropharyngeal exudate. Neck incision is c/d/I, well healed Cardiovascular: Normal rate, regular rhythm and normal heart sounds. Exam reveals no gallop and no friction rub.  No murmur heard.  Pulmonary/Chest: Effort normal and breath sounds normal. No respiratory distress. He has no wheezes.  Abdominal: Soft. Bowel sounds are normal. He exhibits no distension. There is no tenderness.  Neurological: He is alert and oriented to person, place, and time.  Skin: Skin is warm and  dry. No rash noted. No erythema.  Psychiatric: He has a normal mood and affect. His behavior is normal.    Lab Results Recent Labs    07/15/21 0048  WBC 5.3  HGB 12.4*  HCT 38.0*  NA 136  K 3.5  CL 100  CO2 26  BUN 26*  CREATININE 0.81   Microbiology: 06/18/21 OR culture -serratia Studies/Results: No results found.   Assessment/Plan: Serratia cervical epidural abscess = continue on cefepime through Monday, (which will be at least 4 wk of IV cefepime), will finish out course with oral levofloxacin.  Long QT= will check QTC today and Monday to see is < 500.  Heroin use =patient to transition to methadone   [facility] Pager: [phone]  07/15/2021, 3:50 PM

## 2021-07-15 NOTE — Progress Notes (Addendum)
[facility] PROGRESS NOTE  Gerald Oliver PRF:[id number] DOB: 02/15/1991 DOA: 06/17/2021 PCP: Patient, No Pcp Per (Inactive)  Status: Remains inpatient appropriate because:Unsafe d/c plan, IV treatments appropriate due to intensity of illness or inability to take PO, and Inpatient level of care appropriate due to severity of illness  Dispo: The patient is from:  Homeless              Anticipated d/c is to:  Homeless-back to streets per his request              Patient currently is not medically stable to d/c.   Difficult to place patient No    Level of care: Telemetry Medical  Code Status: Full Family Communication: Patient only DVT prophylaxis: SCDs COVID vaccination status: Found to have incidental COVID this admission-asymptomatic-apparently was not immunized prior to admission   HPI: 30 year old male with history of IV drug use, prior heroin overdose in May 2022 comes into the hospital with upper back pain.  This is been going on for the last several days progressively getting worse.  He also reports subjective fevers.  He also smokes tobacco, marijuana and occasionally drinks EtOH.  He uses heroin regularly, last used 2 days prior to admission.  Of note, his brother is also hospitalized with IV drug use related complications.  On admission, he was noted to be incidentally COVID-positive.  MRI on admission showed an epidural abscess, neurosurgery consulted  Subjective: Patient very anxious this morning.  States he wants me to take him off all the mood stabilizers.  States the Ativan makes him feel goofy.  Made him aware that after a discussion with current attending physician that we will methadone but reinforced that we would not be able to prescribe the methadone at discharge.  I also reinforced that because he is on methadone we will discontinue the Oxy IR and the IV Dilaudid.  He is requesting that his muscle relaxer be increased and this is not unreasonable given multiple  med changes recently.  He is also requesting Ambien and Lyrica at discharge which I had planned on giving him short-term.  Previously I discussed with him the possibility of short-term Oxy IR.  He continues to tell me that right now he is going through withdrawals despite having no clinical evidence of this such as diaphoresis, hypertension, tachycardia or nausea and vomiting.  He states that if he is not given any narcotics at discharge he is afraid he will relapse and shoot up again.  Initially he told me it would take 2 weeks to get a methadone clinic appointment then as I was leaving the room he told me it would take 1 week.  Objective: Vitals:   07/15/21 0044 07/15/21 0734  BP: 132/86 (!) 102/57  Pulse: 75 78  Resp: 17 20  Temp: 97.6 F (36.4 C) 97.6 F (36.4 C)  SpO2: 100% 100%    Intake/Output Summary (Last 24 hours) at 07/15/2021 0749 Last data filed at 07/14/2021 2145 Gross per 24 hour  Intake 240 ml  Output --  Net 240 ml     Filed Weights   07/11/21 0500 07/12/21 0500 07/15/21 0500  Weight: 70.2 kg 69.1 kg 69.2 kg    Exam: Constitutional: NAD, anxious affect Respiratory: Lung sounds clear.  Stable on room air.  Normal pulse oximetry Cardiovascular: S1-S2, normotensive, regular pulse without tachycardia, skin warm and dry with appropriate capillary refill Abdomen: Soft nontender nondistended with normal active bowel sounds.  LBM 9/28-eating well Neurologic:  CN 2-12 grossly intact. Sensation intact, Strength 5/5 x all 4 extremities.  Psychiatric: Awake and oriented x 3.  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  Discontinue baclofen in favor of Flexeril. -Recent MRI of C-spine with a fluid collection c/w seroma at C2 and C7 -no additional recommendations from neurosurgery -Discussed with Dr. Juleen China with ID and agrees this does not fit infectious process with labs supporting this assessment -ID considering transitioning over to oral antibiotic/floroquinolone-this would allow for earlier discharge-patient made aware of this information-of note patient will be resuming methadone treatment after discharge and it is doubtful he will be able to obtain serial EKGs to ensure no QTC prolongation   Superficial abscess versus seroma C5/C6 -Soft tissue imaging revealed a fluid collection.  Both ID and neurosurgery reviewed and felt this was not consistent with an infectious process  Insomnia/depression/chronic anxiety -Continue scheduled Ambien HS and Lexapro-some improvement in sleep pattern -Per patient request have discontinued Lexapro and Wellbutrin -Continue Vistaril for anxiety  Heroin, polysubstance abuse in context of chronic pain -Continues to lack insight regarding his substance abuse -states that he will start using heroin again after discharge if not given a short-term prescription for Oxy IR -Patient wishes to follow-up with methadone clinic after discharge -Per recommendation of my attending physician we will begin methadone daily-today's EKG/QTC 430 ms-follow EKG daily -Continues to report worsening neck pain so Flexeril dose increased to maximum  Constipation-narcotic induced -One-time dose of MOM 9/28 with results noted -Continue MiraLAX daily   Covid positive -Asymptomatic -Isolation completed 9/11  Odynophagia -Resolved   Data Reviewed: Basic Metabolic Panel: Recent Labs  Lab 07/15/21 0048  NA 136  K 3.5  CL 100   CO2 26  GLUCOSE 79  BUN 26*  CREATININE 0.81  CALCIUM 9.4   Liver Function Tests: No results for input(s): AST, ALT, ALKPHOS, BILITOT, PROT, ALBUMIN in the last 168 hours.   CBC: Recent Labs  Lab 07/15/21 0048  WBC 5.3  NEUTROABS 1.8  HGB 12.4*  HCT 38.0*  MCV 81.0  PLT 153    Scheduled Meds:  buPROPion  100 mg Oral BID   cyclobenzaprine  7.5 mg Oral TID   diclofenac Sodium  4 g Topical QID   docusate sodium  100 mg Oral BID   enoxaparin (LOVENOX) injection  40 mg Subcutaneous Q24H   escitalopram  15 mg Oral Daily   ibuprofen  400 mg Oral QID   LORazepam  0.5 mg Oral Q8H   magnesium hydroxide  30 mL Oral Once   multivitamin with minerals  1 tablet Oral Daily   oxyCODONE  15 mg Oral Q4H   polyethylene glycol  17 g Oral Daily   pregabalin  125 mg Oral TID   Ensure Max Protein  11 oz Oral BID   sodium chloride flush  3 mL Intravenous Q12H   Continuous Infusions:  ceFEPime (MAXIPIME) IV 2 g (07/15/21 0655)    Principal Problem:   Epidural abscess Active Problems:   Heroin abuse ([place])   Homeless   COVID-19 virus infection  Consultants: Neurosurgery Infectious disease  Procedures: 2D echocardiogram Cervical discectomy with debridement of epidural abscess and placement of intervertebral lordotic cage  Antibiotics: Meropenem x1 dose 9/1 Vancomycin IV 9/1 through 9/4 Ceftriaxone 9/2 through 9/4 Cefepime 9/5 >>   Time spent: 25 minutes    Erin Hearing ANP  [facility] 7 am - 330 pm/M-F for direct patient care and secure chat Please refer to Amion for contact info 28  days

## 2021-07-16 NOTE — Progress Notes (Signed)
    [facility]    Date of Admission:  06/17/2021   Total days of antibiotics 27   ID: Gerald Oliver is a 30 y.o. male with  serratia epidural abscess/vertebral osteomyelitis Principal Problem:   Epidural abscess Active Problems:   Heroin abuse ([facility])   Homeless   COVID-19 virus infection    Subjective: Afebrile. Improved back pain.has questions on plan to bridge him to methadone clinic  ROS: 12 point ros is negative  Medications:   cyclobenzaprine  10 mg Oral TID   diclofenac Sodium  4 g Topical QID   docusate sodium  100 mg Oral BID   enoxaparin (LOVENOX) injection  40 mg Subcutaneous Q24H   ibuprofen  400 mg Oral QID   magnesium hydroxide  30 mL Oral Once   methadone  20 mg Oral Daily   multivitamin with minerals  1 tablet Oral Daily   polyethylene glycol  17 g Oral Daily   pregabalin  125 mg Oral TID   Ensure Max Protein  11 oz Oral BID   sodium chloride flush  3 mL Intravenous Q12H    Objective: Vital signs in last 24 hours: Temp:  [97.4 F (36.3 C)-98.2 F (36.8 C)] 97.7 F (36.5 C) (09/30 1200) Pulse Rate:  [68-99] 99 (09/30 1200) Resp:  [12-19] 16 (09/30 1200) BP: (100-118)/(58-75) 118/75 (09/30 1200) SpO2:  [97 %-100 %] 98 % (09/30 1200) Weight:  [70 kg] 70 kg (09/30 0500)  Physical Exam  Constitutional: He is oriented to person, place, and time. He appears well-developed and well-nourished. No distress.  HENT:  Mouth/Throat: Oropharynx is clear and moist. No oropharyngeal exudate.  Cardiovascular: Normal rate, regular rhythm and normal heart sounds. Exam reveals no gallop and no friction rub.  No murmur heard.  Pulmonary/Chest: Effort normal and breath sounds normal. No respiratory distress. He has no wheezes.  Abdominal: Soft. Bowel sounds are normal. He exhibits no distension. There is no tenderness.  Lymphadenopathy:  He has no cervical adenopathy.  Neurological: He is alert and oriented to person, place, and time.   Skin: Skin is warm and dry. No rash noted. No erythema.  Psychiatric: He has a normal mood and affect. His behavior is normal.    Lab Results Recent Labs    07/15/21 0048  WBC 5.3  HGB 12.4*  HCT 38.0*  NA 136  K 3.5  CL 100  CO2 26  BUN 26*  CREATININE 0.81   Lab Results  Component Value Date   ESRSEDRATE 15 07/08/2021     Microbiology: +serratia Studies/Results: No results found.   Assessment/Plan: Serratia epidural abscess with anterior cervical decompression and osteomyelitis = finishing 4 wk of cefepime. Plan to transition to levofloxacin 750mg  daily on Monday to finish out 2 additional weeks.  Opiate dependence = temporary plan is to give suboxone for X days until his methadone appt. He is to call clinic on Monday to have appt date.  [facility] Pager: [phone]  07/16/2021, 2:37 PM

## 2021-07-16 NOTE — Progress Notes (Signed)
[facility] PROGRESS NOTE  Gerald Oliver KXF:[id number] DOB: 1990-12-24 DOA: 06/17/2021 PCP: Patient, No Pcp Per (Inactive)  Status: Remains inpatient appropriate because:Unsafe d/c plan, IV treatments appropriate due to intensity of illness or inability to take PO, and Inpatient level of care appropriate due to severity of illness  Dispo: The patient is from:  Homeless              Anticipated d/c is to:  Homeless-back to streets per his request              Patient currently is not medically stable to d/c.   Difficult to place patient No    Level of care: Telemetry Medical  Code Status: Full Family Communication: Patient only DVT prophylaxis: SCDs COVID vaccination status: Found to have incidental COVID this admission-asymptomatic-apparently was not immunized prior to admission   HPI: 30 year old male with history of IV drug use, prior heroin overdose in May 2022 comes into the hospital with upper back pain.  This is been going on for the last several days progressively getting worse.  He also reports subjective fevers.  He also smokes tobacco, marijuana and occasionally drinks EtOH.  He uses heroin regularly, last used 2 days prior to admission.  Of note, his brother is also hospitalized with IV drug use related complications.  On admission, he was noted to be incidentally COVID-positive.  MRI on admission showed an epidural abscess, neurosurgery consulted  Subjective: Continues to complain of myalgias which he perceives is related to withdrawal.  He is not having any other withdrawal symptoms and I explained to him that he has been spending a lot of time in the bed and that he should get up and mobilize more.  He states that he walks in his room.  Told him he needs to walk longer distances such as in the home.  He states he does not want to see any other people.  During my exam he asked for at least 1 dose of Oxy to help with his withdrawal and I told him know since he is on the  methadone.  He did not ask for any more narcotics or other sedating medications.  Formed that his last dose of IV antibiotics will be on 10/3 and probably on 10/4 he will be ready for discharge  Objective: Vitals:   07/15/21 2336 07/16/21 0419  BP: (!) 100/58 112/73  Pulse: 68 74  Resp: 18 19  Temp: 97.9 F (36.6 C) (!) 97.4 F (36.3 C)  SpO2: 99% 99%    Intake/Output Summary (Last 24 hours) at 07/16/2021 0807 Last data filed at 07/15/2021 2208 Gross per 24 hour  Intake 483 ml  Output --  Net 483 ml     Filed Weights   07/12/21 0500 07/15/21 0500 07/16/21 0500  Weight: 69.1 kg 69.2 kg 70 kg    Exam: Constitutional: NAD, laying in bed and in no acute distress Respiratory: Lung sounds clear.  Stable on room air.  Normal pulse oximetry Cardiovascular: S1-S2, normotensive, regular pulse without tachycardia, skin warm and dry  Abdomen: Soft nontender nondistended with normal active bowel sounds.  LBM 9/28-eating well Neurologic: CN 2-12 grossly intact. Sensation intact, Strength 5/5 x all 4 extremities.  Psychiatric: Awake and oriented x 3.  Anxious with pressured speech   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 (operative culture) -MRI on admission c/w soft tissue infection with epidural/paraspinal phlegmon at the C4-C6 level  - 06/18/2021, status post anterior cervical decompression/discectomy  with fusion C5-C6 by NS -Continue scheduled Oxy IR, scheduled ibuprofen, Lyrica.  Discontinue baclofen in favor of Flexeril. -Recent MRI of C-spine with a fluid collection c/w seroma at C2 and C7 -no additional recommendations from neurosurgery -Discussed with Dr. Earlene Plater with ID and agrees this does not fit infectious process with labs supporting this assessment -ID recommends continuing Maxipime through Monday, October 3 which will be 4 weeks total of this medication.  He will transition to oral Levaquin which will be continued until  10/14. -Currently on methadone and patient plans on following up with methadone clinic after discharge.  Will need to follow QTC while taking both Levaquin and methadone.  On 9/29 QTC 430 ms on 9/30 QTC is 403 ms   Superficial abscess versus seroma C5/C6 -Soft tissue imaging revealed a fluid collection.  Both ID and neurosurgery reviewed and felt this was not consistent with an infectious process  Insomnia/depression/chronic anxiety -Continue scheduled Ambien HS and Lexapro-some improvement in sleep pattern -Per patient request have discontinued Lexapro and Wellbutrin -Continue Vistaril for anxiety  Heroin, polysubstance abuse in context of chronic pain -Continues to lack insight regarding his substance abuse -states that he will start using heroin again after discharge if not given a short-term prescription for Oxy IR -Patient wishes to follow-up with methadone clinic after discharge -Per recommendation of my attending physician we will begin methadone daily-today's EKG/QTC 430 ms-follow EKG daily -Continues to request narcotics even though has been instructed that while on methadone cannot receive any narcotics or benzodiazepines  Constipation-narcotic induced -One-time dose of MOM 9/28 with results noted -Continue MiraLAX daily   Covid positive -Asymptomatic -Isolation completed 9/11  Odynophagia -Resolved   Data Reviewed: Basic Metabolic Panel: Recent Labs  Lab 07/15/21 0048  NA 136  K 3.5  CL 100  CO2 26  GLUCOSE 79  BUN 26*  CREATININE 0.81  CALCIUM 9.4   Liver Function Tests: No results for input(s): AST, ALT, ALKPHOS, BILITOT, PROT, ALBUMIN in the last 168 hours.   CBC: Recent Labs  Lab 07/15/21 0048  WBC 5.3  NEUTROABS 1.8  HGB 12.4*  HCT 38.0*  MCV 81.0  PLT 153    Scheduled Meds:  cyclobenzaprine  10 mg Oral TID   diclofenac Sodium  4 g Topical QID   docusate sodium  100 mg Oral BID   enoxaparin (LOVENOX) injection  40 mg Subcutaneous Q24H    ibuprofen  400 mg Oral QID   magnesium hydroxide  30 mL Oral Once   methadone  20 mg Oral Daily   multivitamin with minerals  1 tablet Oral Daily   polyethylene glycol  17 g Oral Daily   pregabalin  125 mg Oral TID   Ensure Max Protein  11 oz Oral BID   sodium chloride flush  3 mL Intravenous Q12H   Continuous Infusions:  ceFEPime (MAXIPIME) IV 2 g (07/16/21 0603)    Principal Problem:   Epidural abscess Active Problems:   Heroin abuse (HCC)   Homeless   COVID-19 virus infection   Consultants: Neurosurgery Infectious disease  Procedures: 2D echocardiogram Cervical discectomy with debridement of epidural abscess and placement of intervertebral lordotic cage  Antibiotics: Meropenem x1 dose 9/1 Vancomycin IV 9/1 through 9/4 Ceftriaxone 9/2 through 9/4 Cefepime 9/5 >>   Time spent: 25 minutes    Junious Silk ANP  [facility] 7 am - 330 pm/M-F for direct patient care and secure chat Please refer to Amion for contact info 29  days

## 2021-07-16 NOTE — Plan of Care (Signed)

## 2021-07-17 MED ORDER — POLYETHYLENE GLYCOL 3350 17 G PO PACK
17.0000 g | PACK | Freq: Two times a day (BID) | ORAL | Status: DC
  Filled 2021-07-17 (×3): qty 1

## 2021-07-17 MED ORDER — BISACODYL 10 MG RE SUPP: 10.0000 mg | Freq: Every day | RECTAL | Status: DC | PRN

## 2021-07-17 MED ORDER — SENNOSIDES-DOCUSATE SODIUM 8.6-50 MG PO TABS
2.0000 | ORAL_TABLET | Freq: Every day | ORAL | Status: DC
  Administered 2021-07-18: 2 via ORAL
  Filled 2021-07-17 (×2): qty 2

## 2021-07-17 MED ORDER — FLEET ENEMA 7-19 GM/118ML RE ENEM: 1.0000 | ENEMA | Freq: Every day | RECTAL | Status: DC | PRN

## 2021-07-17 MED ORDER — KETOROLAC TROMETHAMINE 30 MG/ML IJ SOLN
30.0000 mg | Freq: Three times a day (TID) | INTRAMUSCULAR | Status: DC | PRN
  Administered 2021-07-17 – 2021-07-19 (×6): 30 mg via INTRAVENOUS
  Filled 2021-07-17 (×6): qty 1

## 2021-07-17 NOTE — Progress Notes (Signed)
Patient refusing 8AM vitals and morning EKG, says he doesn't want to be disturbed, MD aware.

## 2021-07-17 NOTE — Plan of Care (Signed)

## 2021-07-17 NOTE — Progress Notes (Signed)
Subjective: Lying comfortably in bed-continues to complain of aches-thinks he is withdrawing.  Objective: Blood pressure 107/65, pulse 86, temperature 98.2 F (36.8 C), temperature source Oral, resp. rate 16, height 5\' 9"  (1.753 m), weight 70 kg, SpO2 99 %.   Assessment/plan: Cervical epidural abscess due to Serratia-s/p anterior cervical decompression: Continue IV cefepime-plan is to transition to levofloxacin on Monday.  Opiate dependence: Uses heroin-currently on methadone.  He continues to complain of aches/pains-but appears very comfortable on current dosing of methadone.  He has no clinical features suggestive of narcotic withdrawal.  He exhibits narcotic seeking behavior.  Total time spent: 15 minutes

## 2021-07-18 NOTE — Progress Notes (Addendum)
Patient refused 0000 vital signs. Stated "They just checked them." Nurse last checked vital signs at 2000.

## 2021-07-18 NOTE — Progress Notes (Signed)
Subjective: Sleeping-had headphones in his ears-appears very comfortable.  No complaints.  Objective: Blood pressure 106/62, pulse 70, temperature 97.9 F (36.6 C), temperature source Oral, resp. rate 18, height 5\' 9"  (1.753 m), weight 71.1 kg, SpO2 100 %.   Assessment/plan: Cervical epidural abscess due to Serratia-s/p anterior cervical decompression: Continue IV cefepime-plan is to transition to levofloxacin on Monday.  Opiate dependence: Uses heroin-currently on methadone.  He continues to complain of aches/pains-but appears very comfortable on current dosing of methadone.  He has no clinical features suggestive of narcotic withdrawal.  He exhibits narcotic seeking behavior.  Total time spent: 15 minutes

## 2021-07-18 NOTE — Progress Notes (Signed)
Patient refused AM EKG, notified by EKG tech, patient stated "it's too early"

## 2021-07-18 NOTE — Progress Notes (Addendum)
Patient refused 0400 vital signs. Patient appears agitated with this nurse this morning when nurse explained it was not time for pain medication yet but that she would bring it as soon as she was able to.

## 2021-07-18 NOTE — Progress Notes (Signed)
At 1600 assessment patient A+Ox4, but rambling and making little sense in conversation. Patient seemed agitated and stated " damn withdrawal man" and upon RN exiting room, followed RN to the door and asked to walk in the hallway. Patient made several laps around Nurses station, which is unusual for patient as they frequently request to not be disturbed, refuse patient care, and keep door closed. Patient vitals taken shortly after they were asked to return to room, RR and HR elevated, MEWS yellow, patient began arguing with his brother who was visiting. Another RN entered room to ask patient and his brother to stop arguing. Security called, MD notified of events.

## 2021-07-19 ENCOUNTER — Other Ambulatory Visit (HOSPITAL_COMMUNITY): Payer: Self-pay

## 2021-07-19 DIAGNOSIS — F411 Generalized anxiety disorder: Secondary | ICD-10-CM | POA: Diagnosis present

## 2021-07-19 DIAGNOSIS — F32A Depression, unspecified: Secondary | ICD-10-CM | POA: Diagnosis present

## 2021-07-19 DIAGNOSIS — F5104 Psychophysiologic insomnia: Secondary | ICD-10-CM | POA: Diagnosis present

## 2021-07-19 MED ORDER — LEVOFLOXACIN 750 MG PO TABS: 750.0000 mg | ORAL_TABLET | Freq: Every day | ORAL | Status: DC

## 2021-07-19 MED ORDER — ZOLPIDEM TARTRATE 5 MG PO TABS: 5.0000 mg | ORAL_TABLET | Freq: Every evening | ORAL | 0 refills | Status: DC | PRN

## 2021-07-19 MED ORDER — LEVOFLOXACIN 750 MG PO TABS
750.0000 mg | ORAL_TABLET | Freq: Every day | ORAL | Status: DC
  Administered 2021-07-19: 750 mg via ORAL
  Filled 2021-07-19: qty 1

## 2021-07-19 MED ORDER — LEVOFLOXACIN 750 MG PO TABS
750.0000 mg | ORAL_TABLET | Freq: Every day | ORAL | 0 refills | Status: DC
  Filled 2021-07-19: qty 11, 11d supply, fill #0

## 2021-07-19 MED ORDER — PREGABALIN 100 MG PO CAPS
100.0000 mg | ORAL_CAPSULE | Freq: Three times a day (TID) | ORAL | 0 refills | Status: AC
  Filled 2021-07-19: qty 30, 10d supply, fill #0

## 2021-07-19 MED ORDER — LEVOFLOXACIN 750 MG PO TABS
750.0000 mg | ORAL_TABLET | Freq: Every day | ORAL | 0 refills | Status: AC
  Filled 2021-07-19: qty 28, 28d supply, fill #0

## 2021-07-19 MED ORDER — ACETAMINOPHEN 325 MG PO TABS: 650.0000 mg | ORAL_TABLET | Freq: Four times a day (QID) | ORAL | Status: AC | PRN

## 2021-07-19 MED ORDER — HYDROXYZINE HCL 25 MG PO TABS
50.0000 mg | ORAL_TABLET | Freq: Four times a day (QID) | ORAL | 0 refills | Status: AC | PRN
  Filled 2021-07-19: qty 60, 8d supply, fill #0

## 2021-07-19 MED ORDER — PREGABALIN 100 MG PO CAPS: 100.0000 mg | ORAL_CAPSULE | Freq: Three times a day (TID) | ORAL | 0 refills | Status: DC

## 2021-07-19 MED ORDER — CYCLOBENZAPRINE HCL 10 MG PO TABS
10.0000 mg | ORAL_TABLET | Freq: Three times a day (TID) | ORAL | 0 refills | Status: AC
  Filled 2021-07-19: qty 30, 10d supply, fill #0

## 2021-07-19 MED ORDER — ZOLPIDEM TARTRATE 5 MG PO TABS
5.0000 mg | ORAL_TABLET | Freq: Every evening | ORAL | 0 refills | Status: AC | PRN
  Filled 2021-07-19: qty 30, 30d supply, fill #0

## 2021-07-19 MED ORDER — LEVOFLOXACIN 750 MG PO TABS
750.0000 mg | ORAL_TABLET | Freq: Every day | ORAL | 0 refills | Status: DC
  Filled 2021-07-19: qty 10, 10d supply, fill #0

## 2021-07-19 NOTE — Progress Notes (Signed)
CSW added homeless shelters and substance abuse resources to patient's AVS.   Edwin Dada, MSW, LCSW Transitions of Care  Clinical Social Worker II [phone]

## 2021-07-19 NOTE — Plan of Care (Signed)

## 2021-07-19 NOTE — Progress Notes (Signed)
    [facility]    Date of Admission:  06/17/2021   Total days of antibiotics 30/ day 1 levofloxacin           ID: Gerald Oliver is a 30 y.o. 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 Getting ready to be discharged. Awaiting appt from methadone clinic  Medications:   cyclobenzaprine  10 mg Oral TID   diclofenac Sodium  4 g Topical QID   enoxaparin (LOVENOX) injection  40 mg Subcutaneous Q24H   levofloxacin  750 mg Oral Daily   methadone  20 mg Oral Daily   multivitamin with minerals  1 tablet Oral Daily   polyethylene glycol  17 g Oral BID   pregabalin  125 mg Oral TID   Ensure Max Protein  11 oz Oral BID   senna-docusate  2 tablet Oral QHS   sodium chloride flush  3 mL Intravenous Q12H    Objective: Vital signs in last 24 hours: Temp:  [97.6 F (36.4 C)-98 F (36.7 C)] 98 F (36.7 C) (10/03 0755) Pulse Rate:  [79-102] 84 (10/03 0755) Resp:  [17-22] 18 (10/03 0755) BP: (95-132)/(68-88) 111/68 (10/03 0755) SpO2:  [97 %-100 %] 99 % (10/03 0755) Weight:  [75.3 kg] 75.3 kg (10/03 0547) Physical Exam  Constitutional: He is oriented to person, place, and time. He appears well-developed and well-nourished. No distress.  HENT: neck incision c/d/i Mouth/Throat: Oropharynx is clear and moist. No oropharyngeal exudate.  Cardiovascular: Normal rate, regular rhythm and normal heart sounds. Exam reveals no gallop and no friction rub.  No murmur heard.  Pulmonary/Chest: Effort normal and breath sounds normal. No respiratory distress. He has no wheezes.  Abdominal: Soft. Bowel sounds are normal. He exhibits no distension. There is no tenderness.  Lymphadenopathy:  He has no cervical  adenopathy.  Neurological: He is alert and oriented to person, place, and time.  Skin: Skin is warm and dry. No rash noted. No erythema.  Psychiatric: He has a normal mood and affect. His behavior is normal.    Lab Results Lab Results  Component Value Date   ESRSEDRATE 15 07/08/2021     Microbiology: reviewed Studies/Results: No results found.   Assessment/Plan: Serratia SEA and cervical osteomyelitis s/p decompression = will discharge on 28 days of levofloxacin which will provide 8 wk treatment course should he take all his medications. He has follow up on oct 18th with Dr Earlene Plater. Appt on AVS  Opiate dependence = trying to transition him to methadone clinic in community. Likely to get methadone dose here then follow up as outpatient.  Hep C ab positive = viral load undetectable. No need for treatment   [facility] Pager: [phone]  07/19/2021, 10:27 AM

## 2021-07-19 NOTE — Progress Notes (Signed)
Patient refused his 12 lead EKG this morning.  RN explained to pt that the purpose of having a EKG.  Patient said he completely understand what the test and tell RN to let the octor know that he refuse the EKG.  Dr. Jerral Ralph notified via secure chat.

## 2021-07-19 NOTE — Discharge Summary (Addendum)
Physician Discharge Summary  Gerald Oliver PIR:[id number] DOB: Jul 07, 1991 DOA: 06/17/2021  PCP: Patient, No Pcp Per (Inactive)  Admit date: 06/17/2021 Discharge date: 07/19/2021  Time spent: 35 minutes  Recommendations for Outpatient Follow-up:  Patient has an appointment with [facility] to establish with a primary care provider.  This appointment is on Monday, October 24 at 1:30 PM Patient needs to follow-up with neurosurgery after discharge.  An ambulatory referral will be sent to the office.  Because the patient is homeless and does not have a cell phone I will ask that the neurosurgery office contact the patient's PCP office regarding follow-up appointment date and time. Patient has significant issues with substance abuse primarily heroin.  He has been successfully treated with methadone before and plans on establishing with [facility] as soon as possible after discharge.  Contact person: Council Mechanic [phone] ext [phone] have called and left two detailed voice messages regarding request for follow-up and making the clinic aware patient has had recent laboratory work here at the hospital to avoid duplication of services.  He was also made aware of initiation of decline until 10/14 and risks of prolonged QT syndrome when given concomitantly with methadone with our recommendation to follow EKG regularly. Initial medications will be obtained from [facility] pharmacy and patient will receive MATCH funds Patient has been started on 28 days of Levaquin to complete his antibiotic therapy.  Once he is started on outpatient methadone it is recommended that be followed regularly to ensure no prolonged QT syndrome   Discharge Diagnoses:  Principal Problem:   Epidural abscess Active Problems:   Heroin abuse (HCC)   Homeless   COVID-19 virus infection   Chronic depression   Generalized anxiety disorder   Chronic insomnia    Discharge Condition: Stable  Diet  recommendation: Regular  Filed Weights   07/16/21 0500 07/18/21 0530 07/19/21 0547  Weight: 70 kg 71.1 kg 75.3 kg    History of present illness:  30 year old male with history of IV drug use, prior heroin overdose in May 2022 comes into the hospital with upper back pain.  This is been going on for the last several days progressively getting worse.  He also reports subjective fevers.  He also smokes tobacco, marijuana and occasionally drinks EtOH.  He uses heroin regularly, last used 2 days prior to admission.  Of note, his brother is also hospitalized with IV drug use related complications.  On admission, he was noted to be incidentally COVID-positive.  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  Discontinue baclofen in favor of Flexeril. -Recent MRI of C-spine with a fluid collection c/w seroma at C2 and C7 -no additional recommendations from neurosurgery -Discussed with Dr. Earlene Plater with ID and agreed not consistent with an infectious process with labs supporting this assessment -ID recommended continuing Maxipime through Monday, October 3 which will be 4 weeks total of this medication.  He will transition to oral Levaquin which will be continued for an additional 28 days as recommended by the infectious disease provider on date of discharge -Currently on methadone and patient plans on following up with methadone clinic after discharge.  Will need to follow QTC while taking both Levaquin and methadone.  On 9/29 QTC 430 ms on 9/30 QTC is 403 ms   Superficial abscess versus seroma C5/C6 -Soft tissue imaging revealed a fluid collection.  Both  ID and neurosurgery reviewed and felt this was not consistent with an infectious process   Insomnia/depression/chronic anxiety -Continue Ambien HS discharge -Per patient request have discontinued Lexapro and Wellbutrin -Continue Vistaril for anxiety   Heroin, polysubstance abuse in context of chronic pain -Continues to lack insight regarding his substance abuse  -Patient wishes to follow-up with methadone clinic after discharge -Has done well on methadone 20 mg daily during the hospitalization   Constipation-narcotic induced -One-time dose of MOM 9/28 with results noted   Covid positive -Asymptomatic and completed quarantine 9/11  Procedures: 2D echocardiogram Cervical discectomy with debridement of epidural abscess and placement of intervertebral lordotic cage  Consultations: Neurosurgery Infectious disease  Antibiotics: Meropenem x1 dose 9/1 Vancomycin IV 9/1 through 9/4 Ceftriaxone 9/2 through 9/4 Cefepime 9/5 >> 10/3 Oral Levaquin 10/3 >>  Discharge Exam: Vitals:   07/19/21 0023 07/19/21 0755  BP: 95/68 111/68  Pulse: 84 84  Resp: 19 18  Temp: 97.6 F (36.4 C) 98 F (36.7 C)  SpO2: 97% 99%   Constitutional: NAD, laying in bed and in no acute distress Respiratory: Lung sounds clear.  Stable on room air.  Normal pulse oximetry Cardiovascular: S1-S2, normotensive, regular pulse without tachycardia, skin warm and dry  Abdomen: Soft nontender nondistended with normal active bowel sounds.  LBM 9/28-eating well Neurologic: CN 2-12 grossly intact. Sensation intact, Strength 5/5 x all 4 extremities.  Psychiatric: Awake and oriented x 3.  Anxious with pressured speech     Discharge Instructions   Discharge Instructions     Ambulatory referral to Infectious Disease   Complete by: As directed    Hospital follow-up for cervical osteomyelitis-Chane is homeless and does not have a telephone.  He is set to establish with [facility]  with first appointment on 10/24.  You can contact their office they can be in touch with this patient.  Their contact number is [phone].  The patient also plans on establishing with the methadone clinic in [place] at [facility] and they would be able to assist with locating the patient as well   Ambulatory referral to Neurosurgery   Complete by: As directed    For routine postoperative follow-up.  Patient is homeless not have phone.  You can likely get in touch with him by calling his primary care provider/[facility] at [phone].  The patient will also be following up with the local methadone clinic through [facility] and you likely can get in contact with him through that clinic if unsuccessful calling the PCP   Call MD for:  persistant dizziness or light-headedness   Complete by: As directed    Call MD for:  redness, tenderness, or signs of infection (pain, swelling, redness, odor or green/yellow discharge around incision site)   Complete by: As directed    Call MD for:  temperature >100.4   Complete by: As directed    Diet general   Complete by: As directed    No wound care   Complete by: As directed       Allergies as of 07/19/2021   No Known Allergies      Medication List     TAKE these medications    acetaminophen 325 MG tablet Commonly known as: TYLENOL Take 2 tablets (650 mg total) by mouth every 6 (six)  hours as needed for mild pain (or Fever >/= 101).   cyclobenzaprine 10 MG tablet Commonly known as: FLEXERIL Take 1 tablet (10 mg total) by mouth 3 (three) times daily.   hydrOXYzine 25 MG tablet Commonly known as: ATARAX/VISTARIL Take 2 tablets (50 mg total) by mouth every 6 (six) hours as needed for anxiety.   levofloxacin 750 MG tablet Commonly known as: LEVAQUIN Take 1 tablet (750 mg total) by mouth daily for 11 days.   pregabalin 100 MG capsule Commonly known as: LYRICA Take 1 capsule (100 mg total) by mouth 3 (three)  times daily.   zolpidem 5 MG tablet Commonly known as: AMBIEN Take 1 tablet (5 mg total) by mouth at bedtime as needed for sleep.       No Known Allergies  Follow-up Information     [facility]. Go to.   Why: You are scheduled for a primary care follow up on Monday, August 09, 2021 at 1:30 pm. Contact information: [address] [place] [address] [phone]                 The results of significant diagnostics from this hospitalization (including imaging, microbiology, ancillary and laboratory) are listed below for reference.    Significant Diagnostic Studies: CT SOFT TISSUE NECK W CONTRAST  Result Date: 06/27/2021 CLINICAL DATA:  Neck abscess, deep tissue epidural abscess s/p drainage with swelling, eval for abscess EXAM: CT NECK WITH CONTRAST TECHNIQUE: Multidetector CT imaging of the neck was performed using the standard protocol following the bolus administration of intravenous contrast. CONTRAST:  50mL OMNIPAQUE IOHEXOL 350 MG/ML SOLN COMPARISON:  None. FINDINGS: Pharynx and larynx: Normal. No mass or swelling. Salivary glands: No inflammation, mass, or stone. Thyroid: Normal. Lymph nodes: None enlarged or abnormal density. Vascular: Negative. Limited intracranial: Negative. Visualized orbits: Negative. Mastoids and visualized paranasal sinuses: Clear. Skeleton: At the C5-C6 level there is a superficial oval 4.1 x 0.4 x 3.6 Cm fluid collection along the skin surface on the right with peripheral enhancement/capsule and internal gas, suspicious for abscess. This collection is contiguous with more ill-defined fluid that tracks posteriorly in the right neck between the thyroid and common carotid artery to the site of C5-C6 ACDF with mild/ill-defined and incomplete peripheral enhancement. This fluid collection extends superiorly along the ventral aspect of the strap musculature to the underside of the submandibular  gland (see series 3, image 61. There is small volume prevertebral edema extending from approximately C2-C7. Upper chest: Visualized lung apices are clear. IMPRESSION: 1. At the C5-C6 level there is a superficial oval 4.1 x 0.4 x 3.6 cm fluid collection along the skin surface on the right with peripheral enhancement/capsule and internal gas, suspicious for abscess given the clinical concern for such. This collection is contiguous with more ill-defined fluid that tracks posteriorly in the right neck to the site of C5-C6 ACDF with mild/ill-defined and incomplete peripheral enhancement, nonspecific but potentially early abscess formation and/or postoperative. This fluid collection extends superiorly along the ventral aspect of the strap musculature to the underside of the submandibular gland. 2. Small volume prevertebral edema extending from approximately C2-C7, which could be postoperative or infectious in etiology. An MRI of the cervical spine with contrast could better evaluate the spine/canal if clinically indicated. Findings discussed with Doran Durand, NP via telephone at 6:05 p.m. Electronically Signed   By: Feliberto Harts M.D.   On: 06/27/2021 18:08   MR CERVICAL SPINE W WO CONTRAST  Result Date: 07/05/2021  CLINICAL DATA:  Cervical radiculopathy, prior C-spine surgery EXAM: MRI CERVICAL SPINE WITHOUT AND WITH CONTRAST TECHNIQUE: Multiplanar and multiecho pulse sequences of the cervical spine, to include the craniocervical junction and cervicothoracic junction, were obtained without and with intravenous contrast. CONTRAST:  35mL GADAVIST GADOBUTROL 1 MMOL/ML IV SOLN COMPARISON:  MRI cervical spine 06/17/2021.  CT neck 06/27/2021. FINDINGS: Alignment: Similar alignment with straightening of the normal cervical lordosis. No substantial sagittal subluxation. Vertebrae: C5-C6 ACDF. Metallic artifact limits evaluation at this level without obvious marrow edema to suggest acute fracture or osteomyelitis. No suspicious  bone lesions. Cord: Normal cord signal. Posterior Fossa, vertebral arteries, paraspinal tissues: Increased prevertebral edema/thickening and enhancement which extends more cranially to approximately C2 and caudally to C7, likely infectious phlegmon. More superficial fluid collections in the right neck were better characterized on recent CT neck. Visualized vertebral artery flow voids are maintained. No obvious acute abnormality in the visualized posterior fossa. Disc levels: C2-C3: No significant disc protrusion, foraminal stenosis, or canal stenosis. C3-C4: Mild uncovertebral hypertrophy without significant canal or foraminal stenosis. C4-C5: Mild right greater than left uncovertebral hypertrophy. Previously seen enhancing soft tissue within the ventral aspect of the canal has essentially resolved at this level. As a result, canal stenosis is improved, not significant this level. Similar mild right foraminal stenosis. C5-C6: ACDF. There is improved, but persistent enhancing soft tissue within the ventral epidural canal. Resulting effacement of the ventral CSF with overall mild canal stenosis, improved. Enhancing phlegmon extends into bilateral foramina without significant bony foraminal stenosis. C6-C7: Right eccentric posterior disc osteophyte complex. Slightly improved epidural enhancement ventrally. Right greater than left uncovertebral hypertrophy. No significant change in mild canal stenosis and moderate right foraminal stenosis. C7-T1: No significant disc protrusion, foraminal stenosis, or canal stenosis. Mild facet arthropathy. IMPRESSION: 1. Epidural enhancement within the ventral canal at C4-C6 is persistent but improved status post ACDF. Associated canal stenosis at C5-C6 is improved, now mild. Enhancing phlegmon probably extends into bilateral foramina without significant bony foraminal stenosis at this level. 2. Increased prevertebral edema/thickening and enhancement which extends more cranially to  approximately C2 and caudally to C7, likely infectious phlegmon. More superficial fluid collections in the right neck were better characterized on recent CT neck. 3. At C6-C7, no substantial change in moderate right foraminal stenosis and mild canal stenosis. Electronically Signed   By: Feliberto Harts M.D.   On: 07/05/2021 12:56   ECHOCARDIOGRAM LIMITED  Result Date: 06/19/2021    ECHOCARDIOGRAM LIMITED REPORT   Patient Name:   Gerald Oliver Date of Exam: 06/19/2021 Medical Rec #:  [id number]        Height:       69.0 in Accession #:    [id number]       Weight:       142.4 lb Date of Birth:  Feb 15, 1991        BSA:          1.788 m Patient Age:    30 years         BP:           127/71 mmHg Patient Gender: M                HR:           75 bpm. Exam Location:  Inpatient Procedure: 2D Echo, Cardiac Doppler and Color Doppler Indications:    Endocardititis  History:        Patient has prior history of Echocardiogram examinations, most  recent 05/07/2018. Polysubstance abuse. Sepsis. COVID-19.  Sonographer:    Ross Ludwig RDCS (AE) Referring Phys: [id number] [facility] IMPRESSIONS  1. Left ventricular ejection fraction, by estimation, is 70 to 75%. The left ventricle has hyperdynamic function. The left ventricle has no regional wall motion abnormalities. There is mild concentric left ventricular hypertrophy. Left ventricular diastolic parameters were normal.  2. Right ventricular systolic function is normal. The right ventricular size is normal.  3. The mitral valve is normal in structure. No evidence of mitral valve regurgitation. No evidence of mitral stenosis.  4. The aortic valve is normal in structure. Aortic valve regurgitation is not visualized. No aortic stenosis is present.  5. The inferior vena cava is normal in size with greater than 50% respiratory variability, suggesting right atrial pressure of 3 mmHg. Conclusion(s)/Recommendation(s): No evidence of valvular vegetations on this  transthoracic echocardiogram. Would recommend a transesophageal echocardiogram to exclude infective endocarditis if clinically indicated. FINDINGS  Left Ventricle: Left ventricular ejection fraction, by estimation, is 70 to 75%. The left ventricle has hyperdynamic function. The left ventricle has no regional wall motion abnormalities. The left ventricular internal cavity size was normal in size. There is mild concentric left ventricular hypertrophy. Left ventricular diastolic parameters were normal. Right Ventricle: The right ventricular size is normal. No increase in right ventricular wall thickness. Right ventricular systolic function is normal. Left Atrium: Left atrial size was normal in size. Right Atrium: Right atrial size was normal in size. Pericardium: There is no evidence of pericardial effusion. Mitral Valve: The mitral valve is normal in structure. No evidence of mitral valve stenosis. Tricuspid Valve: The tricuspid valve is normal in structure. Tricuspid valve regurgitation is trivial. No evidence of tricuspid stenosis. Aortic Valve: The aortic valve is normal in structure. Aortic valve regurgitation is not visualized. No aortic stenosis is present. Pulmonic Valve: The pulmonic valve was normal in structure. Pulmonic valve regurgitation is not visualized. No evidence of pulmonic stenosis. Aorta: The aortic root is normal in size and structure. Venous: The inferior vena cava is normal in size with greater than 50% respiratory variability, suggesting right atrial pressure of 3 mmHg. IAS/Shunts: No atrial level shunt detected by color flow Doppler. LEFT VENTRICLE PLAX 2D LVIDd:         4.00 cm  Diastology LVIDs:         2.20 cm  LV e' medial:    11.10 cm/s LV PW:         1.30 cm  LV E/e' medial:  8.7 LV IVS:        1.20 cm  LV e' lateral:   17.70 cm/s LVOT diam:     2.20 cm  LV E/e' lateral: 5.4 LVOT Area:     3.80 cm  IVC IVC diam: 1.30 cm LEFT ATRIUM         Index LA diam:    3.50 cm 1.96 cm/m   AORTA  Ao Root diam: 3.10 cm Ao Asc diam:  2.80 cm MITRAL VALVE MV Area (PHT): 3.31 cm    SHUNTS MV Decel Time: 229 msec    Systemic Diam: 2.20 cm MV E velocity: 96.40 cm/s MV A velocity: 66.60 cm/s MV E/A ratio:  1.45 Donato Schultz MD Electronically signed by Donato Schultz MD Signature Date/Time: 06/19/2021/5:10:54 PM    Final     Microbiology: No results found for this or any previous visit (from the past 240 hour(s)).   Labs: Basic Metabolic Panel: Recent Labs  Lab 07/15/21 0048  NA  136  K 3.5  CL 100  CO2 26  GLUCOSE 79  BUN 26*  CREATININE 0.81  CALCIUM 9.4   Liver Function Tests: No results for input(s): AST, ALT, ALKPHOS, BILITOT, PROT, ALBUMIN in the last 168 hours. No results for input(s): LIPASE, AMYLASE in the last 168 hours. No results for input(s): AMMONIA in the last 168 hours. CBC: Recent Labs  Lab 07/15/21 0048  WBC 5.3  NEUTROABS 1.8  HGB 12.4*  HCT 38.0*  MCV 81.0  PLT 153   Cardiac Enzymes: No results for input(s): CKTOTAL, CKMB, CKMBINDEX, TROPONINI in the last 168 hours. BNP: BNP (last 3 results) No results for input(s): BNP in the last 8760 hours.  ProBNP (last 3 results) No results for input(s): PROBNP in the last 8760 hours.  CBG: No results for input(s): GLUCAP in the last 168 hours.     Signed:  Junious Silk ANP [facility] 07/19/2021, 10:35 AM  35

## 2021-07-19 NOTE — TOC Transition Note (Signed)
Transition of Care [facility]) - CM/SW Discharge Note   Patient Details  Name: AUSTIN HERD MRN: [id number] Date of Birth: [birth year]  Transition of Care [facility] CM/SW Contact:  Curlene Labrum, RN Phone Number: 07/19/2021, 11:38 AM   Clinical Narrative:    Case management met with the patient at the bedside to discuss transitions of care and plan for discharge with brother today.  The patient plans to follow up with [facility] [address] in [place], [place] and follow up placed in the discharge instructions.  Discharge summary was faxed to Jerral Ralph (fax # [phone]) at [facility] since the facility is going to be offering drug treatment for the patient- patient is aware.  The patient was provided a MATCH for [place] earlier in the year.  The patient is homeless and does not have cash payment availability.  I spoke with Nicole [facility] in [place] and the prescriptions will be provided to the patient upon discharge today.    The patient was given jacket and warm clothing through [facility].  The patient will be discharged today and Caryl Pina, RN at the bedside plans to deliver the [facility] medications to the patient when the patient brother arrives.   Final next level of care: Homeless Shelter Barriers to Discharge: No Barriers Identified   Patient Goals and CMS Choice Patient states their goals for this hospitalization and ongoing recovery are:: Patient plans to discharge to homeless shelter today - brother is providing transportation. CMS Medicare.gov Compare Post Acute Care list provided to:: Patient Choice offered to / list presented to : Patient  Discharge Placement                       Discharge Plan and Services In-house Referral: Clinical Social Work Discharge Planning Services: CM Consult, Follow-up appt scheduled Post Acute Care Choice:  ([facility] in [place] for follow up.)                               Social Determinants of Health  ([place]) Interventions     Readmission Risk Interventions No flowsheet data found.

## 2021-08-03 ENCOUNTER — Inpatient Hospital Stay: Payer: Self-pay | Admitting: Internal Medicine

## 2021-08-03 NOTE — Progress Notes (Deleted)
[place] [facility]  CHIEF COMPLAINT:    Follow up for epidural abscess  SUBJECTIVE:    Gerald Oliver is a 30 y.o. male with PMHx as below who presents to the clinic for epidural abscess.   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  MRI at admission showed soft tissue infection with epidural/paraspinal phlegmon at the C4-6 level.  On 06/18/21 he underwent anterior cervical decompression/discectomy with fusion C5-6 by NSGY.  He was treated with cefepime x 4 weeks inpatient then transitioned to Levaquin for an additional 4 weeks at recommendation of Dr Baxter Flattery to complete 8 weeks total.  ESR on 9/22 was 15 and CRP 1.7.  QTc on 9/30 was 405.  He will complete his Levaquin on 08/16/21.  Please see A&P for the details of today's visit and status of the patient's medical problems.   Patient's Medications  New Prescriptions   No medications on file  Previous Medications   ACETAMINOPHEN (TYLENOL) 325 MG TABLET    Take 2 tablets (650 mg total) by mouth every 6 (six) hours as needed for mild pain (or Fever >/= 101).   CYCLOBENZAPRINE (FLEXERIL) 10 MG TABLET    Take 1 tablet (10 mg total) by mouth 3 (three) times daily.   HYDROXYZINE (ATARAX/VISTARIL) 25 MG TABLET    Take 2 tablets (50 mg total) by mouth every 6 (six) hours as needed for anxiety.   LEVOFLOXACIN (LEVAQUIN) 750 MG TABLET    Take 1 tablet (750 mg total) by mouth daily for 28 days.   PREGABALIN (LYRICA) 100 MG CAPSULE    Take 1 capsule (100 mg total) by mouth 3 (three) times daily.   ZOLPIDEM (AMBIEN) 5 MG TABLET    Take 1 tablet (5 mg total) by mouth at bedtime as needed for sleep.  Modified Medications   No medications on file  Discontinued Medications   No medications on file      Past Medical History:  Diagnosis Date   Accidental overdose of heroin ([place]) 02/2021   Heroin dependence ([place])     Social History   Tobacco Use   Smoking  status: Some Days    Types: Cigarettes   Smokeless tobacco: Never  Substance Use Topics   Alcohol use: Yes    Comment: rarely   Drug use: Yes    Types: Heroin, Marijuana    Comment: heroin    Family History  Problem Relation Age of Onset   Cancer Mother     No Known Allergies  ROS   OBJECTIVE:    There were no vitals filed for this visit. There is no height or weight on file to calculate BMI.  Physical Exam   Labs and Microbiology: CBC Latest Ref Rng & Units 07/15/2021 07/08/2021 07/05/2021  WBC 4.0 - 10.5 K/uL 5.3 4.9 6.8  Hemoglobin 13.0 - 17.0 g/dL 12.4(L) 11.7(L) 12.1(L)  Hematocrit 39.0 - 52.0 % 38.0(L) 34.7(L) 38.0(L)  Platelets 150 - 400 K/uL 153 144(L) 193   CMP Latest Ref Rng & Units 07/15/2021 07/08/2021 07/01/2021  Glucose 70 - 99 mg/dL 79 120(H) 108(H)  BUN 6 - 20 mg/dL 26(H) 29(H) 33(H)  Creatinine 0.61 - 1.24 mg/dL 0.81 0.72 0.72  Sodium 135 - 145 mmol/L 136 138 138  Potassium 3.5 - 5.1 mmol/L 3.5 4.3 4.7  Chloride 98 - 111 mmol/L 100 103 101  CO2 22 - 32 mmol/L '26 27 28  ' Calcium  8.9 - 10.3 mg/dL 9.4 9.4 9.7  Total Protein 6.5 - 8.1 g/dL - - -  Total Bilirubin 0.3 - 1.2 mg/dL - - -  Alkaline Phos 38 - 126 U/L - - -  AST 15 - 41 U/L - - -  ALT 0 - 44 U/L - - -     No results found for this or any previous visit (from the past 240 hour(s)).  Imaging: MRI cervical spine 9/19 IMPRESSION: 1. Epidural enhancement within the ventral canal at C4-C6 is persistent but improved status post ACDF. Associated canal stenosis at C5-C6 is improved, now mild. Enhancing phlegmon probably extends into bilateral foramina without significant bony foraminal stenosis at this level. 2. Increased prevertebral edema/thickening and enhancement which extends more cranially to approximately C2 and caudally to C7, likely infectious phlegmon. More superficial fluid collections in the right neck were better characterized on recent CT neck. 3. At C6-C7, no substantial change  in moderate right foraminal stenosis and mild canal stenosis.   ASSESSMENT & PLAN:    No problem-specific Assessment & Plan notes found for this encounter.   No orders of the defined types were placed in this encounter.    Epidural abscess and OM: He is s/p C5-6 ACDF (instrumented fusion) with NSGY on 9/2 and OR cultures grew Serratia marcescens.  Received 4 weeks of cefepime IV in the hospital and discharged 07/19/21 to complete 4 more weeks of oral Levaquin.  He is taking this currently without issues and antibiotics will conclude on 08/16/21.  He had a follow up MRI done on 9/19 that showed improved overall improvement.  Will continue Levaquin as planned to complete 8 weeks of therapy and check ESR, CRP, CBC, BMP today.  Check EKG.     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  Will plan to recheck this again in 3 months to ensure remains clear given his history of IVDU.  Opiate use disorder: Now following at the methadone clinic.    Rayn[facility] 08/03/2021, 5:49 AM

## 2021-08-09 ENCOUNTER — Inpatient Hospital Stay (INDEPENDENT_AMBULATORY_CARE_PROVIDER_SITE_OTHER): Payer: Self-pay | Admitting: Primary Care

## 2022-09-07 ENCOUNTER — Emergency Department (HOSPITAL_COMMUNITY)
Admission: EM | Admit: 2022-09-07 | Discharge: 2022-09-07 | Disposition: A | Discharge status: Home / Self Care | Payer: Self-pay | Attending: Emergency Medicine | Admitting: Emergency Medicine

## 2022-09-07 DIAGNOSIS — K0889 Other specified disorders of teeth and supporting structures: Secondary | ICD-10-CM | POA: Insufficient documentation

## 2022-09-07 MED ORDER — AMOXICILLIN 500 MG PO CAPS
500.0000 mg | ORAL_CAPSULE | Freq: Once | ORAL | Status: AC
  Administered 2022-09-07: 500 mg via ORAL
  Filled 2022-09-07: qty 1

## 2022-09-07 MED ORDER — KETOROLAC TROMETHAMINE 30 MG/ML IJ SOLN
30.0000 mg | Freq: Once | INTRAMUSCULAR | Status: AC
  Administered 2022-09-07: 30 mg via INTRAMUSCULAR
  Filled 2022-09-07: qty 1

## 2022-09-07 MED ORDER — AMOXICILLIN 500 MG PO CAPS: 500.0000 mg | ORAL_CAPSULE | Freq: Three times a day (TID) | ORAL | 0 refills | Status: DC

## 2022-09-07 NOTE — ED Provider Notes (Signed)
[facility] EMERGENCY DEPARTMENT Provider Note   CSN: [id number] Arrival date & time: 09/07/22  1110     History  Chief Complaint  Patient presents with   Dental Pain    Gerald Oliver is a 31 y.o. male.  HPI Patient presents with dental pain.  Patient is awake, alert, that has previously been made aware of dental issues, requiring follow-up, but has not done so.  He has known broken teeth in the right posterior superior, and right paramidline regions.  No fever, vomiting, no relief with Tylenol ibuprofen.  He has not seen a dentist.  He is homeless.    Home Medications Prior to Admission medications   Medication Sig Start Date End Date Taking? Authorizing Provider  amoxicillin (AMOXIL) 500 MG capsule Take 1 capsule (500 mg total) by mouth 3 (three) times daily. 09/07/22  Yes Gerhard Munch, MD  acetaminophen (TYLENOL) 325 MG tablet Take 2 tablets (650 mg total) by mouth every 6 (six) hours as needed for mild pain (or Fever >/= 101). 07/19/21   Russella Dar, NP  cyclobenzaprine (FLEXERIL) 10 MG tablet Take 1 tablet (10 mg total) by mouth 3 (three) times daily. 07/19/21   Russella Dar, NP  hydrOXYzine (ATARAX/VISTARIL) 25 MG tablet Take 2 tablets (50 mg total) by mouth every 6 (six) hours as needed for anxiety. 07/19/21   Russella Dar, NP  pregabalin (LYRICA) 100 MG capsule Take 1 capsule (100 mg total) by mouth 3 (three) times daily. 07/19/21   Russella Dar, NP  zolpidem (AMBIEN) 5 MG tablet Take 1 tablet (5 mg total) by mouth at bedtime as needed for sleep. 07/19/21   Russella Dar, NP      Allergies    Patient has no known allergies.    Review of Systems   Review of Systems  All other systems reviewed and are negative.   Physical Exam Updated Vital Signs BP (!) 144/109 (BP Location: Right Arm)   Pulse (!) 128   Temp 98.1 F (36.7 C)   Resp 16   SpO2 97%  Physical Exam Vitals and nursing note reviewed.  Constitutional:       General: He is not in acute distress.    Appearance: He is well-developed.  HENT:     Head: Normocephalic and atraumatic.     Mouth/Throat:   Eyes:     Conjunctiva/sclera: Conjunctivae normal.  Pulmonary:     Effort: No respiratory distress.     Breath sounds: No stridor.  Abdominal:     General: There is no distension.  Skin:    General: Skin is warm and dry.  Neurological:     Mental Status: He is alert and oriented to person, place, and time.  Psychiatric:     Comments: Anxious     ED Results / Procedures / Treatments   Labs (all labs ordered are listed, but only abnormal results are displayed) Labs Reviewed - No data to display  EKG None  Radiology No results found.  Procedures Procedures    Medications Ordered in ED Medications  amoxicillin (AMOXIL) capsule 500 mg (has no administration in time range)  ketorolac (TORADOL) 30 MG/ML injection 30 mg (has no administration in time range)    ED Course/ Medical Decision Making/ A&P                           Medical Decision Making Homeless adult male presents with  mouth pain, has dental caries, 1 broken tooth, no obvious abscess, no purulence, no bleeding.  No asymmetry in his oropharynx, no fever, patient is tachycardic, though he has pain, low suspicion for bacteremia, Sirs, sepsis.  Patient started on antibiotics, provided dental follow-up.  Risk OTC drugs. Prescription drug management. Diagnosis or treatment significantly limited by social determinants of health.          Final Clinical Impression(s) / ED Diagnoses Final diagnoses:  Pain, dental    Rx / DC Orders ED Discharge Orders          Ordered    amoxicillin (AMOXIL) 500 MG capsule  3 times daily        09/07/22 1154              Gerhard Munch, MD 09/07/22 1159

## 2022-09-07 NOTE — ED Triage Notes (Signed)
Patient here with complaint of dental pain and cavities in multiple locations of mouth. Patient is homeless, sees a medical provider that offers services to homeless but they do not have a Education officer, community.

## 2022-09-07 NOTE — Discharge Instructions (Addendum)
As discussed, it is important that he follow-up with either our dentist or another dentist if possible within the next week.  Return here for concerning changes.  Please obtain and take your antibiotics and continue using your mouth wash.

## 2022-10-24 ENCOUNTER — Encounter (HOSPITAL_COMMUNITY): Payer: Self-pay

## 2022-10-24 ENCOUNTER — Other Ambulatory Visit: Payer: Self-pay

## 2022-10-24 ENCOUNTER — Emergency Department (HOSPITAL_COMMUNITY)
Admission: EM | Admit: 2022-10-24 | Discharge: 2022-10-24 | Disposition: A | Discharge status: Home / Self Care | Payer: Medicaid Other | Attending: Emergency Medicine | Admitting: Emergency Medicine

## 2022-10-24 DIAGNOSIS — K0889 Other specified disorders of teeth and supporting structures: Secondary | ICD-10-CM | POA: Diagnosis not present

## 2022-10-24 MED ORDER — NAPROXEN 375 MG PO TABS: 375.0000 mg | ORAL_TABLET | Freq: Two times a day (BID) | ORAL | 0 refills | Status: AC

## 2022-10-24 MED ORDER — KETOROLAC TROMETHAMINE 15 MG/ML IJ SOLN
15.0000 mg | Freq: Once | INTRAMUSCULAR | Status: AC
  Administered 2022-10-24: 15 mg via INTRAMUSCULAR
  Filled 2022-10-24: qty 1

## 2022-10-24 MED ORDER — AMOXICILLIN-POT CLAVULANATE 875-125 MG PO TABS: 1.0000 | ORAL_TABLET | Freq: Two times a day (BID) | ORAL | 0 refills | Status: AC

## 2022-10-24 NOTE — ED Provider Notes (Signed)
[facility] EMERGENCY DEPARTMENT Provider Note   CSN: [id number] Arrival date & time: 10/24/22  1017     History  Chief Complaint  Patient presents with   Dental Pain    Gerald Oliver is a 32 y.o. male.  32 year old male presents for evaluation of dental pain.  He states this has been ongoing for some time however it is worse in the past day or 2.  He denies fever, swelling, drainage.  Has long history of poor dentition.  Does not have a dentist.  States he is coming in for dental referral.  Denies difficulty swallowing, has not been drooling.  The history is provided by the patient. No language interpreter was used.       Home Medications Prior to Admission medications   Medication Sig Start Date End Date Taking? Authorizing Provider  acetaminophen (TYLENOL) 325 MG tablet Take 2 tablets (650 mg total) by mouth every 6 (six) hours as needed for mild pain (or Fever >/= 101). 07/19/21   Samella Parr, NP  amoxicillin (AMOXIL) 500 MG capsule Take 1 capsule (500 mg total) by mouth 3 (three) times daily. 09/07/22   Carmin Muskrat, MD  cyclobenzaprine (FLEXERIL) 10 MG tablet Take 1 tablet (10 mg total) by mouth 3 (three) times daily. 07/19/21   Samella Parr, NP  hydrOXYzine (ATARAX/VISTARIL) 25 MG tablet Take 2 tablets (50 mg total) by mouth every 6 (six) hours as needed for anxiety. 07/19/21   Samella Parr, NP  pregabalin (LYRICA) 100 MG capsule Take 1 capsule (100 mg total) by mouth 3 (three) times daily. 07/19/21   Samella Parr, NP  zolpidem (AMBIEN) 5 MG tablet Take 1 tablet (5 mg total) by mouth at bedtime as needed for sleep. 07/19/21   Samella Parr, NP      Allergies    Patient has no known allergies.    Review of Systems   Review of Systems  Constitutional:  Negative for fever.  HENT:  Positive for dental problem. Negative for drooling, sore throat and trouble swallowing.   All other systems reviewed and are negative.   Physical  Exam Updated Vital Signs Pulse 70   Temp 98.4 F (36.9 C) (Oral)   Resp 20   Ht 5\' 9"  (1.753 m)   Wt 72.6 kg   SpO2 (!) 1%   BMI 23.63 kg/m  Physical Exam Vitals and nursing note reviewed.  Constitutional:      General: He is not in acute distress.    Appearance: Normal appearance. He is not ill-appearing.  HENT:     Head: Normocephalic and atraumatic.     Nose: Nose normal.     Mouth/Throat:     Comments: Poor dentition noted primarily on upper and lower molars bilaterally.  No evidence of dental abscess, facial swelling, retropharyngeal abscess, Ludwig's angina, peritonsillar abscess.  Without trismus. Eyes:     Conjunctiva/sclera: Conjunctivae normal.  Cardiovascular:     Rate and Rhythm: Normal rate.  Pulmonary:     Effort: Pulmonary effort is normal. No respiratory distress.  Musculoskeletal:        General: No deformity.  Skin:    Findings: No rash.  Neurological:     Mental Status: He is alert.     ED Results / Procedures / Treatments   Labs (all labs ordered are listed, but only abnormal results are displayed) Labs Reviewed - No data to display  EKG None  Radiology No results found.  Procedures Procedures    Medications Ordered in ED Medications  ketorolac (TORADOL) 15 MG/ML injection 15 mg (has no administration in time range)    ED Course/ Medical Decision Making/ A&P                           Medical Decision Making Risk Prescription drug management.   32 year old male presents today for evaluation of dental pain.  No evidence of retropharyngeal abscess, Ludwig's angina, dental abscess.  Overall well-appearing.  Will give Toradol for pain.  Will prescribe naproxen, Augmentin.  Patient is otherwise appropriate for discharge.  Dental resource and dental referral to on-call dentist given.  Patient is appropriate for discharge.  Discharged in stable condition peer return precautions discussed.   Final Clinical Impression(s) / ED  Diagnoses Final diagnoses:  Pain, dental    Rx / DC Orders ED Discharge Orders          Ordered    amoxicillin-clavulanate (AUGMENTIN) 875-125 MG tablet  Every 12 hours        10/24/22 1242    naproxen (NAPROSYN) 375 MG tablet  2 times daily        10/24/22 1242              Marita Kansas, PA-C 10/24/22 1303    Tegeler, Canary Brim, MD 10/24/22 [phone]

## 2022-10-24 NOTE — ED Notes (Signed)
Pt provided discharge instructions and prescription information. Pt was given the opportunity to ask questions and questions were answered.   

## 2022-10-24 NOTE — ED Triage Notes (Signed)
Pt c/o dental pain bilateral. Pt states he has several teeth with the nerve exposed.

## 2022-10-24 NOTE — Discharge Instructions (Addendum)
Your exam today was overall reassuring.  I have sent antibiotic into the pharmacy for you.  However this is a temporary still need to go to a dentist.  I have given you information for your our on-call dentist as well as additional dental resources.  You got a shot of Toradol in the emergency department which is an anti-inflammatory.  I also sent in prescription for naproxen into your pharmacy.  Any concerning symptoms return to the emergency department.

## 2023-07-12 IMAGING — MR MR CERVICAL SPINE WO/W CM
4 of 8 series · 18 of 48 positions shown · IV contrast (Yes   MULTIHANCE)
Comparison: MRI cervical spine 06/17/2021.  CT neck 06/27/2021.

CLINICAL DATA: Cervical radiculopathy, prior C-spine surgery

EXAM:
MRI CERVICAL SPINE WITHOUT AND WITH CONTRAST
TECHNIQUE: Multiplanar and multiecho pulse sequences of the cervical spine, to
include the craniocervical junction and cervicothoracic junction,
were obtained without and with intravenous contrast.
CONTRAST:  7mL GADAVIST GADOBUTROL 1 MMOL/ML IV SOLN

[Series 2: T2 · sagittal · 3.0mm · 0.43mm/px · 4 of 16 slices shown (1 of 2)]
[im 1/16]
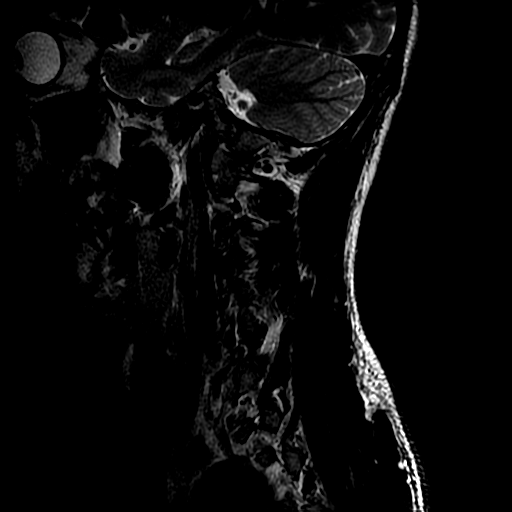
[im 6/16]
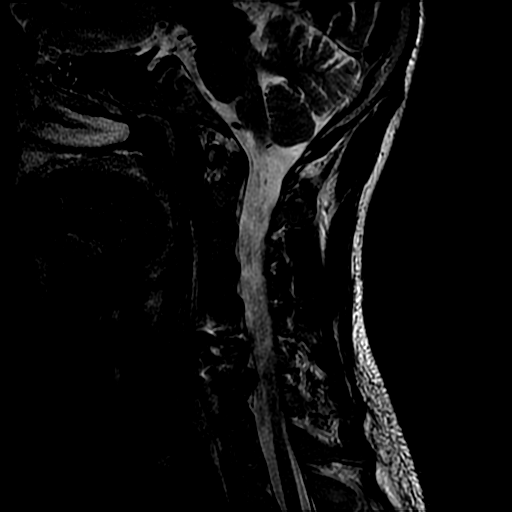
[im 11/16]
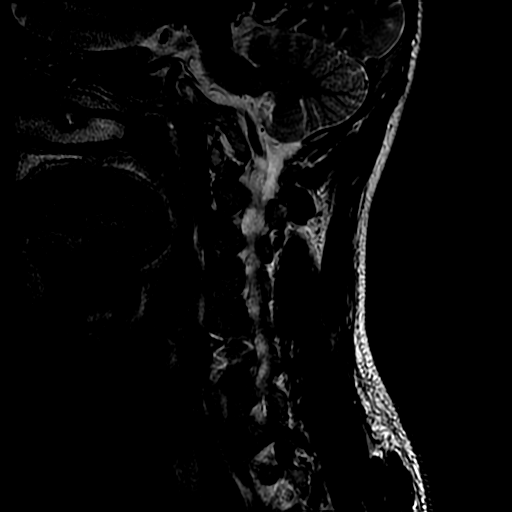
[im 16/16]
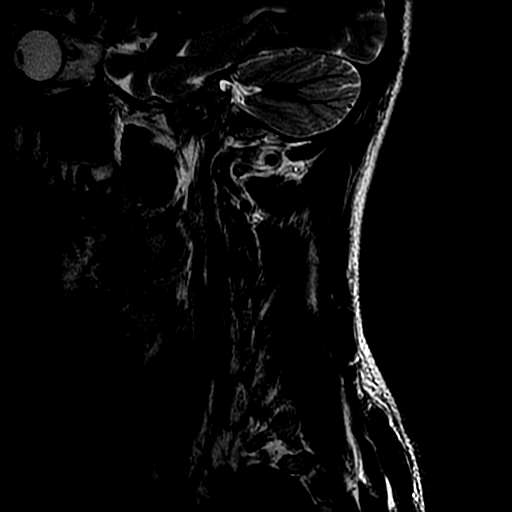

[Series 4: T1 · sagittal · 3.0mm · 0.43mm/px · 3 of 16 slices shown (1 of 2)]
[im 1/16]
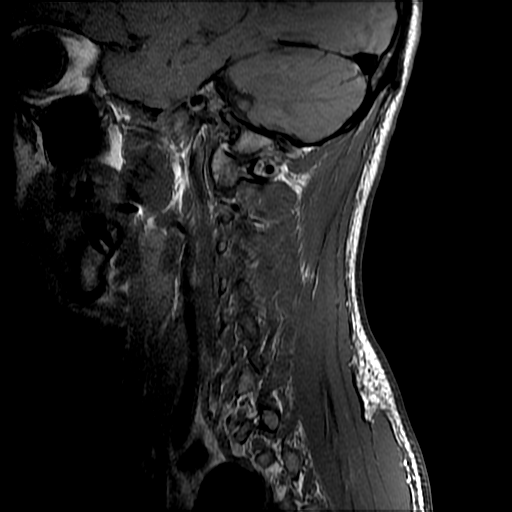
[im 11/16]
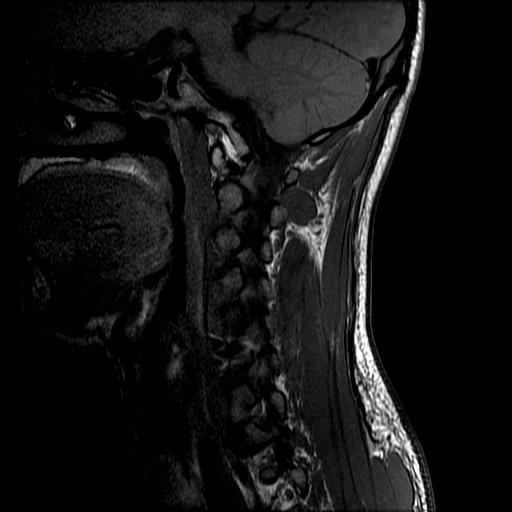
[im 16/16]
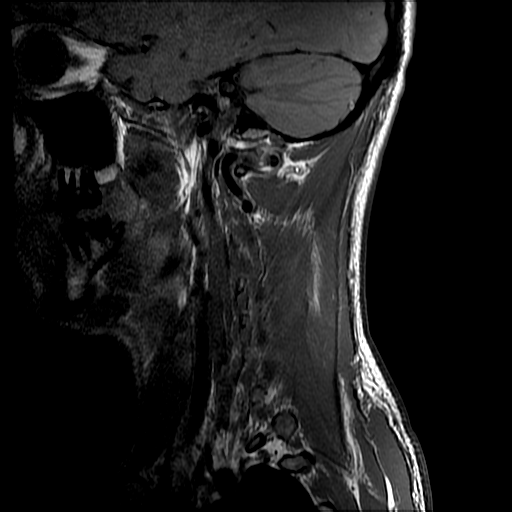

[Series 6: T2 · axial · 3.0mm · 0.39mm/px · z∈[-52,+38]mm · 8 of 28 slices shown (2 of 2)]
[im 1/28]
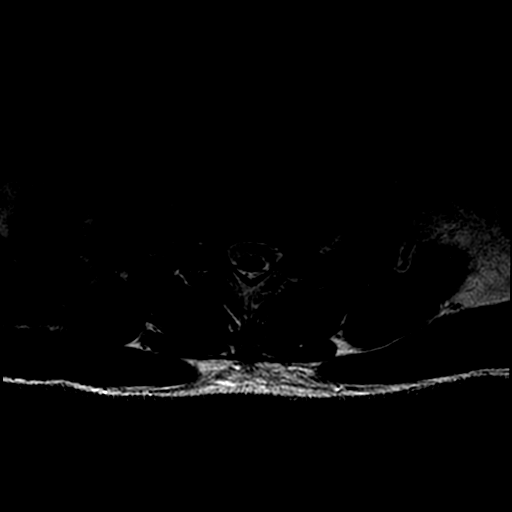
[im 4/28]
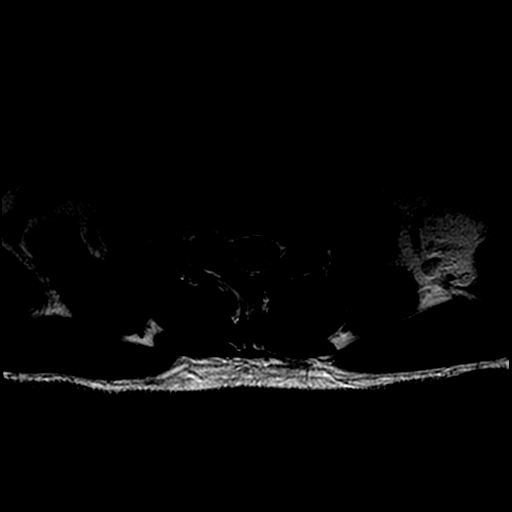
[im 8/28]
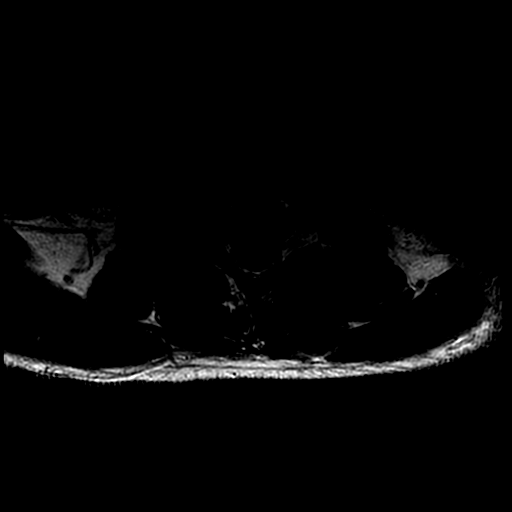
[im 12/28]
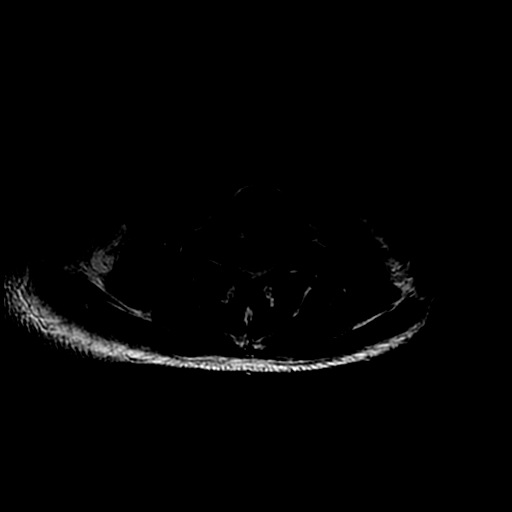
[im 16/28]
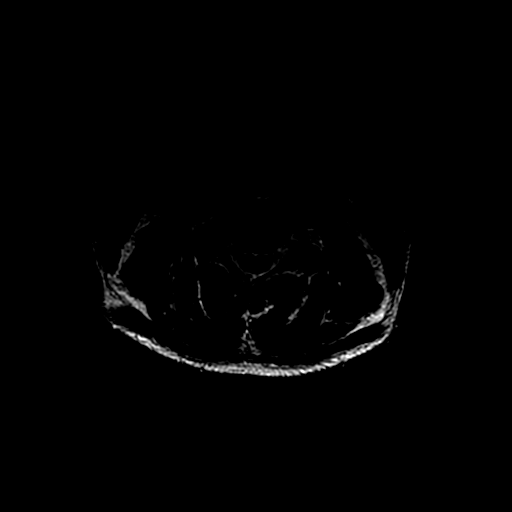
[im 20/28]
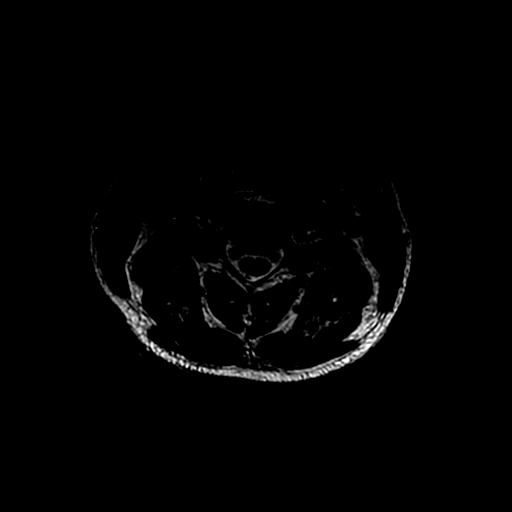
[im 24/28]
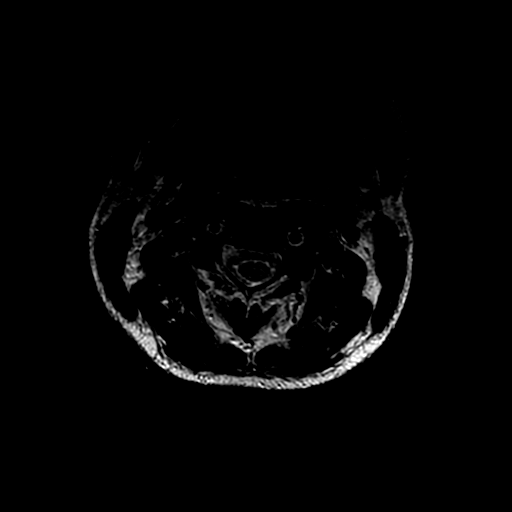
[im 28/28]
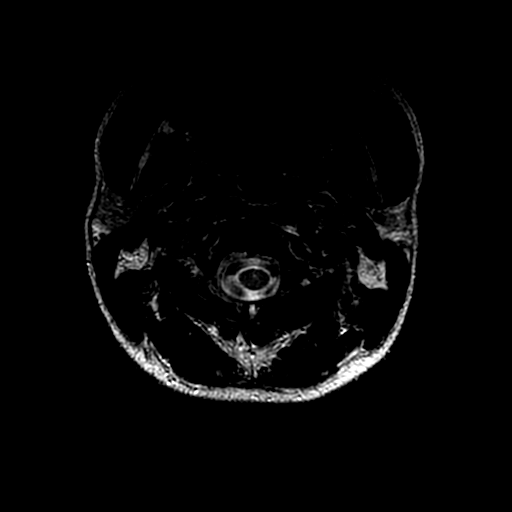

[Series 7: T1 · axial · non-contrast · 3.0mm · 0.39mm/px · z∈[-42,+24]mm · 3 of 28 slices shown (2 of 2)]
[im 4/28]
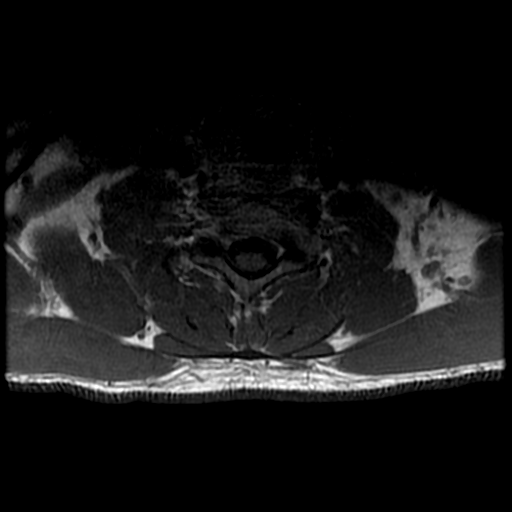
[im 16/28]
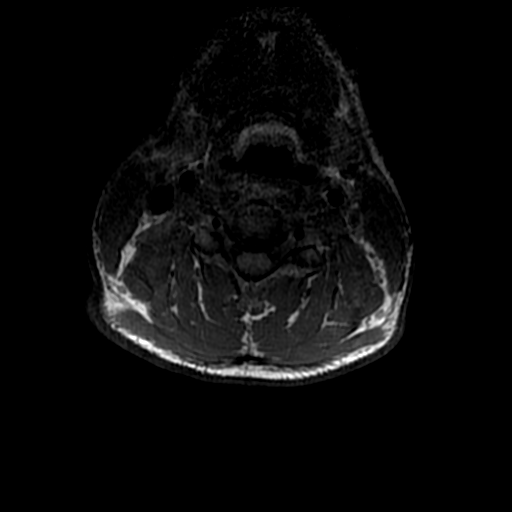
[im 24/28]
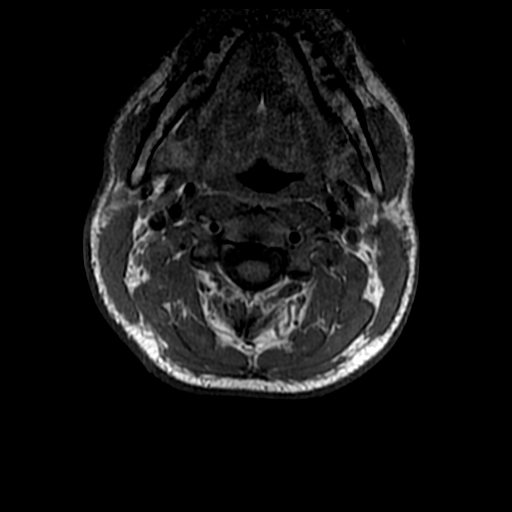

[18 of 48 positions shown; findings below may reference images not displayed]

FINDINGS: Alignment: Similar alignment with straightening of the normal
cervical lordosis. No substantial sagittal subluxation.

Vertebrae: C5-C6 ACDF. Metallic artifact limits evaluation at this
level without obvious marrow edema to suggest acute fracture or
osteomyelitis. No suspicious bone lesions.

Cord: Normal cord signal.

Posterior Fossa, vertebral arteries, paraspinal tissues: Increased
prevertebral edema/thickening and enhancement which extends more
cranially to approximately C2 and caudally to C7, likely infectious
phlegmon. More superficial fluid collections in the right neck were
better characterized on recent CT neck. Visualized vertebral artery
flow voids are maintained. No obvious acute abnormality in the
visualized posterior fossa.

Disc levels:

C2-C3: No significant disc protrusion, foraminal stenosis, or canal
stenosis.

C3-C4: Mild uncovertebral hypertrophy without significant canal or
foraminal stenosis.

C4-C5: Mild right greater than left uncovertebral hypertrophy.
Previously seen enhancing soft tissue within the ventral aspect of
the canal has essentially resolved at this level. As a result, canal
stenosis is improved, not significant this level. Similar mild right
foraminal stenosis.

C5-C6: ACDF. There is improved, but persistent enhancing soft tissue
within the ventral epidural canal. Resulting effacement of the
ventral CSF with overall mild canal stenosis, improved. Enhancing
phlegmon extends into bilateral foramina without significant bony
foraminal stenosis.

C6-C7: Right eccentric posterior disc osteophyte complex. Slightly
improved epidural enhancement ventrally. Right greater than left
uncovertebral hypertrophy. No significant change in mild canal
stenosis and moderate right foraminal stenosis.

C7-T1: No significant disc protrusion, foraminal stenosis, or canal
stenosis. Mild facet arthropathy.
IMPRESSION: 1. Epidural enhancement within the ventral canal at C4-C6 is
persistent but improved status post ACDF. Associated canal stenosis
at C5-C6 is improved, now mild. Enhancing phlegmon probably extends
into bilateral foramina without significant bony foraminal stenosis
at this level.
2. Increased prevertebral edema/thickening and enhancement which
extends more cranially to approximately C2 and caudally to C7,
likely infectious phlegmon. More superficial fluid collections in
the right neck were better characterized on recent CT neck.
3. At C6-C7, no substantial change in moderate right foraminal
stenosis and mild canal stenosis.
